# Patient Record
Sex: Male | Born: 1955 | Race: White | Hispanic: No | Marital: Married | Smoking: Former smoker
Health system: Southern US, Community
[De-identification: ages and names within clinical notes are randomized; demographics above are authoritative.]

## PROBLEM LIST (undated history)

## (undated) DIAGNOSIS — I350 Nonrheumatic aortic (valve) stenosis: Secondary | ICD-10-CM

## (undated) DIAGNOSIS — I509 Heart failure, unspecified: Secondary | ICD-10-CM

## (undated) DIAGNOSIS — I2510 Atherosclerotic heart disease of native coronary artery without angina pectoris: Secondary | ICD-10-CM

## (undated) DIAGNOSIS — Q2381 Bicuspid aortic valve: Secondary | ICD-10-CM

## (undated) DIAGNOSIS — Z951 Presence of aortocoronary bypass graft: Secondary | ICD-10-CM

## (undated) DIAGNOSIS — Q231 Congenital insufficiency of aortic valve: Secondary | ICD-10-CM

## (undated) DIAGNOSIS — R0602 Shortness of breath: Secondary | ICD-10-CM

## (undated) DIAGNOSIS — R011 Cardiac murmur, unspecified: Secondary | ICD-10-CM

## (undated) DIAGNOSIS — R911 Solitary pulmonary nodule: Secondary | ICD-10-CM

## (undated) DIAGNOSIS — I10 Essential (primary) hypertension: Secondary | ICD-10-CM

## (undated) DIAGNOSIS — M1990 Unspecified osteoarthritis, unspecified site: Secondary | ICD-10-CM

## (undated) DIAGNOSIS — Z953 Presence of xenogenic heart valve: Secondary | ICD-10-CM

## (undated) DIAGNOSIS — K8020 Calculus of gallbladder without cholecystitis without obstruction: Secondary | ICD-10-CM

## (undated) DIAGNOSIS — I499 Cardiac arrhythmia, unspecified: Secondary | ICD-10-CM

## (undated) DIAGNOSIS — E781 Pure hyperglyceridemia: Secondary | ICD-10-CM

## (undated) DIAGNOSIS — G453 Amaurosis fugax: Secondary | ICD-10-CM

## (undated) DIAGNOSIS — K819 Cholecystitis, unspecified: Secondary | ICD-10-CM

## (undated) DIAGNOSIS — R079 Chest pain, unspecified: Secondary | ICD-10-CM

## (undated) HISTORY — DX: Calculus of gallbladder without cholecystitis without obstruction: K80.20

## (undated) HISTORY — PX: CHOLECYSTECTOMY: SHX55

## (undated) HISTORY — DX: Bicuspid aortic valve: Q23.81

## (undated) HISTORY — DX: Cholecystitis, unspecified: K81.9

## (undated) HISTORY — DX: Atherosclerotic heart disease of native coronary artery without angina pectoris: I25.10

## (undated) HISTORY — PX: OTHER SURGICAL HISTORY: SHX169

## (undated) HISTORY — DX: Nonrheumatic aortic (valve) stenosis: I35.0

## (undated) HISTORY — DX: Amaurosis fugax: G45.3

## (undated) HISTORY — DX: Unspecified osteoarthritis, unspecified site: M19.90

## (undated) HISTORY — DX: Pure hyperglyceridemia: E78.1

## (undated) HISTORY — DX: Essential (primary) hypertension: I10

## (undated) HISTORY — PX: NOSE SURGERY: SHX723

## (undated) HISTORY — DX: Chest pain, unspecified: R07.9

## (undated) HISTORY — DX: Solitary pulmonary nodule: R91.1

## (undated) HISTORY — DX: Shortness of breath: R06.02

## (undated) HISTORY — DX: Congenital insufficiency of aortic valve: Q23.1

---

## 2004-01-13 ENCOUNTER — Ambulatory Visit (HOSPITAL_COMMUNITY): Admission: RE | Admit: 2004-01-13 | Discharge: 2004-01-13 | Payer: Self-pay | Admitting: General Surgery

## 2004-01-13 ENCOUNTER — Ambulatory Visit (HOSPITAL_BASED_OUTPATIENT_CLINIC_OR_DEPARTMENT_OTHER): Admission: RE | Admit: 2004-01-13 | Discharge: 2004-01-13 | Payer: Self-pay | Admitting: General Surgery

## 2005-02-27 ENCOUNTER — Ambulatory Visit (HOSPITAL_COMMUNITY): Admission: RE | Admit: 2005-02-27 | Discharge: 2005-02-27 | Payer: Self-pay | Admitting: *Deleted

## 2010-10-09 DIAGNOSIS — G453 Amaurosis fugax: Secondary | ICD-10-CM

## 2010-10-09 HISTORY — DX: Amaurosis fugax: G45.3

## 2011-05-10 DEATH — deceased

## 2011-06-17 DIAGNOSIS — G453 Amaurosis fugax: Secondary | ICD-10-CM | POA: Insufficient documentation

## 2011-07-05 ENCOUNTER — Ambulatory Visit (HOSPITAL_COMMUNITY)
Admission: RE | Admit: 2011-07-05 | Discharge: 2011-07-05 | Disposition: A | Discharge status: Home / Self Care | Payer: BC Managed Care – PPO | Source: Ambulatory Visit | Attending: Cardiology | Admitting: Cardiology

## 2011-07-05 DIAGNOSIS — I359 Nonrheumatic aortic valve disorder, unspecified: Secondary | ICD-10-CM | POA: Insufficient documentation

## 2011-07-05 DIAGNOSIS — Q231 Congenital insufficiency of aortic valve: Secondary | ICD-10-CM | POA: Insufficient documentation

## 2011-07-05 DIAGNOSIS — Q2111 Secundum atrial septal defect: Secondary | ICD-10-CM | POA: Insufficient documentation

## 2011-07-05 DIAGNOSIS — Q211 Atrial septal defect: Secondary | ICD-10-CM | POA: Insufficient documentation

## 2013-07-08 ENCOUNTER — Other Ambulatory Visit: Payer: Self-pay | Admitting: Cardiology

## 2013-07-08 DIAGNOSIS — Q231 Congenital insufficiency of aortic valve: Secondary | ICD-10-CM

## 2013-07-16 ENCOUNTER — Other Ambulatory Visit: Payer: Self-pay

## 2013-07-16 ENCOUNTER — Institutional Professional Consult (permissible substitution) (INDEPENDENT_AMBULATORY_CARE_PROVIDER_SITE_OTHER): Payer: BC Managed Care – PPO | Admitting: Thoracic Surgery (Cardiothoracic Vascular Surgery)

## 2013-07-16 ENCOUNTER — Encounter: Payer: Self-pay | Admitting: Thoracic Surgery (Cardiothoracic Vascular Surgery)

## 2013-07-16 VITALS — BP 132/82 | HR 90 | Resp 20 | Ht 70.0 in | Wt 200.0 lb

## 2013-07-16 DIAGNOSIS — M1990 Unspecified osteoarthritis, unspecified site: Secondary | ICD-10-CM

## 2013-07-16 DIAGNOSIS — I350 Nonrheumatic aortic (valve) stenosis: Secondary | ICD-10-CM | POA: Insufficient documentation

## 2013-07-16 DIAGNOSIS — R079 Chest pain, unspecified: Secondary | ICD-10-CM | POA: Insufficient documentation

## 2013-07-16 DIAGNOSIS — I10 Essential (primary) hypertension: Secondary | ICD-10-CM

## 2013-07-16 DIAGNOSIS — E781 Pure hyperglyceridemia: Secondary | ICD-10-CM

## 2013-07-16 DIAGNOSIS — Q231 Congenital insufficiency of aortic valve: Secondary | ICD-10-CM | POA: Insufficient documentation

## 2013-07-16 DIAGNOSIS — R0602 Shortness of breath: Secondary | ICD-10-CM

## 2013-07-16 DIAGNOSIS — G453 Amaurosis fugax: Secondary | ICD-10-CM

## 2013-07-16 DIAGNOSIS — H3400 Transient retinal artery occlusion, unspecified eye: Secondary | ICD-10-CM

## 2013-07-16 DIAGNOSIS — I359 Nonrheumatic aortic valve disorder, unspecified: Secondary | ICD-10-CM

## 2013-07-16 NOTE — H&P (Signed)
[address]       [name] [address]             [phone]     CARDIOTHORACIC SURGERY CONSULTATION REPORT  Referring Provider is Donato Schultz, MD PCP is FRIED, Doris Cheadle, MD  Chief Complaint  Patient presents with  . Aortic Stenosis    Surgical eval on severe AS, ECHO 07/02/13, Cardiac cath scheduled for 08/01/13    HPI:  Patient is a 57 year old white male from [place] with known history of bicuspid aortic valve and aortic stenosis who has been referred for elective surgical consultation. The patient states that he was first noted to have a heart murmur on physical exam approximately 15 years ago when he presented with symptomatic cholelithiasis. He was diagnosed with a bicuspid aortic valve at that time and he subsequently underwent elective cholecystectomy without complication. He has been followed for the last several years by Dr. Anne Fu with serial echocardiograms. Over the last few years the patient has began to experience exertional shortness of breath and chest discomfort. A followup echocardiogram was performed 07/02/2013 that reportedly demonstrates the presence of severe aortic stenosis with peak velocity across the aortic valve measured 4.18 m/s corresponding to estimated mean transvalvular gradient of 42 millimeters mercury. Left ventricular systolic function remains normal with ejection fraction estimated 60-65%. There was mild aortic root enlargement with the sinotubular junction measured 4.2 cm. There was mild aortic regurgitation and severe calcification of the aortic valve. The severity of aortic stenosis had notably progressed significantly since the last previous echocardiogram performed in September 2013. The patient has been referred for elective surgical consultation. Left and right heart catheterization has been scheduled for later this month.  The patient describes a gradual onset of symptoms of exertional shortness of breath and chest  tightness. He states that the symptoms occur only with relatively strenuous exertion or walking up a flight of stairs. He denies any episodes of shortness of breath or chest discomfort occurring at rest or with normal activity. He denies any nocturnal shortness of breath, orthopnea, PND, tachypalpitations, or lower extremity edema. He has had occasional mild dizzy spells without syncope.  Past Medical History  Diagnosis Date  . Calculus of gallbladder   . Cholecystitis   . Osteoarthritis   . Hypertension   . Aortic stenosis   . Hypertriglyceridemia   . Bicuspid aortic valve   . Amaurosis fugax   . Chest pain on exertion   . Exertional shortness of breath   . Amaurosis fugax 2012    Past Surgical History  Procedure Laterality Date  . Umbillical hernia    . Cholecystectomy    . Nose surgery      Family History  Problem Relation Age of Onset  . CAD Father     MI  . Alzheimer's disease Mother   . Arthritis-Osteo Brother   . Allergic rhinitis Sister   . Colon polyps Daughter     History   Social History  . Marital Status: Married    Spouse Name: N/A    Number of Children: N/A  . Years of Education: N/A   Occupational History  . Not on file.   Social History Main Topics  . Smoking status: Former Smoker    Quit date: 10/09/1992  . Smokeless tobacco: Former Neurosurgeon     Comment: chewed on cigars  . Alcohol Use: No  . Drug Use: No  . Sexual Activity: Not on file  Other Topics Concern  . Not on file   Social History Narrative  . No narrative on file    Current Outpatient Prescriptions  Medication Sig Dispense Refill  . aspirin EC 81 MG tablet Take 81 mg by mouth daily.      . fluticasone (FLONASE) 50 MCG/ACT nasal spray Place 2 sprays into the nose daily.      . hydrochlorothiazide (HYDRODIURIL) 25 MG tablet Take 25 mg by mouth daily.      . Omega-3 Fatty Acids (FISH OIL) 1200 MG CAPS Take by mouth.       No current facility-administered medications for this  visit.    No Known Allergies    Review of Systems:   General:  normal appetite, decreased energy, no weight gain, no weight loss, no fever  Cardiac:  + chest pain with exertion, no chest pain at rest, + SOB with exertion, no resting SOB, no PND, no orthopnea, occasional palpitations, no arrhythmia, no atrial fibrillation, no LE edema, + dizzy spells, no syncope  Respiratory:  exertional shortness of breath, no home oxygen, no productive cough, no dry cough, no bronchitis, no wheezing, no hemoptysis, no asthma, no pain with inspiration or cough, no sleep apnea, no CPAP at night  GI:   no difficulty swallowing, no reflux, no frequent heartburn, no hiatal hernia, no abdominal pain, no constipation, no diarrhea, no hematochezia, no hematemesis, no melena, + intermittent pain and blood in stool from anal fissure  GU:   no dysuria,  no frequency, no urinary tract infection, no hematuria, no enlarged prostate, no kidney stones, no kidney disease  Vascular:  no pain suggestive of claudication, no pain in feet, no leg cramps, no varicose veins, no DVT, no non-healing foot ulcer  Neuro:   no stroke, + TIA's, no seizures, no headaches, + temporary blindness one eye,  no slurred speech, no peripheral neuropathy, mild chronic pain in back, no instability of gait, no memory/cognitive dysfunction  Musculoskeletal: + arthritis, no joint swelling, + myalgias, no difficulty walking, normal mobility   Skin:   no rash, no itching, no skin infections, no pressure sores or ulcerations  Psych:   no anxiety, no depression, no nervousness, no unusual recent stress  Eyes:   no blurry vision, no floaters, no recent vision changes, + wears glasses or contacts  ENT:   no hearing loss, no loose or painful teeth, no dentures, last saw dentist within the past 6 months  Hematologic:  no easy bruising, no abnormal bleeding, no clotting disorder, no frequent epistaxis  Endocrine:  no diabetes, does not check CBG's at  home     Physical Exam:   BP 132/82  Pulse 90  Resp 20  Ht 5\' 10"  (1.778 m)  Wt 200 lb (90.719 kg)  BMI 28.7 kg/m2  SpO2 98%  General:    well-appearing  HEENT:  Unremarkable   Neck:   no JVD, no bruits, no adenopathy   Chest:   clear to auscultation, symmetrical breath sounds, no wheezes, no rhonchi   CV:   RRR, grade IV/VI crescendo/decrescendo systolic murmur   Abdomen:  soft, non-tender, no masses   Extremities:  warm, well-perfused, pulses palpable, no LE edema  Rectal/GU  Deferred  Neuro:   Grossly non-focal and symmetrical throughout  Skin:   Clean and dry, no rashes, no breakdown   Diagnostic Tests:  TRANSTHORACIC ECHOCARDIOGRAM  Report from transthoracic echocardiogram performed at [facility] cardiology on 07/02/2013 is reviewed. Images from this examination are not currently  available for review. By report there was severe aortic stenosis with peak velocity across the aortic valve measured 4.18 m/s. The mean transvalvular gradient was estimated 42 millimeters mercury.  There was mild aortic valve regurgitation with severe calcification of the aortic valve. There was mild aortic root dilatation with the sinotubular junction measured 4.2 cm. There was mild asymmetric septal hypertrophy. Left ventricular systolic function was normal with ejection fraction estimated 60-65%.   Impression:  Bicuspid aortic valve with severe symptomatic aortic stenosis and preserved left ventricular systolic function. I agree that the patient would best be treated with surgical intervention in the near future. Diagnostic cardiac catheterization has been scheduled and will impact surgical options. The patient needs CT angiogram of the chest to formally evaluate the size of the ascending thoracic aorta and aortic root. CT angiogram of the abdomen and pelvis will be useful if the patient is to be considered candidate for minimally invasive approach for surgery.   Plan:  The patient was counseled at  length regarding surgical alternatives with respect to valve replacement including continued medical therapy versus proceeding with conventional surgical aortic valve replacement using either a mechanical prosthesis or a bioprosthetic tissue valve.  Other alternatives including the Ross autograft procedure, homograft aortic root replacement, stentless bioprosthetic tissue valve replacement, valve repair, and transcatheter aortic valve replacement were discussed.  Discussion was held comparing the relative risks of mechanical valve replacement with need for lifelong anticoagulation versus use of a bioprosthetic tissue valve and the associated potential for late structural valve deterioration in failure.  Alternative surgical approaches have been discussed, including a comparison between conventional sternotomy and minimally-invasive techniques.  The relative risks and benefits of each have been reviewed as they pertain to the patient's specific circumstances, and all of their questions have been addressed.  Patient will return in 3 weeks after cardiac catheterization and CT angiogram had been completed to make definitive plans for surgery in the near future.       Salvatore Decent. Cornelius Moras, MD 07/16/2013 5:17 PM

## 2013-07-17 ENCOUNTER — Other Ambulatory Visit: Payer: Self-pay | Admitting: *Deleted

## 2013-07-17 DIAGNOSIS — R0602 Shortness of breath: Secondary | ICD-10-CM

## 2013-07-17 DIAGNOSIS — I350 Nonrheumatic aortic (valve) stenosis: Secondary | ICD-10-CM

## 2013-07-23 ENCOUNTER — Ambulatory Visit
Admission: RE | Admit: 2013-07-23 | Discharge: 2013-07-23 | Disposition: A | Discharge status: Home / Self Care | Payer: BC Managed Care – PPO | Source: Ambulatory Visit | Attending: Surgery | Admitting: Surgery

## 2013-07-23 DIAGNOSIS — R911 Solitary pulmonary nodule: Secondary | ICD-10-CM | POA: Insufficient documentation

## 2013-07-23 DIAGNOSIS — I350 Nonrheumatic aortic (valve) stenosis: Secondary | ICD-10-CM

## 2013-07-23 DIAGNOSIS — R0602 Shortness of breath: Secondary | ICD-10-CM

## 2013-07-23 HISTORY — DX: Solitary pulmonary nodule: R91.1

## 2013-07-23 MED ORDER — IOHEXOL 350 MG/ML SOLN
75.0000 mL | Freq: Once | INTRAVENOUS | Status: AC | PRN
  Administered 2013-07-23: 75 mL via INTRAVENOUS

## 2013-07-25 ENCOUNTER — Other Ambulatory Visit: Payer: BC Managed Care – PPO

## 2013-07-28 ENCOUNTER — Other Ambulatory Visit (INDEPENDENT_AMBULATORY_CARE_PROVIDER_SITE_OTHER): Payer: BC Managed Care – PPO

## 2013-07-28 ENCOUNTER — Telehealth: Payer: Self-pay | Admitting: Cardiology

## 2013-07-28 ENCOUNTER — Encounter (HOSPITAL_COMMUNITY): Payer: Self-pay | Admitting: Pharmacy Technician

## 2013-07-28 DIAGNOSIS — Q231 Congenital insufficiency of aortic valve: Secondary | ICD-10-CM

## 2013-07-28 LAB — BASIC METABOLIC PANEL
BUN: 17 mg/dL (ref 6–23)
CO2: 30 mEq/L (ref 19–32)
Calcium: 9.6 mg/dL (ref 8.4–10.5)
Creatinine, Ser: 1.1 mg/dL (ref 0.4–1.5)
Potassium: 3.4 mEq/L — ABNORMAL LOW (ref 3.5–5.1)

## 2013-07-28 LAB — PROTIME-INR: INR: 1 ratio (ref 0.8–1.0)

## 2013-07-28 LAB — CBC: Platelets: 231 10*3/uL (ref 150.0–400.0)

## 2013-07-28 NOTE — Telephone Encounter (Signed)
New Problem:  Pt's wife states she wants to know when her husband is suppose to stop taking aspirin and HTZTZ... Prior to his cath this firday. Please advise

## 2013-07-28 NOTE — Telephone Encounter (Signed)
Advised patients spouse it okay to for the patient to take the Aspirin and HCTZ the morning of the Cath.

## 2013-07-29 ENCOUNTER — Other Ambulatory Visit: Payer: Self-pay | Admitting: Cardiology

## 2013-07-29 ENCOUNTER — Telehealth: Payer: Self-pay | Admitting: Cardiology

## 2013-07-29 MED ORDER — POTASSIUM CHLORIDE CRYS ER 20 MEQ PO TBCR: 20.0000 meq | EXTENDED_RELEASE_TABLET | Freq: Every day | ORAL | Status: DC

## 2013-07-29 NOTE — Telephone Encounter (Signed)
Advised patient of lab results ...Marland KitchenMarland KitchenCalled new Rx in for Klor-con 20 meq daily. Stopped HCTZ.

## 2013-07-31 NOTE — H&P (Signed)
History of Present Illness  General:  57 year old male with progression of aortic stenosis to severe, bicuspid aortic valve status post TEE 9/12, hypertension, family history of early coronary artery disease, Prior smoker, prior episode of left amaurosis fugax (9/12).   ECHO 07/02/13 - Severe AS as described below. Advanced. He is symptomatic with angina today with carrying tools. Felt like he was going to pass out recently. Also felt increased dyspnea with exertional activity. No prior MI, no prior heart cath. Father had CABG. .    Current Medications  Taking Aspir-81 81 MG Tablet Delayed Release 1 tablet Once a day  Taking Fluticasone Propionate 50 MCG/ACT Suspension 2 sprays in each nostril when he remembers  Taking Hydrochlorothiazide 25 MG Tablet 1 tablet Once a day  Taking Fish Oil 1200 mg Capsule 2 capsules once a day  Medication List reviewed and reconciled with the patient   Past Medical History  Calculus of gallbladder with acute cholecystitis, without mention of obstruction  Osteoarthritis  Hypertension   Surgical History  umbillical hernia   cholecystectomy   nose operation when he was 57 years old    Family History  Father: deceased 68 yrs, myocardial infarction, diagnosed with CAD  Mother: alzheimer's dementia  Brother 1: alive, DDD neck and back  Sister 1: alive, sinus problems  Daughter(s): adenomatous colon polyps at age 23  no family hx colon cancer or liver disease.   Social History  General:  Tobacco use cigarettes: Former smoker, Quit in year stopped smoking cigars 20 years ago, Pack-year Hx: smoked cigars only-did not inhale x a few years.  no Smoking.  no Tobacco Exposure.  no Alcohol.  Caffeine: yes, 2+ servings daily, coffee.  no Recreational drug use.  no Exercise.  Occupation: Personnel officer.  Education: [employer].  Marital Status: married.  Children: 2, girls.  Seat belt use: yes.    Allergies  N.K.D.A.   Hospitalization/Major Diagnostic  Procedure  surgery    Review of Systems  No syncope but dizziness, no fevers, no cough. No bleeding. , as noted in HPI, all other systems negative.   Vital Signs  Wt 204, Wt change 2 lb, Ht 70, BMI 29.27, Pulse sitting 74, BP sitting 138/82.   Past Orders  Imaging:EC Echocardiogram (Order Date - 07/02/2013)  Result: Severe AS, bicuspid  Notes: 1. Mild asymmetric septal hypertrophy. 2. Left ventricular ejection fraction estimated by 2D at 60-65 percent. 3. There is basal inferoseptal wall akinesis. 4. There is mild aortic root dilatation. 4.2cm sinotubular junction. 5. Mildly thickened mitral valve. 6. Mild mitral annular calcification. 7. Trace mitral valve regurgitation. 8. Severe calcification of the aortic valve. 9. Mild aortic valve regurgitation. 10. Severe aortic valve stenosis. Peak velocity is 4.9m/s, Mean gradient is . 11. Analysis of mitral valve inflow, pulmonary vein Doppler and tissue Doppler suggests grade I diastolic dysfunction without elevated left atrial pressure. 12. AS has progressed since 9/13 from moderate to severe. Canyon Lohr 07/04/2013 08:34:14 AM > will discuss in follow up. Edwards,William 07/04/2013 04:39:51 PM > Echo report charted.   Examination  Cardiology, General: GENERAL APPEARANCE: pleasant, NAD.  CAROTID UPSTROKE: Radiation of murmur bilaterally.  JVD: flat.  HEART SOUNDS: regular, normal S1, S2, no S3 or S4.  MURMUR: 3/6 mid peaking systolic murmur.  LUNGS: no rales or wheezes.  EXTREMITIES: no leg edema.     Assessments   1. Bicuspid aortic valve - 746.4 (Primary)   2. Hypertension, essential - 401.1   3. Hypertriglyceridemia - 272.1   4.  Aortic stenosis - 424.1   5. Angina effort - 413.9   6. Dyspnea - 786.09   Treatment  1. Bicuspid aortic valve  Notes: With his symptomatic severe bicuspid aortic valve stenosis, class 1 indication for surgery. Will refer to [facility]. Discussed. I will set up also for [facility]. , Risks and benefits  of cardiac catheterization have been reviewed including risk of stroke, heart attack, death, bleeding, renal impariment and arterial damage. There was ample oppurtuny to answer questions. Alternatives were discussed. Patient understands and wishes to proceed. Without aortic valve replacement, increased mortality.    2. Hypertension, essential  IMAGING: EKG     Notes :Overton,Shana 07/07/2013 03:48:57 PM >    Procedure Codes  78469 EKG I AND R   Follow Up  post cath/op

## 2013-08-01 ENCOUNTER — Encounter: Payer: Self-pay | Admitting: Thoracic Surgery (Cardiothoracic Vascular Surgery)

## 2013-08-01 ENCOUNTER — Ambulatory Visit (INDEPENDENT_AMBULATORY_CARE_PROVIDER_SITE_OTHER): Payer: BC Managed Care – PPO | Admitting: Thoracic Surgery (Cardiothoracic Vascular Surgery)

## 2013-08-01 ENCOUNTER — Encounter (HOSPITAL_COMMUNITY)
Admission: RE | Disposition: A | Discharge status: Home / Self Care | Payer: Self-pay | Source: Ambulatory Visit | Attending: Cardiology

## 2013-08-01 ENCOUNTER — Ambulatory Visit (HOSPITAL_COMMUNITY)
Admission: RE | Admit: 2013-08-01 | Discharge: 2013-08-01 | Disposition: A | Discharge status: Home / Self Care | Payer: BC Managed Care – PPO | Source: Ambulatory Visit | Attending: Cardiology | Admitting: Cardiology

## 2013-08-01 ENCOUNTER — Other Ambulatory Visit: Payer: Self-pay | Admitting: *Deleted

## 2013-08-01 VITALS — BP 133/80 | HR 73 | Resp 16 | Ht 70.0 in | Wt 200.0 lb

## 2013-08-01 DIAGNOSIS — I10 Essential (primary) hypertension: Secondary | ICD-10-CM | POA: Insufficient documentation

## 2013-08-01 DIAGNOSIS — R911 Solitary pulmonary nodule: Secondary | ICD-10-CM

## 2013-08-01 DIAGNOSIS — R079 Chest pain, unspecified: Secondary | ICD-10-CM

## 2013-08-01 DIAGNOSIS — I350 Nonrheumatic aortic (valve) stenosis: Secondary | ICD-10-CM | POA: Diagnosis present

## 2013-08-01 DIAGNOSIS — I2510 Atherosclerotic heart disease of native coronary artery without angina pectoris: Secondary | ICD-10-CM | POA: Insufficient documentation

## 2013-08-01 DIAGNOSIS — Q231 Congenital insufficiency of aortic valve: Secondary | ICD-10-CM | POA: Insufficient documentation

## 2013-08-01 DIAGNOSIS — I359 Nonrheumatic aortic valve disorder, unspecified: Secondary | ICD-10-CM

## 2013-08-01 DIAGNOSIS — Z8249 Family history of ischemic heart disease and other diseases of the circulatory system: Secondary | ICD-10-CM | POA: Insufficient documentation

## 2013-08-01 DIAGNOSIS — I2584 Coronary atherosclerosis due to calcified coronary lesion: Secondary | ICD-10-CM | POA: Insufficient documentation

## 2013-08-01 HISTORY — PX: LEFT AND RIGHT HEART CATHETERIZATION WITH CORONARY ANGIOGRAM: SHX5449

## 2013-08-01 HISTORY — DX: Atherosclerotic heart disease of native coronary artery without angina pectoris: I25.10

## 2013-08-01 LAB — POTASSIUM: Potassium: 3.9 mEq/L (ref 3.5–5.1)

## 2013-08-01 SURGERY — LEFT AND RIGHT HEART CATHETERIZATION WITH CORONARY ANGIOGRAM
Anesthesia: LOCAL

## 2013-08-01 MED ORDER — FENTANYL CITRATE 0.05 MG/ML IJ SOLN
INTRAMUSCULAR | Status: AC
  Filled 2013-08-01: qty 2

## 2013-08-01 MED ORDER — LIDOCAINE HCL (PF) 1 % IJ SOLN
INTRAMUSCULAR | Status: AC
  Filled 2013-08-01: qty 30

## 2013-08-01 MED ORDER — SODIUM CHLORIDE 0.9 % IV SOLN: 250.0000 mL | INTRAVENOUS | Status: DC | PRN

## 2013-08-01 MED ORDER — HEPARIN SODIUM (PORCINE) 1000 UNIT/ML IJ SOLN
INTRAMUSCULAR | Status: AC
  Filled 2013-08-01: qty 1

## 2013-08-01 MED ORDER — SODIUM CHLORIDE 0.9 % IV SOLN: 1.0000 mL/kg/h | INTRAVENOUS | Status: DC

## 2013-08-01 MED ORDER — VERAPAMIL HCL 2.5 MG/ML IV SOLN
INTRAVENOUS | Status: AC
  Filled 2013-08-01: qty 2

## 2013-08-01 MED ORDER — ACETAMINOPHEN 325 MG PO TABS: 650.0000 mg | ORAL_TABLET | ORAL | Status: DC | PRN

## 2013-08-01 MED ORDER — SODIUM CHLORIDE 0.9 % IJ SOLN: 3.0000 mL | Freq: Two times a day (BID) | INTRAMUSCULAR | Status: DC

## 2013-08-01 MED ORDER — SODIUM CHLORIDE 0.9 % IJ SOLN
3.0000 mL | INTRAMUSCULAR | Status: DC | PRN
  Administered 2013-08-01: 3 mL via INTRAVENOUS

## 2013-08-01 MED ORDER — MIDAZOLAM HCL 2 MG/2ML IJ SOLN
INTRAMUSCULAR | Status: AC
  Filled 2013-08-01: qty 2

## 2013-08-01 MED ORDER — ONDANSETRON HCL 4 MG/2ML IJ SOLN: 4.0000 mg | Freq: Four times a day (QID) | INTRAMUSCULAR | Status: DC | PRN

## 2013-08-01 MED ORDER — SODIUM CHLORIDE 0.9 % IV SOLN
INTRAVENOUS | Status: DC
  Administered 2013-08-01: 08:00:00 via INTRAVENOUS

## 2013-08-01 MED ORDER — NITROGLYCERIN 0.2 MG/ML ON CALL CATH LAB
INTRAVENOUS | Status: AC
  Filled 2013-08-01: qty 1

## 2013-08-01 MED ORDER — HEPARIN (PORCINE) IN NACL 2-0.9 UNIT/ML-% IJ SOLN
INTRAMUSCULAR | Status: AC
  Filled 2013-08-01: qty 1000

## 2013-08-01 MED ORDER — ASPIRIN 81 MG PO CHEW
81.0000 mg | CHEWABLE_TABLET | ORAL | Status: AC
  Administered 2013-08-01: 81 mg via ORAL
  Filled 2013-08-01: qty 1

## 2013-08-01 NOTE — Interval H&P Note (Signed)
History and Physical Interval Note:  08/01/2013 8:43 AM  Jeff[place] [name]  has presented today for surgery, with the diagnosis of Chest pain  The various methods of treatment have been discussed with the patient and family. After consideration of risks, benefits and other options for treatment, the patient has consented to  Procedure(s): LEFT AND RIGHT HEART CATHETERIZATION WITH CORONARY ANGIOGRAM (N/A) as a surgical intervention .  The patient's history has been reviewed, patient examined, no change in status, stable for surgery.  I have reviewed the patient's chart and labs.  Questions were answered to the patient's satisfaction.     Amory Zbikowski

## 2013-08-01 NOTE — Patient Instructions (Signed)
Stop taking fish oil capsules  Avoid any heavy lifting or strenuous physical activity between now and the time of surgery

## 2013-08-01 NOTE — Progress Notes (Signed)
[address]       [name] [address]             [phone]     CARDIOTHORACIC SURGERY OFFICE NOTE  Referring Provider is Donato Schultz, MD PCP is FRIED, Doris Cheadle, MD   HPI:  Patient returns for followup of bicuspid aortic valve with severe symptomatic aortic stenosis. He was originally seen in consultation on 07/16/2013.  Since then he underwent CT angiogram of the chest on 07/23/2013, and he underwent left and right heart catheterization earlier today by Dr. Anne Fu. He returns to the office today to discuss results of these tests. He reports no new problems or complaints.  No current facility-administered medications for this visit.   No current outpatient prescriptions on file.   Facility-Administered Medications Ordered in Other Visits  Medication Dose Route Frequency Provider Last Rate Last Dose  . 0.9 %  sodium chloride infusion  250 mL Intravenous PRN Donato Schultz, MD      . 0.9 %  sodium chloride infusion   Intravenous Continuous Donato Schultz, MD 75 mL/hr at 08/01/13 0749    . sodium chloride 0.9 % injection 3 mL  3 mL Intravenous Q12H Donato Schultz, MD      . sodium chloride 0.9 % injection 3 mL  3 mL Intravenous PRN Donato Schultz, MD   3 mL at 08/01/13 0749      Physical Exam:   BP 133/80  Pulse 73  Resp 16  Ht 5\' 10"  (1.778 m)  Wt 200 lb (90.719 kg)  BMI 28.7 kg/m2  SpO2 96%  General:  Well-appearing  Chest:   Clear to auscultation  CV:   Regular rate and rhythm with crescendo decrescendo systolic murmur  Incisions:  n/a  Abdomen:  Soft and nontender  Extremities:  Warm and well-perfused  Diagnostic Tests:  CT ANGIOGRAPHY CHEST, ABDOMEN AND PELVIS  TECHNIQUE:  Multidetector CT imaging through the chest, abdomen and pelvis was  performed using the standard protocol during bolus administration of  intravenous contrast. Multiplanar reconstructed images including  MIPs were obtained and reviewed to evaluate the vascular anatomy.    CONTRAST: 75mL OMNIPAQUE IOHEXOL 350 MG/ML SOLN  COMPARISON: None.  FINDINGS:  CTA CHEST FINDINGS  Ascending thoracic aorta measures up to 4 cm. Calcifications at the  aortic valve. No evidence for aortic dissection. The great vessels  are patent. Descending thoracic aorta measures 2.5 cm. There are  small mediastinal lymph nodes. Overall, there is no significant  chest lymphadenopathy. Heart size is normal.  The trachea and mainstem bronchi are patent. Peripheral 5 mm nodule  in the left lower lobe on sequence 5, image 47. There is a 4 mm  pleural-based nodule along the right major fissure on image 33. No  significant consolidation or airspace disease. No acute bone  abnormality.  Review of the MIP images confirms the above findings.  CTA ABDOMEN AND PELVIS FINDINGS  No evidence for free air. Low-attenuation of the liver suggests  hepatic steatosis. The gallbladder has been removed. No gross  abnormality to the pancreas, spleen, adrenal glands or kidneys. No  free fluid or lymphadenopathy. No gross abnormality to the prostate  or urinary bladder. Few colonic diverticula without acute colonic  inflammation. Normal appearance of the small and large bowel. No  acute bone abnormality. Degenerative disc disease at L5-S1.  Arterial structures: The abdominal aorta is patent without aneurysm  or dissection. The celiac trunk, SMA and IMA are widely patent.  Bilateral renal arteries are patent. Incidentally, there is an  accessory left renal artery which originates near the main left  renal artery. The common iliac arteries are torturous without  dilatation or dissection. Right common iliac artery measures 1 cm in  diameter. Left common iliac artery measures 1.3 cm in diameter. The  internal and external iliac arteries are widely patent bilaterally.  The proximal femoral arteries are widely patent bilaterally. No  significant atherosclerotic disease.  Review of the MIP images confirms the  above findings.  IMPRESSION:  CTA chest: No acute chest abnormality.  Mild dilatation of the ascending thoracic aorta measuring 4 cm. No  evidence for aortic dissection.  Calcifications involving the aortic valve.  There are 2 pulmonary nodules. The largest measures 5 mm. If the  patient is at high risk for bronchogenic carcinoma, follow-up chest  CT at 6-12 months is recommended. If the patient is at low risk for  bronchogenic carcinoma, follow-up chest CT at 12 months is  recommended. This recommendation follows the consensus statement:  Guidelines for Management of Small Pulmonary Nodules Detected on CT  Scans: A Statement from the Fleischner Society as published in  Radiology 2005;237:395-400.  CTA abdomen and pelvis: Normal appearance of the arterial structures  without atherosclerotic disease or aneurysm.  No acute abnormality in the abdomen or pelvis.  Hepatic steatosis.  Electronically Signed  By: Richarda Overlie M.D.  On: 07/23/2013 15:51    CARDIAC CATHETERIZATION  PROCEDURE: Left heart catheterization with selective coronary angiography via the radial artery approach. Right heart catheterization with oxygen saturations via the right brachial vein approach.  INDICATIONS: 57 year old male with severe aortic stenosis, bicuspid aortic valve, dyspnea on exertion, chest pain. Mild aortic root enlargement sinotubular junction 4.2 cm, peak aortic valve transvalvular velocity 4.2 m/s. Mean gradient 42 mm mercury. Severity of aortic stenosis has increased significantly since last echocardiogram in September of 2013. Symptoms most notable are dyspnea on exertion and chest tightness when walking up flight of stairs for instance.  The risks, benefits, and details of the procedure were explained to the patient, including possibilities of stroke, heart attack, death, renal impairment, arterial damage, bleeding. The patient verbalized understanding and wanted to proceed. Informed written consent was  obtained.  PROCEDURE TECHNIQUE: Allen's test was performed pre-and post procedure and was normal. The right brachial vein site was prepped and draped in a sterile fashion after placement of a antecubital IV. 5 French sheath was placed after mild lidocaine to region. Right heart catheterization, 5 French balloon tipped catheter was performed without difficulty. Pressures and oxygen saturations were obtained. After, catheter removed. Sterile technique was maintained.  The right radial artery site was prepped and draped in a sterile fashion. One percent lidocaine was used for local anesthesia. Using the modified Seldinger technique a 5 French hydrophilic sheath was inserted into the radial artery without difficulty. 3 mg of verapamil was administered via the sheath. A Judkins right #4 catheter with the guidance of a Versicore wire was placed in the right coronary cusp and selectively cannulated the right coronary artery. After traversing the aortic arch, 4500 units of heparin IV was administered. A Judkins left #4 catheter was used to selectively cannulate the left main artery after the 3.5 catheter did not adequately select the left main artery. Multiple views with hand injection of Omnipaque were obtained. I was unable to cross the aortic valve after multiple attempts with Judkins right catheter. Following the procedure, sheath was removed, patient was hemodynamically stable, hemostasis was maintained  with a Terumo T band.  CONTRAST: Total of 50 ml.  FLOUROSCOPY TIME: 7.6 min.  COMPLICATIONS: None.  HEMODYNAMICS: Aortic pressure was 124/80 mmHg; LV systolic pressure was not performed.  Pulmonary artery saturation 68%  Radial artery saturation 94%  Cardiac output 4.9 L, index 2.36.  Right atrial pressure 13/12/10  Right ventricular pressure 33/7/12  Pulmonary artery pressure 36/17/24  Pulmonary capillary wedge pressure 17/18/15  ANGIOGRAPHIC DATA:  Left main: Large left main, branching into LAD and  circumflex artery. No angiographically significant disease.  Left anterior descending (LAD): At the juncture of the second septal branch off of the LAD, there is a hazy plaque, ulcerated with stenosis of approximately 70%.  Circumflex artery (CIRC): Large circumflex system with 2 significant obtuse marginal branches, large in caliber with no angiographically significant disease present.  Right coronary artery (RCA): Dominant vessel giving rise to the posterior descending artery. There's a small mild luminal irregularity in the distal segment at the juncture of the posterior descending artery with stenosis of approximately 10%. Otherwise no angiographically significant disease present. There was no catheter dampening, no significant ostial stenosis noted on RAO cranial view.  LEFT VENTRICULOGRAM: Not performed. Aortic valve appears highly calcified.  IMPRESSIONS:  1. 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 Otherwise no angiographically significant/flow limiting coronary artery disease present. 2. Right heart catheterization reveals upper normal/minimally elevated pulmonary pressures. 3. Normal cardiac output. RECOMMENDATION: I will forward results to Dr. Cornelius Moras. We will need to contemplate single-vessel bypass with LIMA to LAD. Bicuspid aortic valve, echocardiogram with severe aortic stenosis. Symptomatic. Proceed with surgery. Discussed with patient.       Impression:  Patient has bicuspid aortic valve with severe symptomatic aortic stenosis and preserved left ventricular systolic function. 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 There is otherwise no significant coronary artery disease. Pulmonary artery pressures are very slightly elevated. 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  Based upon these findings I feel the patient would best be treated with conventional surgical aortic valve replacement with coronary artery bypass grafting including left internal mammary artery to the distal left anterior descending coronary artery. Although it is conceivable that the patient could undergo surgery using minimally invasive techniques with hybrid PCI and stenting of the left anterior descending coronary artery, I would favor the more conservative approach with left internal mammary artery grafting the a full conventional sternotomy.  The patient was also incidentally noted to have 2 very small, benign-appearing pulmonary nodules.      Plan:  I've discussed the results of all these tests with the patient and his family in the office today. We plan to proceed with aortic valve replacement and single-vessel coronary artery bypass grafting on Wednesday, 08/20/2013. The patient specifically requests that his valve be replaced using a bioprosthetic tissue valve.  The patient understands and accepts all potential associated risks of surgery including but not limited to risk of death, stroke, myocardial infarction, congestive heart failure, respiratory failure, renal failure, pneumonia, bleeding requiring blood transfusion and or reexploration, arrhythmia, heart block or bradycardia requiring permanent pacemaker, aortic dissection or other major vascular complication, pleural effusions or other delayed complications related to continued congestive heart failure, and other late complications related to valve replacement including structural valve deterioration and failure, thrombosis, endocarditis, or paravalvular leak.  I've instructed the patient to stop taking fish  oil capsules between now and the time of surgery. He will otherwise continue all of his current medications without change. The patient has also been of advised to avoid any sort of heavy lifting or strenuous physical exertion between now  and the time of surgery. We also discussed the 2 small pulmonary nodules discovered incidentally on chest CT scan. We will plan followup CT scan in one year because the patient is relatively low risk for the development of lung cancer.  All of his questions been addressed.     Salvatore Decent. Cornelius Moras, MD 08/01/2013 2:17 PM

## 2013-08-01 NOTE — CV Procedure (Signed)
CARDIAC CATHETERIZATION  PROCEDURE:  Left heart catheterization with selective coronary angiography via the radial artery approach. Right heart catheterization with oxygen saturations via the right brachial vein approach.  INDICATIONS:  57 year old male with severe aortic stenosis, bicuspid aortic valve, dyspnea on exertion, chest pain. Mild aortic root enlargement sinotubular junction 4.2 cm, peak aortic valve transvalvular velocity 4.2 m/s. Mean gradient 42 mm mercury. Severity of aortic stenosis has increased significantly since last echocardiogram in September of 2013. Symptoms most notable are dyspnea on exertion and chest tightness when walking up flight of stairs for instance.  The risks, benefits, and details of the procedure were explained to the patient, including possibilities of stroke, heart attack, death, renal impairment, arterial damage, bleeding.  The patient verbalized understanding and wanted to proceed.  Informed written consent was obtained.  PROCEDURE TECHNIQUE:  Allen's test was performed pre-and post procedure and was normal. The right brachial vein site was prepped and draped in a sterile fashion after placement of a antecubital IV. 5 French sheath was placed after mild lidocaine to region. Right heart catheterization, 5 French balloon tipped catheter was performed without difficulty. Pressures and oxygen saturations were obtained. After, catheter removed. Sterile technique was maintained. The right radial artery site was prepped and draped in a sterile fashion. One percent lidocaine was used for local anesthesia. Using the modified Seldinger technique a 5 French hydrophilic sheath was inserted into the radial artery without difficulty. 3 mg of verapamil was administered via the sheath. A Judkins right #4 catheter with the guidance of a Versicore wire was placed in the right coronary cusp and selectively cannulated the right coronary artery. After traversing the aortic arch,  4500 units of heparin IV was administered. A Judkins left #4 catheter was used to selectively cannulate the left main artery after the 3.5 catheter did not adequately select the left main artery. Multiple views with hand injection of Omnipaque were obtained. I was unable to cross the aortic valve after multiple attempts with Judkins right catheter. Following the procedure, sheath was removed, patient was hemodynamically stable, hemostasis was maintained with a Terumo T band.   CONTRAST:  Total of 50 ml.    FLOUROSCOPY TIME: 7.6 min.  COMPLICATIONS:  None.    HEMODYNAMICS:  Aortic pressure was 124/80 mmHg; LV systolic pressure was not performed.  Pulmonary artery saturation 68% Radial artery saturation 94% Cardiac output 4.9 L, index 2.36. Right atrial pressure 13/12/10 Right ventricular pressure 33/7/12   Pulmonary artery pressure 36/17/24 Pulmonary capillary wedge pressure 17/18/15  ANGIOGRAPHIC DATA:    Left main: Large left main, branching into LAD and circumflex artery. No angiographically significant disease.  Left anterior descending (LAD): At the juncture of the second septal branch off of the LAD, there is a hazy plaque, ulcerated with stenosis of approximately 70%.  Circumflex artery (CIRC): Large circumflex system with 2 significant obtuse marginal branches, large in caliber with no angiographically significant disease present.  Right coronary artery (RCA): Dominant vessel giving rise to the posterior descending artery. There's a small mild luminal irregularity in the distal segment at the juncture of the posterior descending artery with stenosis of approximately 10%. Otherwise no angiographically significant disease present. There was no catheter dampening, no significant ostial stenosis noted on RAO cranial view.  LEFT VENTRICULOGRAM: Not performed. Aortic valve appears highly calcified.  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 Otherwise no angiographically significant/flow limiting coronary artery disease present. Right heart catheterization reveals upper  normal/minimally elevated pulmonary pressures. Normal cardiac output.  RECOMMENDATION:  I will forward results to Dr. Cornelius Moras. We will need to contemplate single-vessel bypass with LIMA to LAD. Bicuspid aortic valve, echocardiogram with severe aortic stenosis. Symptomatic. Proceed with surgery. Discussed with patient.

## 2013-08-04 ENCOUNTER — Ambulatory Visit: Payer: BC Managed Care – PPO | Admitting: Thoracic Surgery (Cardiothoracic Vascular Surgery)

## 2013-08-04 LAB — POCT I-STAT 3, VENOUS BLOOD GAS (G3P V)
Acid-base deficit: 2 mmol/L (ref 0.0–2.0)
Bicarbonate: 24 mEq/L (ref 20.0–24.0)
O2 Saturation: 68 %
TCO2: 25 mmol/L (ref 0–100)
pCO2, Ven: 43.6 mmHg — ABNORMAL LOW (ref 45.0–50.0)
pH, Ven: 7.348 — ABNORMAL HIGH (ref 7.250–7.300)
pO2, Ven: 37 mmHg (ref 30.0–45.0)

## 2013-08-04 LAB — POCT I-STAT 3, ART BLOOD GAS (G3+)
Acid-base deficit: 3 mmol/L — ABNORMAL HIGH (ref 0.0–2.0)
Bicarbonate: 22.2 mEq/L (ref 20.0–24.0)
O2 Saturation: 94 %
TCO2: 23 mmol/L (ref 0–100)
pCO2 arterial: 38.8 mmHg (ref 35.0–45.0)

## 2013-08-13 ENCOUNTER — Ambulatory Visit: Payer: BC Managed Care – PPO | Admitting: Surgery

## 2013-08-15 NOTE — H&P (Signed)
[address]       [place] [name] [address]             [phone]          CARDIOTHORACIC SURGERY HISTORY AND PHYSICAL EXAM  Referring Provider is Donato Schultz, MD PCP is FRIED, Doris Cheadle, MD    Chief Complaint   Patient presents with   .  Aortic Stenosis       Surgical eval on severe AS, ECHO 07/02/13, Cardiac cath scheduled for 08/01/13     HPI:  Patient is a 57 year old white male from [place] with known history of bicuspid aortic valve and aortic stenosis who has been referred for elective surgical consultation. The patient states that he was first noted to have a heart murmur on physical exam approximately 15 years ago when he presented with symptomatic cholelithiasis. He was diagnosed with a bicuspid aortic valve at that time and he subsequently underwent elective cholecystectomy without complication. He has been followed for the last several years by Dr. Anne Fu with serial echocardiograms. Over the last few years the patient has began to experience exertional shortness of breath and chest discomfort. A followup echocardiogram was performed 07/02/2013 that reportedly demonstrates the presence of severe aortic stenosis with peak velocity across the aortic valve measured 4.18 m/s corresponding to estimated mean transvalvular gradient of 42 millimeters mercury. Left ventricular systolic function remains normal with ejection fraction estimated 60-65%. There was mild aortic root enlargement with the sinotubular junction measured 4.2 cm. There was mild aortic regurgitation and severe calcification of the aortic valve. The severity of aortic stenosis had notably progressed significantly since the last previous echocardiogram performed in September 2013.  The patient was referred for elective surgical consultation and originally seen in on 07/16/2013.  Since then he underwent CT angiogram of the chest on 07/23/2013, and he underwent left and right heart catheterization  earlier today by Dr. Anne Fu. He returns to the office today to discuss results of these tests. He reports no new problems or complaints.  The patient describes a gradual onset of symptoms of exertional shortness of breath and chest tightness. He states that the symptoms occur only with relatively strenuous exertion or walking up a flight of stairs. He denies any episodes of shortness of breath or chest discomfort occurring at rest or with normal activity. He denies any nocturnal shortness of breath, orthopnea, PND, tachypalpitations, or lower extremity edema. He has had occasional mild dizzy spells without syncope.   Past Medical History  Diagnosis Date  . Calculus of gallbladder   . Cholecystitis   . Osteoarthritis   . Hypertension   . Aortic stenosis   . Hypertriglyceridemia   . Bicuspid aortic valve   . Amaurosis fugax   . Chest pain on exertion   . Exertional shortness of breath   . Amaurosis fugax 2012  . Incidental pulmonary nodule, > 3mm and < 8mm 07/23/2013    5 mm L lung nodule and 4 mm R lung nodule seen incidentally on chest CT  . Coronary artery disease 08/01/2013    LAD stenosis    Past Surgical History  Procedure Laterality Date  . Umbillical hernia    . Cholecystectomy    . Nose surgery      Family History  Problem Relation Age of Onset  . CAD Father     MI  . Alzheimer's disease Mother   . Arthritis-Osteo Brother   . Allergic rhinitis Sister   .  Colon polyps Daughter     Social History History  Substance Use Topics  . Smoking status: Former Smoker    Quit date: 10/09/1992  . Smokeless tobacco: Former Neurosurgeon     Comment: chewed on cigars  . Alcohol Use: No    Prior to Admission medications   Medication Sig Start Date End Date Taking? Authorizing Provider  aspirin EC 81 MG tablet Take 81 mg by mouth daily.   Yes Historical Provider, MD  fluticasone (FLONASE) 50 MCG/ACT nasal spray Place 2 sprays into the nose daily as needed for allergies.    Yes  Historical Provider, MD  hydrochlorothiazide (HYDRODIURIL) 25 MG tablet Take 25 mg by mouth daily.   Yes Historical Provider, MD  potassium chloride SA (KLOR-CON M20) 20 MEQ tablet Take 1 tablet (20 mEq total) by mouth daily. 07/29/13  Yes Donato Schultz, MD    No Known Allergies  Review of Systems:              General:                      normal appetite, decreased energy, no weight gain, no weight loss, no fever             Cardiac:                      + chest pain with exertion, no chest pain at rest, + SOB with exertion, no resting SOB, no PND, no orthopnea, occasional palpitations, no arrhythmia, no atrial fibrillation, no LE edema, + dizzy spells, no syncope             Respiratory:                exertional shortness of breath, no home oxygen, no productive cough, no dry cough, no bronchitis, no wheezing, no hemoptysis, no asthma, no pain with inspiration or cough, no sleep apnea, no CPAP at night             GI:                                no difficulty swallowing, no reflux, no frequent heartburn, no hiatal hernia, no abdominal pain, no constipation, no diarrhea, no hematochezia, no hematemesis, no melena, + intermittent pain and blood in stool from anal fissure             GU:                              no dysuria,  no frequency, no urinary tract infection, no hematuria, no enlarged prostate, no kidney stones, no kidney disease             Vascular:                     no pain suggestive of claudication, no pain in feet, no leg cramps, no varicose veins, no DVT, no non-healing foot ulcer             Neuro:                         no stroke, + TIA's, no seizures, no headaches, + temporary blindness one eye,  no slurred speech, no peripheral neuropathy, mild chronic pain in back, no instability of gait, no memory/cognitive  dysfunction             Musculoskeletal:         + arthritis, no joint swelling, + myalgias, no difficulty walking, normal mobility               Skin:                             no rash, no itching, no skin infections, no pressure sores or ulcerations             Psych:                         no anxiety, no depression, no nervousness, no unusual recent stress             Eyes:                           no blurry vision, no floaters, no recent vision changes, + wears glasses or contacts             ENT:                            no hearing loss, no loose or painful teeth, no dentures, last saw dentist within the past 6 months             Hematologic:               no easy bruising, no abnormal bleeding, no clotting disorder, no frequent epistaxis             Endocrine:                   no diabetes, does not check CBG's at home                           Physical Exam:              BP 132/82  Pulse 90  Resp 20  Ht 5\' 10"  (1.778 m)  Wt 200 lb (90.719 kg)  BMI 28.7 kg/m2  SpO2 98%             General:                        well-appearing             HEENT:                       Unremarkable               Neck:                           no JVD, no bruits, no adenopathy               Chest:                         clear to auscultation, symmetrical breath sounds, no wheezes, no rhonchi               CV:  RRR, grade IV/VI crescendo/decrescendo systolic murmur               Abdomen:                    soft, non-tender, no masses               Extremities:                 warm, well-perfused, pulses palpable, no LE edema             Rectal/GU                   Deferred             Neuro:                         Grossly non-focal and symmetrical throughout             Skin:                            Clean and dry, no rashes, no breakdown   Diagnostic Tests:  TRANSTHORACIC ECHOCARDIOGRAM  Report from transthoracic echocardiogram performed at [facility] cardiology on 07/02/2013 is reviewed. Images from this examination are not currently available for review. By report there was severe aortic stenosis with peak velocity across the  aortic valve measured 4.18 m/s. The mean transvalvular gradient was estimated 42 millimeters mercury.  There was mild aortic valve regurgitation with severe calcification of the aortic valve. There was mild aortic root dilatation with the sinotubular junction measured 4.2 cm. There was mild asymmetric septal hypertrophy. Left ventricular systolic function was normal with ejection fraction estimated 60-65%.  CT ANGIOGRAPHY CHEST, ABDOMEN AND PELVIS   TECHNIQUE:   Multidetector CT imaging through the chest, abdomen and pelvis was   performed using the standard protocol during bolus administration of   intravenous contrast. Multiplanar reconstructed images including   MIPs were obtained and reviewed to evaluate the vascular anatomy.   CONTRAST: 75mL OMNIPAQUE IOHEXOL 350 MG/ML SOLN   COMPARISON: None.   FINDINGS:   CTA CHEST FINDINGS   Ascending thoracic aorta measures up to 4 cm. Calcifications at the   aortic valve. No evidence for aortic dissection. The great vessels   are patent. Descending thoracic aorta measures 2.5 cm. There are   small mediastinal lymph nodes. Overall, there is no significant   chest lymphadenopathy. Heart size is normal.   The trachea and mainstem bronchi are patent. Peripheral 5 mm nodule   in the left lower lobe on sequence 5, image 47. There is a 4 mm   pleural-based nodule along the right major fissure on image 33. No   significant consolidation or airspace disease. No acute bone   abnormality.   Review of the MIP images confirms the above findings.   CTA ABDOMEN AND PELVIS FINDINGS   No evidence for free air. Low-attenuation of the liver suggests   hepatic steatosis. The gallbladder has been removed. No gross   abnormality to the pancreas, spleen, adrenal glands or kidneys. No   free fluid or lymphadenopathy. No gross abnormality to the prostate   or urinary bladder. Few colonic diverticula without acute colonic   inflammation. Normal appearance of the small  and large bowel. No   acute bone abnormality. Degenerative disc disease at L5-S1.   Arterial structures: The abdominal aorta is patent without aneurysm  or dissection. The celiac trunk, SMA and IMA are widely patent.   Bilateral renal arteries are patent. Incidentally, there is an   accessory left renal artery which originates near the main left   renal artery. The common iliac arteries are torturous without   dilatation or dissection. Right common iliac artery measures 1 cm in   diameter. Left common iliac artery measures 1.3 cm in diameter. The   internal and external iliac arteries are widely patent bilaterally.   The proximal femoral arteries are widely patent bilaterally. No   significant atherosclerotic disease.   Review of the MIP images confirms the above findings.   IMPRESSION:   CTA chest: No acute chest abnormality.   Mild dilatation of the ascending thoracic aorta measuring 4 cm. No   evidence for aortic dissection.   Calcifications involving the aortic valve.   There are 2 pulmonary nodules. The largest measures 5 mm. If the   patient is at high risk for bronchogenic carcinoma, follow-up chest   CT at 6-12 months is recommended. If the patient is at low risk for   bronchogenic carcinoma, follow-up chest CT at 12 months is   recommended. This recommendation follows the consensus statement:   Guidelines for Management of Small Pulmonary Nodules Detected on CT   Scans: A Statement from the Fleischner Society as published in   Radiology 2005;237:395-400.   CTA abdomen and pelvis: Normal appearance of the arterial structures   without atherosclerotic disease or aneurysm.   No acute abnormality in the abdomen or pelvis.   Hepatic steatosis.   Electronically Signed   By: Richarda Overlie M.D.   On: 07/23/2013 15:51    CARDIAC CATHETERIZATION   PROCEDURE: Left heart catheterization with selective coronary angiography via the radial artery approach. Right heart catheterization with  oxygen saturations via the right brachial vein approach.   INDICATIONS: 57 year old male with severe aortic stenosis, bicuspid aortic valve, dyspnea on exertion, chest pain. Mild aortic root enlargement sinotubular junction 4.2 cm, peak aortic valve transvalvular velocity 4.2 m/s. Mean gradient 42 mm mercury. Severity of aortic stenosis has increased significantly since last echocardiogram in September of 2013. Symptoms most notable are dyspnea on exertion and chest tightness when walking up flight of stairs for instance.   The risks, benefits, and details of the procedure were explained to the patient, including possibilities of stroke, heart attack, death, renal impairment, arterial damage, bleeding. The patient verbalized understanding and wanted to proceed. Informed written consent was obtained.   PROCEDURE TECHNIQUE: Allen's test was performed pre-and post procedure and was normal. The right brachial vein site was prepped and draped in a sterile fashion after placement of a antecubital IV. 5 French sheath was placed after mild lidocaine to region. Right heart catheterization, 5 French balloon tipped catheter was performed without difficulty. Pressures and oxygen saturations were obtained. After, catheter removed. Sterile technique was maintained.   The right radial artery site was prepped and draped in a sterile fashion. One percent lidocaine was used for local anesthesia. Using the modified Seldinger technique a 5 French hydrophilic sheath was inserted into the radial artery without difficulty. 3 mg of verapamil was administered via the sheath. A Judkins right #4 catheter with the guidance of a Versicore wire was placed in the right coronary cusp and selectively cannulated the right coronary artery. After traversing the aortic arch, 4500 units of heparin IV was administered. A Judkins left #4 catheter was used to selectively cannulate the left main artery after the 3.5 catheter  did not adequately select  the left main artery. Multiple views with hand injection of Omnipaque were obtained. I was unable to cross the aortic valve after multiple attempts with Judkins right catheter. Following the procedure, sheath was removed, patient was hemodynamically stable, hemostasis was maintained with a Terumo T band.   CONTRAST: Total of 50 ml.   FLOUROSCOPY TIME: 7.6 min.   COMPLICATIONS: None.   HEMODYNAMICS: Aortic pressure was 124/80 mmHg; LV systolic pressure was not performed.   Pulmonary artery saturation 68%   Radial artery saturation 94%   Cardiac output 4.9 L, index 2.36.   Right atrial pressure 13/12/10   Right ventricular pressure 33/7/12   Pulmonary artery pressure 36/17/24   Pulmonary capillary wedge pressure 17/18/15   ANGIOGRAPHIC DATA:   Left main: Large left main, branching into LAD and circumflex artery. No angiographically significant disease.   Left anterior descending (LAD): At the juncture of the second septal branch off of the LAD, there is a hazy plaque, ulcerated with stenosis of approximately 70%.   Circumflex artery (CIRC): Large circumflex system with 2 significant obtuse marginal branches, large in caliber with no angiographically significant disease present.   Right coronary artery (RCA): Dominant vessel giving rise to the posterior descending artery. There's a small mild luminal irregularity in the distal segment at the juncture of the posterior descending artery with stenosis of approximately 10%. Otherwise no angiographically significant disease present. There was no catheter dampening, no significant ostial stenosis noted on RAO cranial view.   LEFT VENTRICULOGRAM: Not performed. Aortic valve appears highly calcified.  IMPRESSIONS:   1. 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 Otherwise no angiographically significant/flow limiting coronary artery disease present. 2. Right heart catheterization reveals upper normal/minimally elevated  pulmonary pressures. 3. Normal cardiac output. RECOMMENDATION: I will forward results to Dr. Cornelius Moras. We will need to contemplate single-vessel bypass with LIMA to LAD. Bicuspid aortic valve, echocardiogram with severe aortic stenosis. Symptomatic. Proceed with surgery. Discussed with patient.        Impression:  Patient has bicuspid aortic valve with severe symptomatic aortic stenosis and preserved left ventricular systolic function. 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 There is otherwise no significant coronary artery disease. Pulmonary artery pressures are very slightly elevated. 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 Based upon these findings I feel the patient would best be treated with conventional surgical aortic valve replacement with coronary artery bypass grafting including left internal mammary artery to the distal left anterior descending coronary artery. Although it is conceivable that the patient could undergo surgery using minimally invasive techniques with hybrid PCI and stenting of the left anterior descending coronary artery, I would favor the more conservative approach with left internal mammary artery grafting the a full conventional sternotomy.  The patient was also incidentally noted to have 2 very small, benign-appearing pulmonary nodules.      Plan:  I've discussed the results of all these tests with the patient and his family in the office today. We plan to proceed with aortic valve replacement and single-vessel coronary artery bypass grafting on Wednesday, 08/20/2013. The patient specifically requests that his valve be replaced using a bioprosthetic tissue valve.  The patient understands and accepts all potential associated risks of surgery including but not limited to risk of death,  stroke, myocardial infarction, congestive heart failure, respiratory failure,  renal failure, pneumonia, bleeding requiring blood transfusion and or reexploration, arrhythmia, heart block or bradycardia requiring permanent pacemaker, aortic dissection or other major vascular complication, pleural effusions or other delayed complications related to continued congestive heart failure, and other late complications related to valve replacement including structural valve deterioration and failure, thrombosis, endocarditis, or paravalvular leak.  I've instructed the patient to stop taking fish oil capsules between now and the time of surgery. He will otherwise continue all of his current medications without change. The patient has also been of advised to avoid any sort of heavy lifting or strenuous physical exertion between now and the time of surgery. We also discussed the 2 small pulmonary nodules discovered incidentally on chest CT scan. We will plan followup CT scan in one year because the patient is relatively low risk for the development of lung cancer.  All of his questions been addressed.     Salvatore Decent. Cornelius Moras, MD 08/01/2013 2:17 PM

## 2013-08-18 ENCOUNTER — Encounter (HOSPITAL_COMMUNITY): Payer: Self-pay

## 2013-08-18 ENCOUNTER — Encounter (HOSPITAL_COMMUNITY)
Admission: RE | Admit: 2013-08-18 | Discharge: 2013-08-18 | Disposition: A | Discharge status: Home / Self Care | Payer: BC Managed Care – PPO | Source: Ambulatory Visit | Attending: Thoracic Surgery (Cardiothoracic Vascular Surgery) | Admitting: Thoracic Surgery (Cardiothoracic Vascular Surgery)

## 2013-08-18 ENCOUNTER — Ambulatory Visit (HOSPITAL_COMMUNITY)
Admission: RE | Admit: 2013-08-18 | Discharge: 2013-08-18 | Disposition: A | Discharge status: Home / Self Care | Payer: BC Managed Care – PPO | Source: Ambulatory Visit | Attending: Thoracic Surgery (Cardiothoracic Vascular Surgery) | Admitting: Thoracic Surgery (Cardiothoracic Vascular Surgery)

## 2013-08-18 VITALS — BP 132/84 | HR 62 | Temp 97.9°F | Resp 18 | Ht 70.0 in | Wt 202.4 lb

## 2013-08-18 DIAGNOSIS — I2510 Atherosclerotic heart disease of native coronary artery without angina pectoris: Secondary | ICD-10-CM

## 2013-08-18 DIAGNOSIS — I359 Nonrheumatic aortic valve disorder, unspecified: Secondary | ICD-10-CM | POA: Insufficient documentation

## 2013-08-18 DIAGNOSIS — R0602 Shortness of breath: Secondary | ICD-10-CM | POA: Insufficient documentation

## 2013-08-18 DIAGNOSIS — Z01810 Encounter for preprocedural cardiovascular examination: Secondary | ICD-10-CM

## 2013-08-18 HISTORY — DX: Heart failure, unspecified: I50.9

## 2013-08-18 HISTORY — DX: Cardiac murmur, unspecified: R01.1

## 2013-08-18 HISTORY — DX: Cardiac arrhythmia, unspecified: I49.9

## 2013-08-18 LAB — BLOOD GAS, ARTERIAL
Acid-base deficit: 0.1 mmol/L (ref 0.0–2.0)
Drawn by: 181601
O2 Saturation: 99.2 %
TCO2: 24 mmol/L (ref 0–100)
pO2, Arterial: 121 mmHg — ABNORMAL HIGH (ref 80.0–100.0)

## 2013-08-18 LAB — COMPREHENSIVE METABOLIC PANEL
BUN: 13 mg/dL (ref 6–23)
CO2: 20 mEq/L (ref 19–32)
Calcium: 9.4 mg/dL (ref 8.4–10.5)
Chloride: 104 mEq/L (ref 96–112)
Creatinine, Ser: 0.91 mg/dL (ref 0.50–1.35)
GFR calc Af Amer: >90 mL/min (ref >90)
GFR calc non Af Amer: >90 mL/min (ref >90)
Glucose, Bld: 81 mg/dL (ref 70–99)
Total Bilirubin: 0.6 mg/dL (ref 0.3–1.2)
Total Protein: 7.5 g/dL (ref 6.0–8.3)

## 2013-08-18 LAB — CBC
HCT: 46 % (ref 39.0–52.0)
Hemoglobin: 16.4 g/dL (ref 13.0–17.0)
MCH: 30.2 pg (ref 26.0–34.0)
MCHC: 35.7 g/dL (ref 30.0–36.0)
MCV: 84.7 fL (ref 78.0–100.0)
Platelets: 215 10*3/uL (ref 150–400)
RBC: 5.43 MIL/uL (ref 4.22–5.81)

## 2013-08-18 LAB — URINALYSIS, ROUTINE W REFLEX MICROSCOPIC
Bilirubin Urine: NEGATIVE
Hgb urine dipstick: NEGATIVE
Leukocytes, UA: NEGATIVE
Specific Gravity, Urine: 1.022 (ref 1.005–1.030)
Urobilinogen, UA: 0.2 mg/dL (ref 0.0–1.0)
pH: 5 (ref 5.0–8.0)

## 2013-08-18 LAB — APTT: aPTT: 28 seconds (ref 24–37)

## 2013-08-18 LAB — PULMONARY FUNCTION TEST

## 2013-08-18 LAB — ABO/RH: ABO/RH(D): A NEG

## 2013-08-18 LAB — PROTIME-INR: Prothrombin Time: 12.3 seconds (ref 11.6–15.2)

## 2013-08-18 LAB — SURGICAL PCR SCREEN
MRSA, PCR: NEGATIVE
Staphylococcus aureus: NEGATIVE

## 2013-08-18 LAB — HEMOGLOBIN A1C
Hgb A1c MFr Bld: 5.4 % (ref <5.7)
Mean Plasma Glucose: 108 mg/dL (ref <117)

## 2013-08-18 LAB — TYPE AND SCREEN: ABO/RH(D): A NEG

## 2013-08-18 NOTE — Progress Notes (Addendum)
Pre-op Cardiac Surgery  Carotid Findings:  Findings suggest 1-39% internal carotid artery stenosis bilaterally. Vertebral arteries are patent with antegrade flow.  Upper Extremity Right Left  Brachial Pressures 125-Triphasic 120-Triphasic  Radial Waveforms Triphasic Triphasic  Ulnar Waveforms Unable to insonate. Triphasic  Palmar Arch (Allen's Test) Signal obliterates with radial compression, is unaffected with ulnar compression. Signal is unaffected with radial compression, obliterates with ulnar compression.    Findings:   Bilateral palpable pedal pulses.   08/18/2013 10:50 AM Gertie Fey, RVT, RDCS, RDMS

## 2013-08-18 NOTE — Pre-Procedure Instructions (Addendum)
Jeffery [name]  08/18/2013   Your procedure is scheduled on:  Nov 12th Wed.  Report to [facility]  2 * 3 at [phone].  Call this number if you have problems the morning of surgery: [phone]   Remember:   Do not eat food or drink liquids after midnight.   Take these medicines the morning of surgery with A SIP OF WATER: NA    Stop taking Aspirin Aleve, Ibuprofen, BC's, Goody's, Herbal Medications, and Fish oil   Do not wear jewelry, make-up or nail polish.  Do not wear lotions, powders, or perfumes. You may wear deodorant.  Do not shave 48 hours prior to surgery. Men may shave face and neck.  Do not bring valuables to the hospital.  [facility] is not responsible                  for any belongings or valuables.               Contacts, dentures or bridgework may not be worn into surgery.  Leave suitcase in the car. After surgery it may be brought to your room.  For patients admitted to the hospital, discharge time is determined by your                treatment team.               Patients discharged the day of surgery will not be allowed to drive  home.    Special Instructions: Shower using CHG 2 nights before surgery and the night before surgery.  If you shower the day of surgery use CHG.  Use special wash - you have one bottle of CHG for all showers.  You should use approximately 1/3 of the bottle for each shower.   Please read over the following fact sheets that you were given: Pain Booklet, Coughing and Deep Breathing, Blood Transfusion Information, Open Heart Packet, MRSA Information and Surgical Site Infection Prevention

## 2013-08-18 NOTE — Progress Notes (Signed)
PCP is Dr. Foy Guadalajara. Cardiologist is Dr. Anne Fu Echo and Card cath noted in epic Denies having a recent Stress test. Voices understanding of pre-admit instructions.

## 2013-08-18 NOTE — Progress Notes (Signed)
08/18/13 1206  OBSTRUCTIVE SLEEP APNEA  Score 4 or greater  Results sent to PCP

## 2013-08-19 MED ORDER — PLASMA-LYTE 148 IV SOLN
INTRAVENOUS | Status: AC
  Administered 2013-08-20: 11:00:00
  Filled 2013-08-19: qty 2.5

## 2013-08-19 MED ORDER — DEXTROSE 5 % IV SOLN
1.5000 g | INTRAVENOUS | Status: AC
  Administered 2013-08-20: 1.5 g via INTRAVENOUS
  Administered 2013-08-20: .75 g via INTRAVENOUS
  Filled 2013-08-19 (×2): qty 1.5

## 2013-08-19 MED ORDER — EPINEPHRINE HCL 1 MG/ML IJ SOLN
0.5000 ug/min | INTRAMUSCULAR | Status: DC
  Filled 2013-08-19: qty 4

## 2013-08-19 MED ORDER — POTASSIUM CHLORIDE 2 MEQ/ML IV SOLN
80.0000 meq | INTRAVENOUS | Status: DC
  Filled 2013-08-19: qty 40

## 2013-08-19 MED ORDER — SODIUM CHLORIDE 0.9 % IV SOLN
INTRAVENOUS | Status: AC
  Administered 2013-08-20: 69.8 mL/h via INTRAVENOUS
  Filled 2013-08-19: qty 40

## 2013-08-19 MED ORDER — CEFUROXIME SODIUM 750 MG IJ SOLR
750.0000 mg | INTRAMUSCULAR | Status: DC
  Filled 2013-08-19: qty 750

## 2013-08-19 MED ORDER — SODIUM CHLORIDE 0.9 % IV SOLN
INTRAVENOUS | Status: AC
  Administered 2013-08-20: 1.3 [IU]/h via INTRAVENOUS
  Filled 2013-08-19: qty 1

## 2013-08-19 MED ORDER — DEXMEDETOMIDINE HCL IN NACL 400 MCG/100ML IV SOLN
0.1000 ug/kg/h | INTRAVENOUS | Status: AC
  Administered 2013-08-20: 0.3 ug/kg/h via INTRAVENOUS
  Filled 2013-08-19: qty 100

## 2013-08-19 MED ORDER — SODIUM CHLORIDE 0.9 % IV SOLN
INTRAVENOUS | Status: DC
  Filled 2013-08-19: qty 1000

## 2013-08-19 MED ORDER — HEPARIN SODIUM (PORCINE) 1000 UNIT/ML IJ SOLN
INTRAMUSCULAR | Status: DC
  Filled 2013-08-19: qty 30

## 2013-08-19 MED ORDER — DOPAMINE-DEXTROSE 3.2-5 MG/ML-% IV SOLN
2.0000 ug/kg/min | INTRAVENOUS | Status: DC
  Filled 2013-08-19: qty 250

## 2013-08-19 MED ORDER — VANCOMYCIN HCL 10 G IV SOLR
1500.0000 mg | INTRAVENOUS | Status: AC
  Administered 2013-08-20: 1500 mg via INTRAVENOUS
  Filled 2013-08-19: qty 1500

## 2013-08-19 MED ORDER — NITROGLYCERIN IN D5W 200-5 MCG/ML-% IV SOLN
2.0000 ug/min | INTRAVENOUS | Status: AC
  Administered 2013-08-20: 5 ug/min via INTRAVENOUS
  Filled 2013-08-19: qty 250

## 2013-08-19 MED ORDER — PHENYLEPHRINE HCL 10 MG/ML IJ SOLN
30.0000 ug/min | INTRAVENOUS | Status: DC
  Filled 2013-08-19: qty 2

## 2013-08-19 MED ORDER — MAGNESIUM SULFATE 50 % IJ SOLN
40.0000 meq | INTRAMUSCULAR | Status: DC
  Filled 2013-08-19: qty 10

## 2013-08-20 ENCOUNTER — Encounter (HOSPITAL_COMMUNITY): Payer: Self-pay | Admitting: Critical Care Medicine

## 2013-08-20 ENCOUNTER — Inpatient Hospital Stay (HOSPITAL_COMMUNITY): Payer: BC Managed Care – PPO | Admitting: Critical Care Medicine

## 2013-08-20 ENCOUNTER — Encounter (HOSPITAL_COMMUNITY)
Admission: RE | Disposition: A | Discharge status: Home / Self Care | Payer: BC Managed Care – PPO | Source: Ambulatory Visit | Attending: Thoracic Surgery (Cardiothoracic Vascular Surgery)

## 2013-08-20 ENCOUNTER — Encounter (HOSPITAL_COMMUNITY): Payer: BC Managed Care – PPO | Admitting: Critical Care Medicine

## 2013-08-20 ENCOUNTER — Inpatient Hospital Stay (HOSPITAL_COMMUNITY)
Admission: RE | Admit: 2013-08-20 | Discharge: 2013-08-24 | DRG: 220 | Disposition: A | Discharge status: Home / Self Care | Payer: BC Managed Care – PPO | Source: Ambulatory Visit | Attending: Thoracic Surgery (Cardiothoracic Vascular Surgery) | Admitting: Thoracic Surgery (Cardiothoracic Vascular Surgery)

## 2013-08-20 ENCOUNTER — Inpatient Hospital Stay (HOSPITAL_COMMUNITY): Payer: BC Managed Care – PPO

## 2013-08-20 DIAGNOSIS — I350 Nonrheumatic aortic (valve) stenosis: Secondary | ICD-10-CM | POA: Diagnosis present

## 2013-08-20 DIAGNOSIS — I10 Essential (primary) hypertension: Secondary | ICD-10-CM | POA: Diagnosis present

## 2013-08-20 DIAGNOSIS — Z87891 Personal history of nicotine dependence: Secondary | ICD-10-CM

## 2013-08-20 DIAGNOSIS — M1990 Unspecified osteoarthritis, unspecified site: Secondary | ICD-10-CM | POA: Diagnosis present

## 2013-08-20 DIAGNOSIS — Z951 Presence of aortocoronary bypass graft: Secondary | ICD-10-CM

## 2013-08-20 DIAGNOSIS — I359 Nonrheumatic aortic valve disorder, unspecified: Principal | ICD-10-CM

## 2013-08-20 DIAGNOSIS — I2510 Atherosclerotic heart disease of native coronary artery without angina pectoris: Secondary | ICD-10-CM

## 2013-08-20 DIAGNOSIS — Z7982 Long term (current) use of aspirin: Secondary | ICD-10-CM

## 2013-08-20 DIAGNOSIS — Z953 Presence of xenogenic heart valve: Secondary | ICD-10-CM

## 2013-08-20 DIAGNOSIS — J9819 Other pulmonary collapse: Secondary | ICD-10-CM | POA: Diagnosis not present

## 2013-08-20 DIAGNOSIS — D62 Acute posthemorrhagic anemia: Secondary | ICD-10-CM | POA: Diagnosis not present

## 2013-08-20 DIAGNOSIS — Z8249 Family history of ischemic heart disease and other diseases of the circulatory system: Secondary | ICD-10-CM

## 2013-08-20 DIAGNOSIS — Q231 Congenital insufficiency of aortic valve: Secondary | ICD-10-CM

## 2013-08-20 HISTORY — DX: Presence of aortocoronary bypass graft: Z95.1

## 2013-08-20 HISTORY — DX: Presence of xenogenic heart valve: Z95.3

## 2013-08-20 HISTORY — PX: AORTIC VALVE REPLACEMENT: SHX41

## 2013-08-20 HISTORY — PX: CORONARY ARTERY BYPASS GRAFT: SHX141

## 2013-08-20 HISTORY — PX: INTRAOPERATIVE TRANSESOPHAGEAL ECHOCARDIOGRAM: SHX5062

## 2013-08-20 LAB — POCT I-STAT 4, (NA,K, GLUC, HGB,HCT)
Glucose, Bld: 110 mg/dL — ABNORMAL HIGH (ref 70–99)
Glucose, Bld: 133 mg/dL — ABNORMAL HIGH (ref 70–99)
HCT: 43 % (ref 39.0–52.0)
HCT: 40 % (ref 39.0–52.0)
HCT: 32 % — ABNORMAL LOW (ref 39.0–52.0)
HCT: 34 % — ABNORMAL LOW (ref 39.0–52.0)
HCT: 33 % — ABNORMAL LOW (ref 39.0–52.0)
HCT: 36 % — ABNORMAL LOW (ref 39.0–52.0)
Hemoglobin: 14.6 g/dL (ref 13.0–17.0)
Hemoglobin: 13.6 g/dL (ref 13.0–17.0)
Hemoglobin: 11.2 g/dL — ABNORMAL LOW (ref 13.0–17.0)
Hemoglobin: 12.2 g/dL — ABNORMAL LOW (ref 13.0–17.0)
Potassium: 3.7 mEq/L (ref 3.5–5.1)
Potassium: 3.8 mEq/L (ref 3.5–5.1)
Potassium: 5 mEq/L (ref 3.5–5.1)
Potassium: 4.4 mEq/L (ref 3.5–5.1)
Sodium: 141 mEq/L (ref 135–145)
Sodium: 136 mEq/L (ref 135–145)
Sodium: 138 mEq/L (ref 135–145)
Sodium: 139 mEq/L (ref 135–145)
Sodium: 142 mEq/L (ref 135–145)

## 2013-08-20 LAB — CBC
HCT: 36.1 % — ABNORMAL LOW (ref 39.0–52.0)
MCH: 30.6 pg (ref 26.0–34.0)
MCHC: 36 g/dL (ref 30.0–36.0)
MCV: 85.3 fL (ref 78.0–100.0)
Platelets: 130 10*3/uL — ABNORMAL LOW (ref 150–400)
Platelets: 148 10*3/uL — ABNORMAL LOW (ref 150–400)
RDW: 12.3 % (ref 11.5–15.5)
RDW: 12.4 % (ref 11.5–15.5)
WBC: 11.7 10*3/uL — ABNORMAL HIGH (ref 4.0–10.5)

## 2013-08-20 LAB — POCT I-STAT 3, ART BLOOD GAS (G3+)
Acid-base deficit: 1 mmol/L (ref 0.0–2.0)
Acid-base deficit: 3 mmol/L — ABNORMAL HIGH (ref 0.0–2.0)
Acid-base deficit: 2 mmol/L (ref 0.0–2.0)
Acid-base deficit: 3 mmol/L — ABNORMAL HIGH (ref 0.0–2.0)
Bicarbonate: 25.2 mEq/L — ABNORMAL HIGH (ref 20.0–24.0)
Bicarbonate: 23.5 mEq/L (ref 20.0–24.0)
Bicarbonate: 21.6 mEq/L (ref 20.0–24.0)
Bicarbonate: 22.7 mEq/L (ref 20.0–24.0)
O2 Saturation: 100 %
O2 Saturation: 100 %
O2 Saturation: 98 %
O2 Saturation: 97 %
Patient temperature: 37.3
Patient temperature: 37.4
TCO2: 27 mmol/L (ref 0–100)
TCO2: 25 mmol/L (ref 0–100)
TCO2: 24 mmol/L (ref 0–100)
TCO2: 23 mmol/L (ref 0–100)
pCO2 arterial: 36.2 mmHg (ref 35.0–45.0)
pCO2 arterial: 41.4 mmHg (ref 35.0–45.0)
pH, Arterial: 7.352 (ref 7.350–7.450)
pH, Arterial: 7.385 (ref 7.350–7.450)
pH, Arterial: 7.349 — ABNORMAL LOW (ref 7.350–7.450)
pO2, Arterial: 330 mmHg — ABNORMAL HIGH (ref 80.0–100.0)
pO2, Arterial: 120 mmHg — ABNORMAL HIGH (ref 80.0–100.0)
pO2, Arterial: 100 mmHg (ref 80.0–100.0)

## 2013-08-20 LAB — CREATININE, SERUM
GFR calc Af Amer: >90 mL/min (ref >90)
GFR calc non Af Amer: 82 mL/min — ABNORMAL LOW (ref >90)

## 2013-08-20 LAB — POCT I-STAT, CHEM 8
BUN: 16 mg/dL (ref 6–23)
Calcium, Ion: 1.19 mmol/L (ref 1.12–1.23)
Chloride: 107 mEq/L (ref 96–112)
HCT: 36 % — ABNORMAL LOW (ref 39.0–52.0)
Potassium: 4.4 mEq/L (ref 3.5–5.1)
TCO2: 22 mmol/L (ref 0–100)

## 2013-08-20 LAB — PROTIME-INR
INR: 1.47 (ref 0.00–1.49)
Prothrombin Time: 17.4 seconds — ABNORMAL HIGH (ref 11.6–15.2)

## 2013-08-20 LAB — APTT: aPTT: 32 seconds (ref 24–37)

## 2013-08-20 LAB — HEMOGLOBIN AND HEMATOCRIT, BLOOD: Hemoglobin: 12.4 g/dL — ABNORMAL LOW (ref 13.0–17.0)

## 2013-08-20 SURGERY — REPLACEMENT, AORTIC VALVE, OPEN
Anesthesia: General | Site: Chest | Wound class: Clean

## 2013-08-20 MED ORDER — INSULIN REGULAR BOLUS VIA INFUSION
0.0000 [IU] | Freq: Three times a day (TID) | INTRAVENOUS | Status: DC
  Filled 2013-08-20: qty 10

## 2013-08-20 MED ORDER — LACTATED RINGERS IV SOLN
INTRAVENOUS | Status: DC | PRN
  Administered 2013-08-20: 08:00:00 via INTRAVENOUS

## 2013-08-20 MED ORDER — ACETAMINOPHEN 500 MG PO TABS
1000.0000 mg | ORAL_TABLET | Freq: Four times a day (QID) | ORAL | Status: DC
  Administered 2013-08-20 – 2013-08-24 (×12): 1000 mg via ORAL
  Filled 2013-08-20 (×20): qty 2

## 2013-08-20 MED ORDER — METOPROLOL TARTRATE 25 MG/10 ML ORAL SUSPENSION
12.5000 mg | Freq: Two times a day (BID) | ORAL | Status: DC
  Filled 2013-08-20 (×3): qty 5

## 2013-08-20 MED ORDER — ALBUMIN HUMAN 5 % IV SOLN
250.0000 mL | INTRAVENOUS | Status: DC | PRN
  Administered 2013-08-20 (×2): 250 mL via INTRAVENOUS

## 2013-08-20 MED ORDER — ARTIFICIAL TEARS OP OINT
TOPICAL_OINTMENT | OPHTHALMIC | Status: DC | PRN
  Administered 2013-08-20: 1 via OPHTHALMIC

## 2013-08-20 MED ORDER — ALBUMIN HUMAN 5 % IV SOLN
INTRAVENOUS | Status: DC | PRN
  Administered 2013-08-20 (×3): via INTRAVENOUS

## 2013-08-20 MED ORDER — CHLORHEXIDINE GLUCONATE 4 % EX LIQD: 30.0000 mL | CUTANEOUS | Status: DC

## 2013-08-20 MED ORDER — VANCOMYCIN HCL IN DEXTROSE 1-5 GM/200ML-% IV SOLN
1000.0000 mg | Freq: Once | INTRAVENOUS | Status: AC
  Administered 2013-08-20: 1000 mg via INTRAVENOUS
  Filled 2013-08-20: qty 200

## 2013-08-20 MED ORDER — MORPHINE SULFATE 2 MG/ML IJ SOLN
1.0000 mg | INTRAMUSCULAR | Status: AC | PRN
  Administered 2013-08-20 (×2): 4 mg via INTRAVENOUS
  Filled 2013-08-20: qty 2

## 2013-08-20 MED ORDER — PROTAMINE SULFATE 10 MG/ML IV SOLN
INTRAVENOUS | Status: DC | PRN
  Administered 2013-08-20: 10 mg via INTRAVENOUS
  Administered 2013-08-20 (×3): 50 mg via INTRAVENOUS
  Administered 2013-08-20: 40 mg via INTRAVENOUS
  Administered 2013-08-20 (×2): 50 mg via INTRAVENOUS

## 2013-08-20 MED ORDER — PROPOFOL 10 MG/ML IV BOLUS
INTRAVENOUS | Status: DC | PRN
  Administered 2013-08-20: 80 mg via INTRAVENOUS

## 2013-08-20 MED ORDER — MIDAZOLAM HCL 5 MG/5ML IJ SOLN
INTRAMUSCULAR | Status: DC | PRN
  Administered 2013-08-20: 3 mg via INTRAVENOUS
  Administered 2013-08-20: 4 mg via INTRAVENOUS
  Administered 2013-08-20: 1 mg via INTRAVENOUS
  Administered 2013-08-20: 5 mg via INTRAVENOUS
  Administered 2013-08-20: 2 mg via INTRAVENOUS

## 2013-08-20 MED ORDER — DEXTROSE 5 % IV SOLN
1.5000 g | Freq: Two times a day (BID) | INTRAVENOUS | Status: AC
  Administered 2013-08-20 – 2013-08-22 (×4): 1.5 g via INTRAVENOUS
  Filled 2013-08-20 (×5): qty 1.5

## 2013-08-20 MED ORDER — SODIUM CHLORIDE 0.9 % IV SOLN: 250.0000 mL | INTRAVENOUS | Status: DC

## 2013-08-20 MED ORDER — PHENYLEPHRINE HCL 10 MG/ML IJ SOLN
20.0000 mg | INTRAVENOUS | Status: DC | PRN
  Administered 2013-08-20: 15 ug/min via INTRAVENOUS

## 2013-08-20 MED ORDER — ROCURONIUM BROMIDE 100 MG/10ML IV SOLN
INTRAVENOUS | Status: DC | PRN
  Administered 2013-08-20 (×5): 50 mg via INTRAVENOUS

## 2013-08-20 MED ORDER — SODIUM CHLORIDE 0.9 % IJ SOLN
OROMUCOSAL | Status: DC | PRN
  Administered 2013-08-20 (×4): via TOPICAL

## 2013-08-20 MED ORDER — SODIUM CHLORIDE 0.9 % IV SOLN
INTRAVENOUS | Status: DC
  Filled 2013-08-20 (×2): qty 1

## 2013-08-20 MED ORDER — POTASSIUM CHLORIDE 10 MEQ/50ML IV SOLN
10.0000 meq | INTRAVENOUS | Status: AC
  Administered 2013-08-20 (×2): 10 meq via INTRAVENOUS

## 2013-08-20 MED ORDER — METOPROLOL TARTRATE 1 MG/ML IV SOLN: 2.5000 mg | INTRAVENOUS | Status: DC | PRN

## 2013-08-20 MED ORDER — BISACODYL 10 MG RE SUPP: 10.0000 mg | Freq: Every day | RECTAL | Status: DC

## 2013-08-20 MED ORDER — DEXMEDETOMIDINE HCL IN NACL 200 MCG/50ML IV SOLN: 0.1000 ug/kg/h | INTRAVENOUS | Status: DC

## 2013-08-20 MED ORDER — METOPROLOL TARTRATE 12.5 MG HALF TABLET
12.5000 mg | ORAL_TABLET | Freq: Two times a day (BID) | ORAL | Status: DC
  Administered 2013-08-22 (×2): 12.5 mg via ORAL
  Filled 2013-08-20 (×7): qty 1

## 2013-08-20 MED ORDER — METOPROLOL TARTRATE 12.5 MG HALF TABLET
ORAL_TABLET | ORAL | Status: AC
  Administered 2013-08-20: 12.5 mg via ORAL
  Filled 2013-08-20: qty 1

## 2013-08-20 MED ORDER — ACETAMINOPHEN 160 MG/5ML PO SOLN: 1000.0000 mg | Freq: Four times a day (QID) | ORAL | Status: DC

## 2013-08-20 MED ORDER — SODIUM CHLORIDE 0.45 % IV SOLN
INTRAVENOUS | Status: DC
  Administered 2013-08-20: 20 mL/h via INTRAVENOUS

## 2013-08-20 MED ORDER — DEXMEDETOMIDINE HCL IN NACL 400 MCG/100ML IV SOLN
0.4000 ug/kg/h | INTRAVENOUS | Status: DC
  Filled 2013-08-20: qty 100

## 2013-08-20 MED ORDER — FAMOTIDINE IN NACL 20-0.9 MG/50ML-% IV SOLN
20.0000 mg | Freq: Two times a day (BID) | INTRAVENOUS | Status: AC
  Administered 2013-08-20: 20 mg via INTRAVENOUS

## 2013-08-20 MED ORDER — LACTATED RINGERS IV SOLN: INTRAVENOUS | Status: DC

## 2013-08-20 MED ORDER — SODIUM CHLORIDE 0.9 % IV SOLN: INTRAVENOUS | Status: DC

## 2013-08-20 MED ORDER — ASPIRIN 81 MG PO CHEW: 324.0000 mg | CHEWABLE_TABLET | Freq: Every day | ORAL | Status: DC

## 2013-08-20 MED ORDER — BISACODYL 5 MG PO TBEC
10.0000 mg | DELAYED_RELEASE_TABLET | Freq: Every day | ORAL | Status: DC
  Administered 2013-08-21 – 2013-08-23 (×3): 10 mg via ORAL
  Filled 2013-08-20 (×3): qty 2

## 2013-08-20 MED ORDER — PHENYLEPHRINE HCL 10 MG/ML IJ SOLN
10.0000 mg | INTRAVENOUS | Status: DC | PRN
  Administered 2013-08-20: 25 ug/min via INTRAVENOUS

## 2013-08-20 MED ORDER — OXYCODONE HCL 5 MG PO TABS
5.0000 mg | ORAL_TABLET | ORAL | Status: DC | PRN
  Administered 2013-08-20 – 2013-08-24 (×13): 10 mg via ORAL
  Filled 2013-08-20 (×13): qty 2

## 2013-08-20 MED ORDER — HEPARIN SODIUM (PORCINE) 1000 UNIT/ML IJ SOLN
INTRAMUSCULAR | Status: DC | PRN
  Administered 2013-08-20: 3000 [IU] via INTRAVENOUS
  Administered 2013-08-20: 32000 [IU] via INTRAVENOUS

## 2013-08-20 MED ORDER — MIDAZOLAM HCL 2 MG/2ML IJ SOLN: 2.0000 mg | INTRAMUSCULAR | Status: DC | PRN

## 2013-08-20 MED ORDER — ONDANSETRON HCL 4 MG/2ML IJ SOLN
4.0000 mg | Freq: Four times a day (QID) | INTRAMUSCULAR | Status: DC | PRN
  Administered 2013-08-20 – 2013-08-22 (×5): 4 mg via INTRAVENOUS
  Filled 2013-08-20 (×5): qty 2

## 2013-08-20 MED ORDER — POTASSIUM CHLORIDE 10 MEQ/50ML IV SOLN
10.0000 meq | INTRAVENOUS | Status: AC
  Administered 2013-08-20 (×3): 10 meq via INTRAVENOUS

## 2013-08-20 MED ORDER — PHENYLEPHRINE HCL 10 MG/ML IJ SOLN
0.0000 ug/min | INTRAVENOUS | Status: DC
  Filled 2013-08-20: qty 2

## 2013-08-20 MED ORDER — SODIUM CHLORIDE 0.9 % IJ SOLN: 3.0000 mL | INTRAMUSCULAR | Status: DC | PRN

## 2013-08-20 MED ORDER — VANCOMYCIN HCL 1000 MG IV SOLR
INTRAVENOUS | Status: DC | PRN
  Administered 2013-08-20: 11:00:00

## 2013-08-20 MED ORDER — MAGNESIUM SULFATE 40 MG/ML IJ SOLN
4.0000 g | Freq: Once | INTRAMUSCULAR | Status: AC
  Administered 2013-08-20: 4 g via INTRAVENOUS
  Filled 2013-08-20: qty 100

## 2013-08-20 MED ORDER — MORPHINE SULFATE 2 MG/ML IJ SOLN
2.0000 mg | INTRAMUSCULAR | Status: DC | PRN
  Administered 2013-08-20 (×2): 2 mg via INTRAVENOUS
  Administered 2013-08-21 (×2): 4 mg via INTRAVENOUS
  Filled 2013-08-20 (×2): qty 1
  Filled 2013-08-20: qty 2
  Filled 2013-08-20: qty 1
  Filled 2013-08-20 (×2): qty 2

## 2013-08-20 MED ORDER — ACETAMINOPHEN 160 MG/5ML PO SOLN: 650.0000 mg | Freq: Once | ORAL | Status: AC

## 2013-08-20 MED ORDER — PANTOPRAZOLE SODIUM 40 MG PO TBEC
40.0000 mg | DELAYED_RELEASE_TABLET | Freq: Every day | ORAL | Status: DC
  Administered 2013-08-22 – 2013-08-24 (×3): 40 mg via ORAL
  Filled 2013-08-20 (×3): qty 1

## 2013-08-20 MED ORDER — SODIUM CHLORIDE 0.9 % IR SOLN
Status: DC | PRN
  Administered 2013-08-20: 6000 mL

## 2013-08-20 MED ORDER — ASPIRIN EC 325 MG PO TBEC
325.0000 mg | DELAYED_RELEASE_TABLET | Freq: Every day | ORAL | Status: DC
  Administered 2013-08-21: 325 mg via ORAL
  Filled 2013-08-20 (×2): qty 1

## 2013-08-20 MED ORDER — NITROGLYCERIN IN D5W 200-5 MCG/ML-% IV SOLN: 0.0000 ug/min | INTRAVENOUS | Status: DC

## 2013-08-20 MED ORDER — SODIUM CHLORIDE 0.9 % IJ SOLN
3.0000 mL | Freq: Two times a day (BID) | INTRAMUSCULAR | Status: DC
  Administered 2013-08-21 (×2): 3 mL via INTRAVENOUS

## 2013-08-20 MED ORDER — METOPROLOL TARTRATE 12.5 MG HALF TABLET
12.5000 mg | ORAL_TABLET | Freq: Once | ORAL | Status: AC
  Administered 2013-08-20: 12.5 mg via ORAL

## 2013-08-20 MED ORDER — FENTANYL CITRATE 0.05 MG/ML IJ SOLN
INTRAMUSCULAR | Status: DC | PRN
  Administered 2013-08-20: 200 ug via INTRAVENOUS
  Administered 2013-08-20 (×2): 150 ug via INTRAVENOUS
  Administered 2013-08-20: 100 ug via INTRAVENOUS
  Administered 2013-08-20: 50 ug via INTRAVENOUS
  Administered 2013-08-20: 250 ug via INTRAVENOUS
  Administered 2013-08-20: 100 ug via INTRAVENOUS
  Administered 2013-08-20: 150 ug via INTRAVENOUS
  Administered 2013-08-20: 100 ug via INTRAVENOUS

## 2013-08-20 MED ORDER — LACTATED RINGERS IV SOLN: 500.0000 mL | Freq: Once | INTRAVENOUS | Status: AC | PRN

## 2013-08-20 MED ORDER — ACETAMINOPHEN 650 MG RE SUPP
650.0000 mg | Freq: Once | RECTAL | Status: AC
  Administered 2013-08-20: 650 mg via RECTAL

## 2013-08-20 MED ORDER — SODIUM CHLORIDE 0.9 % IV SOLN
INTRAVENOUS | Status: DC | PRN
  Administered 2013-08-20: 13:00:00 via INTRAVENOUS

## 2013-08-20 MED ORDER — DOCUSATE SODIUM 100 MG PO CAPS
200.0000 mg | ORAL_CAPSULE | Freq: Every day | ORAL | Status: DC
  Administered 2013-08-21 – 2013-08-23 (×3): 200 mg via ORAL
  Filled 2013-08-20 (×4): qty 2

## 2013-08-20 SURGICAL SUPPLY — 143 items
ADAPTER CARDIO PERF ANTE/RETRO (ADAPTER) ×3 IMPLANT
ADPR PRFSN 84XANTGRD RTRGD (ADAPTER) ×2
APL SKNCLS STERI-STRIP NONHPOA (GAUZE/BANDAGES/DRESSINGS) ×2
APPLIER CLIP 9.375 MED OPEN (MISCELLANEOUS)
APPLIER CLIP 9.375 SM OPEN (CLIP)
APR CLP MED 9.3 20 MLT OPN (MISCELLANEOUS)
APR CLP SM 9.3 20 MLT OPN (CLIP)
ATTRACTOMAT 16X20 MAGNETIC DRP (DRAPES) ×3 IMPLANT
BAG DECANTER FOR FLEXI CONT (MISCELLANEOUS) ×3 IMPLANT
BANDAGE ELASTIC 4 VELCRO ST LF (GAUZE/BANDAGES/DRESSINGS) ×2 IMPLANT
BANDAGE ELASTIC 6 VELCRO ST LF (GAUZE/BANDAGES/DRESSINGS) ×2 IMPLANT
BANDAGE GAUZE ELAST BULKY 4 IN (GAUZE/BANDAGES/DRESSINGS) ×2 IMPLANT
BASKET HEART (ORDER IN 25'S) (MISCELLANEOUS)
BASKET HEART (ORDER IN 25S) (MISCELLANEOUS) ×2 IMPLANT
BENZOIN TINCTURE PRP APPL 2/3 (GAUZE/BANDAGES/DRESSINGS) ×3 IMPLANT
BLADE STERNUM SYSTEM 6 (BLADE) ×3 IMPLANT
BLADE SURG 11 STRL SS (BLADE) ×3 IMPLANT
BLADE SURG 15 STRL LF DISP TIS (BLADE) IMPLANT
BLADE SURG 15 STRL SS (BLADE) ×3
BLADE SURG ROTATE 9660 (MISCELLANEOUS) ×1 IMPLANT
CANISTER SUCTION 2500CC (MISCELLANEOUS) ×3 IMPLANT
CANNULA EZ GLIDE AORTIC 21FR (CANNULA) ×3 IMPLANT
CANNULA GUNDRY RCSP 15FR (MISCELLANEOUS) ×3 IMPLANT
CANNULA SOFTFLOW AORTIC 7M21FR (CANNULA) ×3 IMPLANT
CANNULA VENOUS LOW PROF 34X46 (CANNULA) ×3 IMPLANT
CATH CPB KIT OWEN (MISCELLANEOUS) ×3 IMPLANT
CATH HEART VENT LEFT (CATHETERS) ×2 IMPLANT
CATH THORACIC 28FR (CATHETERS) IMPLANT
CATH THORACIC 28FR RT ANG (CATHETERS) IMPLANT
CATH THORACIC 36FR (CATHETERS) ×3 IMPLANT
CATH THORACIC 36FR RT ANG (CATHETERS) ×3 IMPLANT
CLIP APPLIE 9.375 MED OPEN (MISCELLANEOUS) IMPLANT
CLIP APPLIE 9.375 SM OPEN (CLIP) IMPLANT
CLIP FOGARTY SPRING 6M (CLIP) IMPLANT
CLIP RETRACTION 3.0MM CORONARY (MISCELLANEOUS) ×1 IMPLANT
CLIP TI MEDIUM 24 (CLIP) IMPLANT
CLIP TI WIDE RED SMALL 24 (CLIP) IMPLANT
CONN Y 3/8X3/8X3/8  BEN (MISCELLANEOUS)
CONN Y 3/8X3/8X3/8 BEN (MISCELLANEOUS) IMPLANT
CONT SPEC 4OZ CLIKSEAL STRL BL (MISCELLANEOUS) ×1 IMPLANT
COVER SURGICAL LIGHT HANDLE (MISCELLANEOUS) ×3 IMPLANT
CRADLE DONUT ADULT HEAD (MISCELLANEOUS) ×3 IMPLANT
DRAIN CHANNEL 32F RND 10.7 FF (WOUND CARE) ×5 IMPLANT
DRAPE BILATERAL SPLIT (DRAPES) IMPLANT
DRAPE CARDIOVASCULAR INCISE (DRAPES) ×3
DRAPE CV SPLIT W-CLR ANES SCRN (DRAPES) IMPLANT
DRAPE INCISE IOBAN 66X45 STRL (DRAPES) ×3 IMPLANT
DRAPE SLUSH/WARMER DISC (DRAPES) ×3 IMPLANT
DRAPE SRG 135X102X78XABS (DRAPES) ×2 IMPLANT
DRSG COVADERM 4X14 (GAUZE/BANDAGES/DRESSINGS) ×3 IMPLANT
ELECT REM PT RETURN 9FT ADLT (ELECTROSURGICAL) ×6
ELECTRODE REM PT RTRN 9FT ADLT (ELECTROSURGICAL) ×4 IMPLANT
GLOVE BIO SURGEON STRL SZ 6 (GLOVE) ×3 IMPLANT
GLOVE BIO SURGEON STRL SZ 6.5 (GLOVE) ×3 IMPLANT
GLOVE BIO SURGEON STRL SZ7 (GLOVE) IMPLANT
GLOVE BIO SURGEON STRL SZ7.5 (GLOVE) IMPLANT
GLOVE BIOGEL PI IND STRL 6 (GLOVE) IMPLANT
GLOVE BIOGEL PI IND STRL 6.5 (GLOVE) IMPLANT
GLOVE BIOGEL PI IND STRL 7.0 (GLOVE) IMPLANT
GLOVE BIOGEL PI INDICATOR 6 (GLOVE) ×2
GLOVE BIOGEL PI INDICATOR 6.5 (GLOVE)
GLOVE BIOGEL PI INDICATOR 7.0 (GLOVE)
GLOVE EUDERMIC 7 POWDERFREE (GLOVE) IMPLANT
GLOVE ORTHO TXT STRL SZ7.5 (GLOVE) ×9 IMPLANT
GOWN STRL NON-REIN LRG LVL3 (GOWN DISPOSABLE) ×14 IMPLANT
HEMOSTAT POWDER SURGIFOAM 1G (HEMOSTASIS) ×9 IMPLANT
INSERT FOGARTY 61MM (MISCELLANEOUS) IMPLANT
INSERT FOGARTY XLG (MISCELLANEOUS) ×3 IMPLANT
KIT BASIN OR (CUSTOM PROCEDURE TRAY) ×3 IMPLANT
KIT ROOM TURNOVER OR (KITS) ×3 IMPLANT
KIT SUCTION CATH 14FR (SUCTIONS) ×15 IMPLANT
KIT VASOVIEW W/TROCAR VH 2000 (KITS) ×3 IMPLANT
LEAD PACING MYOCARDI (MISCELLANEOUS) ×3 IMPLANT
LINE VENT (MISCELLANEOUS) ×1 IMPLANT
MARKER GRAFT CORONARY BYPASS (MISCELLANEOUS) ×9 IMPLANT
NS IRRIG 1000ML POUR BTL (IV SOLUTION) ×17 IMPLANT
PACK OPEN HEART (CUSTOM PROCEDURE TRAY) ×3 IMPLANT
PAD ARMBOARD 7.5X6 YLW CONV (MISCELLANEOUS) ×6 IMPLANT
PAD ELECT DEFIB RADIOL ZOLL (MISCELLANEOUS) ×3 IMPLANT
PENCIL BUTTON HOLSTER BLD 10FT (ELECTRODE) ×3 IMPLANT
PUNCH AORTIC ROTATE 4.0MM (MISCELLANEOUS) IMPLANT
PUNCH AORTIC ROTATE 4.5MM 8IN (MISCELLANEOUS) IMPLANT
PUNCH AORTIC ROTATE 5MM 8IN (MISCELLANEOUS) IMPLANT
SET CARDIOPLEGIA MPS 5001102 (MISCELLANEOUS) ×1 IMPLANT
SET IRRIG TUBING LAPAROSCOPIC (IRRIGATION / IRRIGATOR) ×3 IMPLANT
SOLUTION ANTI FOG 6CC (MISCELLANEOUS) IMPLANT
SPONGE GAUZE 4X4 12PLY (GAUZE/BANDAGES/DRESSINGS) ×5 IMPLANT
SPONGE LAP 18X18 X RAY DECT (DISPOSABLE) IMPLANT
SPONGE LAP 4X18 X RAY DECT (DISPOSABLE) IMPLANT
SUT BONE WAX W31G (SUTURE) ×3 IMPLANT
SUT ETHIBON 2 0 V 52N 30 (SUTURE) ×6 IMPLANT
SUT ETHIBON EXCEL 2-0 V-5 (SUTURE) IMPLANT
SUT ETHIBOND 2 0 SH (SUTURE)
SUT ETHIBOND 2 0 SH 36X2 (SUTURE) IMPLANT
SUT ETHIBOND 2 0 V4 (SUTURE) IMPLANT
SUT ETHIBOND 2 0V4 GREEN (SUTURE) IMPLANT
SUT ETHIBOND 4 0 RB 1 (SUTURE) IMPLANT
SUT ETHIBOND NAB MH 2-0 36IN (SUTURE) ×1 IMPLANT
SUT ETHIBOND V-5 VALVE (SUTURE) IMPLANT
SUT ETHIBOND X763 2 0 SH 1 (SUTURE) ×9 IMPLANT
SUT MNCRL AB 3-0 PS2 18 (SUTURE) ×6 IMPLANT
SUT MNCRL AB 4-0 PS2 18 (SUTURE) IMPLANT
SUT PDS AB 1 CTX 36 (SUTURE) ×6 IMPLANT
SUT PROLENE 2 0 SH DA (SUTURE) IMPLANT
SUT PROLENE 3 0 SH DA (SUTURE) ×3 IMPLANT
SUT PROLENE 3 0 SH1 36 (SUTURE) ×2 IMPLANT
SUT PROLENE 4 0 RB 1 (SUTURE) ×12
SUT PROLENE 4 0 SH DA (SUTURE) ×4 IMPLANT
SUT PROLENE 4-0 RB1 .5 CRCL 36 (SUTURE) ×4 IMPLANT
SUT PROLENE 5 0 C 1 36 (SUTURE) IMPLANT
SUT PROLENE 6 0 C 1 30 (SUTURE) ×2 IMPLANT
SUT PROLENE 7.0 RB 3 (SUTURE) ×9 IMPLANT
SUT PROLENE 8 0 BV175 6 (SUTURE) IMPLANT
SUT PROLENE BLUE 7 0 (SUTURE) ×3 IMPLANT
SUT PROLENE POLY MONO (SUTURE) IMPLANT
SUT SILK  1 MH (SUTURE) ×3
SUT SILK 1 MH (SUTURE) ×2 IMPLANT
SUT SILK 2 0 SH CR/8 (SUTURE) IMPLANT
SUT SILK 3 0 SH CR/8 (SUTURE) IMPLANT
SUT STEEL 6MS V (SUTURE) ×1 IMPLANT
SUT STEEL STERNAL CCS#1 18IN (SUTURE) IMPLANT
SUT STEEL SZ 6 DBL 3X14 BALL (SUTURE) ×2 IMPLANT
SUT VIC AB 1 CTX 36 (SUTURE)
SUT VIC AB 1 CTX36XBRD ANBCTR (SUTURE) IMPLANT
SUT VIC AB 2-0 CT1 27 (SUTURE)
SUT VIC AB 2-0 CT1 TAPERPNT 27 (SUTURE) IMPLANT
SUT VIC AB 2-0 CTX 27 (SUTURE) IMPLANT
SUT VIC AB 3-0 SH 27 (SUTURE)
SUT VIC AB 3-0 SH 27X BRD (SUTURE) IMPLANT
SUT VIC AB 3-0 X1 27 (SUTURE) IMPLANT
SUT VICRYL 4-0 PS2 18IN ABS (SUTURE) IMPLANT
SUTURE E-PAK OPEN HEART (SUTURE) ×3 IMPLANT
SYSTEM SAHARA CHEST DRAIN ATS (WOUND CARE) ×3 IMPLANT
TAPE CLOTH SURG 4X10 WHT LF (GAUZE/BANDAGES/DRESSINGS) ×1 IMPLANT
TAPE PAPER 2X10 WHT MICROPORE (GAUZE/BANDAGES/DRESSINGS) ×1 IMPLANT
TOWEL OR 17X24 6PK STRL BLUE (TOWEL DISPOSABLE) ×6 IMPLANT
TOWEL OR 17X26 10 PK STRL BLUE (TOWEL DISPOSABLE) ×6 IMPLANT
TRAY FOLEY IC TEMP SENS 14FR (CATHETERS) ×3 IMPLANT
TUBING INSUFFLATION 10FT LAP (TUBING) ×3 IMPLANT
UNDERPAD 30X30 INCONTINENT (UNDERPADS AND DIAPERS) ×3 IMPLANT
VALVE MAGNA EASE AORTIC 25MM (Prosthesis & Implant Heart) ×1 IMPLANT
VENT LEFT HEART 12002 (CATHETERS) ×3
WATER STERILE IRR 1000ML POUR (IV SOLUTION) ×6 IMPLANT

## 2013-08-20 NOTE — Interval H&P Note (Signed)
History and Physical Interval Note:  08/20/2013 6:55 AM  Jeffery [name]  has presented today for surgery, with the diagnosis of CAD AS  The various methods of treatment have been discussed with the patient and family. After consideration of risks, benefits and other options for treatment, the patient has consented to  Procedure(s): AORTIC VALVE REPLACEMENT (AVR) (N/A) CORONARY ARTERY BYPASS GRAFTING (CABG) (N/A) INTRAOPERATIVE TRANSESOPHAGEAL ECHOCARDIOGRAM (N/A) as a surgical intervention .  The patient's history has been reviewed, patient examined, no change in status, stable for surgery.  I have reviewed the patient's chart and labs.  Questions were answered to the patient's satisfaction.     [name],[name]

## 2013-08-20 NOTE — Op Note (Signed)
CARDIOTHORACIC SURGERY OPERATIVE NOTE  Date of Procedure:  08/20/2013  Preoperative Diagnosis:   Severe Aortic Stenosis  Severe Single-vessel Coronary Artery Disease  Postoperative Diagnosis: Same  Procedure:    Aortic Valve Replacement  Edwards Magna Ease Pericardial Tissue Valve (size 25mm, model # 3300TFX, serial # [id number])   Coronary Artery Bypass Grafting x 1  Left Internal Mammary Artery to Distal Left Anterior Descending Coronary Artery  Surgeon: Salvatore Decent. Cornelius Moras, MD  Assistant: Coral Ceo, PA-C  Anesthesia: [name], MD  Operative Findings: Bicuspid aortic valve w/ severe aortic stenosis  Normal LV systolic function  Good quality LIMA conduit for grafting  Good quality target vessel for grafting        BRIEF CLINICAL NOTE AND INDICATIONS FOR SURGERY  Patient is a 57 year old white male from [place] with known history of bicuspid aortic valve and aortic stenosis who has been referred for elective surgical consultation. The patient states that he was first noted to have a heart murmur on physical exam approximately 15 years ago when he presented with symptomatic cholelithiasis. He was diagnosed with a bicuspid aortic valve at that time and he subsequently underwent elective cholecystectomy without complication. He has been followed for the last several years by Dr. Anne Fu with serial echocardiograms. Over the last few years the patient has began to experience exertional shortness of breath and chest discomfort. A followup echocardiogram was performed 07/02/2013 that reportedly demonstrates the presence of severe aortic stenosis with peak velocity across the aortic valve measured 4.18 m/s corresponding to estimated mean transvalvular gradient of 42 millimeters mercury. Left ventricular systolic function remains normal with ejection fraction estimated 60-65%. There was mild aortic root enlargement with the sinotubular junction measured 4.2 cm. There was mild  aortic regurgitation and severe calcification of the aortic valve. The severity of aortic stenosis had notably progressed significantly since the last previous echocardiogram performed in September 2013. The patient was referred for elective surgical consultation and originally seen in on 07/16/2013. Since then he underwent CT angiogram of the chest on 07/23/2013, and he underwent left and right heart catheterization by Dr. Anne Fu.  The patient has been seen in consultation and counseled at length regarding the indications, risks and potential benefits of surgery.  All questions have been answered, and the patient provides full informed consent for the operation as described.     DETAILS OF THE OPERATIVE PROCEDURE  Preparation:  The patient is brought to the operating room on the above mentioned date and central monitoring was established by the anesthesia team including placement of Swan-Ganz catheter and radial arterial line. The patient is placed in the supine position on the operating table.  Intravenous antibiotics are administered. General endotracheal anesthesia is induced uneventfully. A Foley catheter is placed.  Baseline transesophageal echocardiogram was performed.  Findings were notable for bicuspid aortic valve with fusion of the left and right cusps and severe aortic stenosis.  There was mild to moderate aortic insufficiency and normal LV systolic function.  The patient's chest, abdomen, both groins, and both lower extremities are prepared and draped in a sterile manner. A time out procedure is performed.   Surgical Approach and Conduit Harvest:  A median sternotomy incision was performed and the left internal mammary artery is dissected from the chest wall and prepared for bypass grafting. The left internal mammary artery is notably good quality conduit.  Following systemic heparinization, the left internal mammary artery was transected distally noted to have excellent  flow.   Extracorporeal Cardiopulmonary Bypass and  Myocardial Protection:  The pericardium is opened. The ascending aorta is mildly dilated in appearance. The maximum diameter is < 4.0 cm.  The ascending aorta and the right atrium are cannulated for cardioplegia bypass.  Adequate heparinization is verified.   A retrograde cardioplegia cannula is placed through the right atrium into the coronary sinus.  The operative field was continuously flooded with carbon dioxide gas.  The entire pre-bypass portion of the operation was notable for stable hemodynamics.  Cardiopulmonary bypass was begun and a left ventricular vent placed through the right superior pulmonary vein.  The surface of the heart inspected. Distal target vessels are selected for coronary artery bypass grafting. A cardioplegia cannula is placed in the ascending aorta.  A temperature probe was placed in the interventricular septum.  The patient is cooled to 32C systemic temperature.  The aortic cross clamp is applied and cold blood cardioplegia is delivered initially in an antegrade fashion through the aortic root.  Supplemental cardioplegia is given retrograde through the coronary sinus catheter.  Iced saline slush is applied for topical hypothermia.  The initial cardioplegic arrest is rapid with early diastolic arrest.  Repeat doses of cardioplegia are administered intermittently throughout the entire cross clamp portion of the operation through through the coronary sinus catheter in order to maintain completely flat electrocardiogram and septal myocardial temperature below 15C.  Myocardial protection was felt to be excellent.   Coronary Artery Bypass Grafting:   The distal left anterior coronary artery was grafted with the left internal mammary artery in an end-to-side fashion.  At the site of distal anastomosis the target vessel was good quality and measured approximately 2.0 mm in diameter.   Aortic Valve Replacement:  An oblique  transverse aortotomy incision was performed.  The aortic valve was inspected and notable for congenitally bicuspid valve with fusion of the left and right cusps.  There was severe aortic stenosis.  The aortic valve leaflets were excised sharply and the aortic annulus decalcified.  Decalcification was notably challenging due to extensive thick calcification.  The aortic annulus was sized to accept a 25 mm prosthesis.  The aortic root and left ventricle were irrigated with copious cold saline solution.  Aortic valve replacement was performed using interrupted horizontal mattress 2-0 Ethibond pledgeted sutures with pledgets in the subannular position.  An [facility] Ease pericardial tissue valve (size 25 mm, model # 3300TFX, serial # [id number]) was implanted uneventfully. The valve seated appropriately with adequate space beneath the left main and right coronary artery.  The aortotomy was closed using a 2-layer closure of running 4-0 Prolene suture with Teflon felt strips placed circumferentially around the entire aorta in an effort to prevent late aneurysmal enlargement or propagation of aortic dissection.   Procedure Completion:  The septal myocardial temperature rose rapidly after reperfusion of the left internal mammary artery graft.  One final dose of warm retrograde "hot shot" cardioplegia was administered through the coronary sinus catheter while all air was evacuated through the aortic root.  The aortic cross clamp was removed after a total cross clamp time of 77 minutes.  The distal coronary anastomosis was inspected for hemostasis and appropriate graft orientation. Epicardial pacing wires are fixed to the right ventricular outflow tract and to the right atrial appendage. The patient is rewarmed to 37C temperature. The aortic and left ventricular vents were removed.  The patient is weaned and disconnected from cardiopulmonary bypass.  The patient's rhythm at separation from bypass was AV paced.   The patient was weaned  from cardioplegic bypass without any inotropic support. Total cardiopulmonary bypass time for the operation was 99 minutes.  Followup transesophageal echocardiogram performed after separation from bypass revealed a well-seated bioprosthetic tissue valve in the aortic position that was functioning normally.  There was no perivalvular leak.  There were otherwise no changes from the preoperative exam.  The aortic and venous cannula were removed uneventfully. Protamine was administered to reverse the anticoagulation. The mediastinum and pleural space were inspected for hemostasis and irrigated with saline solution. The mediastinum and the left pleural space were drained using 3 chest tubes placed through separate stab incisions inferiorly.  The soft tissues anterior to the aorta were reapproximated loosely. The sternum is closed with double strength sternal wire. The soft tissues anterior to the sternum were closed in multiple layers and the skin is closed with a running subcuticular skin closure.  The post-bypass portion of the operation was notable for stable rhythm and hemodynamics.  No blood products were administered during the operation.   Disposition:  The patient tolerated the procedure well and is transported to the surgical intensive care in stable condition. There are no intraoperative complications. All sponge instrument and needle counts are verified correct at completion of the operation.   Salvatore Decent. Cornelius Moras MD 08/20/2013 1:17 PM

## 2013-08-20 NOTE — OR Nursing (Signed)
13:05 - 2nd call to SICU, call to vol. Desk to inform family off pump

## 2013-08-20 NOTE — Progress Notes (Signed)
ETT secured on the right side with pink tape. No breakdown noted on lips.

## 2013-08-20 NOTE — Brief Op Note (Addendum)
08/20/2013  12:11 PM  PATIENT:  [name]  57 y.o. male  PRE-OPERATIVE DIAGNOSIS:  Single vessel CAD, severe AS  POST-OPERATIVE DIAGNOSIS:  Single vessel CAD, severe AS  PROCEDURE:   AORTIC VALVE REPLACEMENT (25 mm Edwards Magna Ease pericardial tissue valve) CORONARY ARTERY BYPASS GRAFTING (LIMA to LAD)  SURGEON:    Purcell Nails, MD  ASSISTANTS:  Coral Ceo, PA-C  ANESTHESIA:   [name], MD  CROSSCLAMP TIME:   32'  CARDIOPULMONARY BYPASS TIME: 8'  FINDINGS:  Bicuspid aortic valve w/ severe aortic stenosis  Normal LV systolic function  Good quality LIMA conduit for grafting  Good quality target vessel for grafting  COMPLICATIONS: None  BASELINE WEIGHT: 92 kg  PATIENT DISPOSITION:   TO SICU IN STABLE CONDITION  [name],[name] 08/20/2013 1:13 PM

## 2013-08-20 NOTE — Progress Notes (Signed)
Patient ID: Jeffery [name], male   DOB: 08-31-1956, 57 y.o.   MRN: [id number] EVENING ROUNDS NOTE :     [address]       [place] [address]             [phone]                 Day of Surgery Procedure(s) (LRB): AORTIC VALVE REPLACEMENT (AVR) (N/A) CORONARY ARTERY BYPASS GRAFTING (CABG) (N/A) INTRAOPERATIVE TRANSESOPHAGEAL ECHOCARDIOGRAM (N/A)  Total Length of Stay:  LOS: 0 days  BP 96/70  Pulse 79  Temp(Src) 99.5 F (37.5 C) (Core (Comment))  Resp 19  Ht 5\' 10"  (1.778 m)  Wt 202 lb 6.4 oz (91.808 kg)  BMI 29.04 kg/m2  SpO2 99%  .Intake/Output     11/11 0701 - 11/12 0700 11/12 0701 - 11/13 0700   I.V. (mL/kg)  2225.9 (24.2)   Blood  365   NG/GT  30   IV Piggyback  1150   Total Intake(mL/kg)  3770.9 (41.1)   Urine (mL/kg/hr)  1875 (1.8)   Emesis/NG output  100 (0.1)   Blood  675 (0.6)   Chest Tube  210 (0.2)   Total Output   2860   Net   +910.9          . sodium chloride 20 mL/hr (08/20/13 1345)  . sodium chloride 20 mL/hr (08/20/13 1345)  . [START ON 08/21/2013] sodium chloride    . dexmedetomidine Stopped (08/20/13 1830)  . insulin (NOVOLIN-R) infusion 1.2 Units/hr (08/20/13 1800)  . lactated ringers 20 mL/hr (08/20/13 1345)  . nitroGLYCERIN Stopped (08/20/13 1630)  . phenylephrine (NEO-SYNEPHRINE) Adult infusion Stopped (08/20/13 1345)     Lab Results  Component Value Date   WBC 11.7* 08/20/2013   HGB 12.5* 08/20/2013   HCT 34.9* 08/20/2013   PLT 130* 08/20/2013   GLUCOSE 94 08/20/2013   ALT 34 08/18/2013   AST 28 08/18/2013   NA 142 08/20/2013   K 3.3* 08/20/2013   CL 104 08/18/2013   CREATININE 0.91 08/18/2013   BUN 13 08/18/2013   CO2 20 08/18/2013   INR 1.47 08/20/2013   HGBA1C 5.4 08/18/2013   Now extubated neuro intact     Delight Ovens MD  Beeper [phone] Office [phone] 08/20/2013 6:33 PM

## 2013-08-20 NOTE — OR Nursing (Signed)
12:30pm - 1st call to SICU.

## 2013-08-20 NOTE — Transfer of Care (Signed)
Immediate Anesthesia Transfer of Care Note  Patient: Jeffery [name]  Procedure(s) Performed: Procedure(s) with comments: AORTIC VALVE REPLACEMENT (AVR) (N/A) CORONARY ARTERY BYPASS GRAFTING (CABG) (N/A) - CABG x one, using left internal mammary artery INTRAOPERATIVE TRANSESOPHAGEAL ECHOCARDIOGRAM (N/A)  Patient Location: SICU  Anesthesia Type:General  Level of Consciousness: Patient remains intubated per anesthesia plan  Airway & Oxygen Therapy: Patient remains intubated per anesthesia plan and Patient placed on Ventilator (see vital sign flow sheet for setting)  Post-op Assessment: Report given to ICU RN  Post vital signs: Reviewed and stable  Complications: No apparent anesthesia complications

## 2013-08-20 NOTE — Procedures (Signed)
Extubation Procedure Note  Patient Details:   Name: Jeffery [name] DOB: 05-Apr-1956 MRN: [id number]   Airway Documentation:     Evaluation  O2 sats: stable throughout Complications: No apparent complications Patient did tolerate procedure well. Bilateral Breath Sounds: Clear;Diminished   Yes  Pt tolerated rapid wean, VC, -20 NIF, positive for cuff leak, extubated to 4lpm [place]. No dyspnea or stridor noted after extubation. O2 titrated to 2lpm, pt resting comfortably at this time. All vitals are within normal limits at this time. RT Will continue to monitor.   Beatris Si 08/20/2013, 6:01 PM

## 2013-08-20 NOTE — Anesthesia Preprocedure Evaluation (Addendum)
Anesthesia Evaluation  Patient identified by MRN, date of birth, ID band Patient awake    Reviewed: Allergy & Precautions, H&P , NPO status , Patient's Chart, lab work & pertinent test results  Airway Mallampati: I TM Distance: >3 FB Neck ROM: Full    Dental  (+) Dental Advisory Given and Teeth Intact   Pulmonary shortness of breath, former smoker,  breath sounds clear to auscultation        Cardiovascular hypertension, + CAD and +CHF + Valvular Problems/Murmurs AS Rhythm:Regular Rate:Normal + Systolic murmurs    Neuro/Psych    GI/Hepatic   Endo/Other    Renal/GU      Musculoskeletal   Abdominal   Peds  Hematology   Anesthesia Other Findings   Reproductive/Obstetrics                          Anesthesia Physical Anesthesia Plan  ASA: IV  Anesthesia Plan: General   Post-op Pain Management:    Induction: Intravenous  Airway Management Planned: Oral ETT  Additional Equipment: Arterial line, CVP, PA Cath and 3D TEE  Intra-op Plan:   Post-operative Plan: Extubation in OR  Informed Consent: I have reviewed the patients History and Physical, chart, labs and discussed the procedure including the risks, benefits and alternatives for the proposed anesthesia with the patient or authorized representative who has indicated his/her understanding and acceptance.   Dental advisory given  Plan Discussed with: Surgeon and Anesthesiologist  Anesthesia Plan Comments:         Anesthesia Quick Evaluation

## 2013-08-20 NOTE — Progress Notes (Signed)
  Echocardiogram Echocardiogram Transesophageal has been performed.  Jeffery [name] 08/20/2013, 9:28 AM 

## 2013-08-20 NOTE — Anesthesia Postprocedure Evaluation (Signed)
  Anesthesia Post-op Note  Patient: Jeffery [name]  Procedure(s) Performed: Procedure(s) with comments: AORTIC VALVE REPLACEMENT (AVR) (N/A) CORONARY ARTERY BYPASS GRAFTING (CABG) (N/A) - CABG x one, using left internal mammary artery INTRAOPERATIVE TRANSESOPHAGEAL ECHOCARDIOGRAM (N/A)  Patient Location: SICU  Anesthesia Type:General  Level of Consciousness: sedated and unresponsive  Airway and Oxygen Therapy: Patient remains intubated per anesthesia plan and Patient placed on Ventilator (see vital sign flow sheet for setting)  Post-op Pain: none  Post-op Assessment: Post-op Vital signs reviewed, Patient's Cardiovascular Status Stable and Respiratory Function Stable  Post-op Vital Signs: stable  Complications: No apparent anesthesia complications

## 2013-08-20 NOTE — Preoperative (Signed)
Beta Blockers   Reason not to administer Beta Blockers:Not Applicable 

## 2013-08-21 ENCOUNTER — Inpatient Hospital Stay (HOSPITAL_COMMUNITY): Payer: BC Managed Care – PPO

## 2013-08-21 DIAGNOSIS — I2510 Atherosclerotic heart disease of native coronary artery without angina pectoris: Secondary | ICD-10-CM

## 2013-08-21 LAB — MAGNESIUM
Magnesium: 2.4 mg/dL (ref 1.5–2.5)
Magnesium: 2.2 mg/dL (ref 1.5–2.5)

## 2013-08-21 LAB — GLUCOSE, CAPILLARY
Glucose-Capillary: 108 mg/dL — ABNORMAL HIGH (ref 70–99)
Glucose-Capillary: 121 mg/dL — ABNORMAL HIGH (ref 70–99)
Glucose-Capillary: 124 mg/dL — ABNORMAL HIGH (ref 70–99)
Glucose-Capillary: 126 mg/dL — ABNORMAL HIGH (ref 70–99)
Glucose-Capillary: 128 mg/dL — ABNORMAL HIGH (ref 70–99)
Glucose-Capillary: 136 mg/dL — ABNORMAL HIGH (ref 70–99)
Glucose-Capillary: 143 mg/dL — ABNORMAL HIGH (ref 70–99)
Glucose-Capillary: 145 mg/dL — ABNORMAL HIGH (ref 70–99)
Glucose-Capillary: 112 mg/dL — ABNORMAL HIGH (ref 70–99)
Glucose-Capillary: 113 mg/dL — ABNORMAL HIGH (ref 70–99)
Glucose-Capillary: 60 mg/dL — ABNORMAL LOW (ref 70–99)
Glucose-Capillary: 105 mg/dL — ABNORMAL HIGH (ref 70–99)
Glucose-Capillary: 110 mg/dL — ABNORMAL HIGH (ref 70–99)
Glucose-Capillary: 111 mg/dL — ABNORMAL HIGH (ref 70–99)
Glucose-Capillary: 115 mg/dL — ABNORMAL HIGH (ref 70–99)
Glucose-Capillary: 117 mg/dL — ABNORMAL HIGH (ref 70–99)
Glucose-Capillary: 127 mg/dL — ABNORMAL HIGH (ref 70–99)
Glucose-Capillary: 108 mg/dL — ABNORMAL HIGH (ref 70–99)

## 2013-08-21 LAB — POCT I-STAT, CHEM 8
BUN: 14 mg/dL (ref 6–23)
Calcium, Ion: 1.22 mmol/L (ref 1.12–1.23)
Chloride: 99 mEq/L (ref 96–112)
Creatinine, Ser: 1 mg/dL (ref 0.50–1.35)
Glucose, Bld: 117 mg/dL — ABNORMAL HIGH (ref 70–99)
Potassium: 3.7 mEq/L (ref 3.5–5.1)

## 2013-08-21 LAB — BASIC METABOLIC PANEL
BUN: 14 mg/dL (ref 6–23)
CO2: 23 mEq/L (ref 19–32)
Calcium: 7.9 mg/dL — ABNORMAL LOW (ref 8.4–10.5)
Chloride: 107 mEq/L (ref 96–112)
Creatinine, Ser: 0.91 mg/dL (ref 0.50–1.35)
Glucose, Bld: 126 mg/dL — ABNORMAL HIGH (ref 70–99)
Sodium: 140 mEq/L (ref 135–145)

## 2013-08-21 LAB — CBC
HCT: 35.7 % — ABNORMAL LOW (ref 39.0–52.0)
HCT: 34 % — ABNORMAL LOW (ref 39.0–52.0)
Hemoglobin: 12.1 g/dL — ABNORMAL LOW (ref 13.0–17.0)
MCH: 30 pg (ref 26.0–34.0)
MCHC: 35.6 g/dL (ref 30.0–36.0)
MCV: 87.1 fL (ref 78.0–100.0)
Platelets: 144 10*3/uL — ABNORMAL LOW (ref 150–400)
RBC: 4.1 MIL/uL — ABNORMAL LOW (ref 4.22–5.81)
RBC: 3.92 MIL/uL — ABNORMAL LOW (ref 4.22–5.81)
RDW: 12.7 % (ref 11.5–15.5)
WBC: 12.8 10*3/uL — ABNORMAL HIGH (ref 4.0–10.5)

## 2013-08-21 LAB — CREATININE, SERUM
Creatinine, Ser: 0.81 mg/dL (ref 0.50–1.35)
GFR calc Af Amer: >90 mL/min (ref >90)
GFR calc non Af Amer: >90 mL/min (ref >90)

## 2013-08-21 MED ORDER — FUROSEMIDE 10 MG/ML IJ SOLN: 20.0000 mg | Freq: Four times a day (QID) | INTRAMUSCULAR | Status: DC

## 2013-08-21 MED ORDER — INSULIN ASPART 100 UNIT/ML ~~LOC~~ SOLN
0.0000 [IU] | SUBCUTANEOUS | Status: DC
Start: 2013-08-21 — End: 2013-08-22

## 2013-08-21 MED ORDER — POTASSIUM CHLORIDE 10 MEQ/50ML IV SOLN
10.0000 meq | INTRAVENOUS | Status: AC
  Administered 2013-08-21 (×3): 10 meq via INTRAVENOUS
  Filled 2013-08-21 (×2): qty 50

## 2013-08-21 MED ORDER — FUROSEMIDE 10 MG/ML IJ SOLN
20.0000 mg | Freq: Once | INTRAMUSCULAR | Status: AC
  Administered 2013-08-21: 20 mg via INTRAVENOUS
  Filled 2013-08-21: qty 2

## 2013-08-21 MED ORDER — MIDAZOLAM HCL 2 MG/2ML IJ SOLN
2.0000 mg | Freq: Once | INTRAMUSCULAR | Status: AC
  Administered 2013-08-21: 2 mg via INTRAVENOUS
  Filled 2013-08-21: qty 2

## 2013-08-21 MED ORDER — KETOROLAC TROMETHAMINE 30 MG/ML IJ SOLN
30.0000 mg | Freq: Four times a day (QID) | INTRAMUSCULAR | Status: AC
  Administered 2013-08-21 – 2013-08-22 (×5): 30 mg via INTRAVENOUS
  Filled 2013-08-21 (×5): qty 1

## 2013-08-21 MED ORDER — MORPHINE SULFATE 2 MG/ML IJ SOLN
2.0000 mg | INTRAMUSCULAR | Status: DC | PRN
  Administered 2013-08-21: 2 mg via INTRAVENOUS
  Filled 2013-08-21: qty 1

## 2013-08-21 MED FILL — Magnesium Sulfate Inj 50%: INTRAMUSCULAR | Qty: 10 | Status: AC

## 2013-08-21 MED FILL — Heparin Sodium (Porcine) Inj 1000 Unit/ML: INTRAMUSCULAR | Qty: 30 | Status: AC

## 2013-08-21 MED FILL — Sodium Chloride IV Soln 0.9%: INTRAVENOUS | Qty: 1000 | Status: AC

## 2013-08-21 MED FILL — Potassium Chloride Inj 2 mEq/ML: INTRAVENOUS | Qty: 40 | Status: AC

## 2013-08-21 NOTE — Progress Notes (Signed)
Sitting in chair.  NSR on ECG Post op soreness in chest. AVR and LIMA to LAD  Appreciate TCTS team care.

## 2013-08-21 NOTE — Progress Notes (Signed)
      [address]       [place] [address]             [phone]        CARDIOTHORACIC SURGERY PROGRESS NOTE   R1 Day Post-Op Procedure(s) (LRB): AORTIC VALVE REPLACEMENT (AVR) (N/A) CORONARY ARTERY BYPASS GRAFTING (CABG) (N/A) INTRAOPERATIVE TRANSESOPHAGEAL ECHOCARDIOGRAM (N/A)  Subjective: Appropriate soreness in chest.  Otherwise doing quite well.  Objective: Vital signs: BP Readings from Last 1 Encounters:  08/21/13 98/67   Pulse Readings from Last 1 Encounters:  08/21/13 80   Resp Readings from Last 1 Encounters:  08/21/13 21   Temp Readings from Last 1 Encounters:  08/21/13 99 F (37.2 C)     Hemodynamics: PAP: (21-45)/(9-24) 29/13 mmHg CO:  [3.9 L/min-6.9 L/min] 6.3 L/min CI:  [2 L/min/m2-8.2 L/min/m2] 3 L/min/m2  Physical Exam:  Rhythm:   sinus  Breath sounds: clear  Heart sounds:  RRR  Incisions:  Dressing dry, intact  Abdomen:  Soft, non-distended, non-tender  Extremities:  Warm, well-perfused    Intake/Output from previous day: 11/12 0701 - 11/13 0700 In: 4891.7 [I.V.:2596.7; Blood:365; NG/GT:30; IV Piggyback:1900] Out: 3792 [Urine:2615; Emesis/NG output:100; Blood:675; Chest Tube:402] Intake/Output this shift:    Lab Results:  CBC: Recent Labs  08/20/13 1945 08/20/13 1951 08/21/13 0355  WBC 14.5*  --  11.7*  HGB 13.0 12.2* 12.3*  HCT 36.1* 36.0* 35.7*  PLT 148*  --  144*    BMET:  Recent Labs  08/18/13 1227  08/20/13 1951 08/21/13 0355  NA 138  < > 143 140  K 3.9  < > 4.4 4.0  CL 104  --  107 107  CO2 20  --   --  23  GLUCOSE 81  < > 127* 126*  BUN 13  --  16 14  CREATININE 0.91  < > 1.20 0.91  CALCIUM 9.4  --   --  7.9*  < > = values in this interval not displayed.   CBG (last 3)   Recent Labs  08/21/13 0512 08/21/13 0514 08/21/13 0625  GLUCAP 60* 117* 122*    ABG    Component Value Date/Time   PHART 7.349* 08/20/2013 1857   PCO2ART 41.4 08/20/2013 1857   PO2ART 100.0 08/20/2013 1857     HCO3 22.7 08/20/2013 1857   TCO2 22 08/20/2013 1951   ACIDBASEDEF 3.0* 08/20/2013 1857   O2SAT 97.0 08/20/2013 1857    CXR: Looks good.  Mild bibasilar atelectasis and pulm vasc congestion, L>R  Assessment/Plan: S/P Procedure(s) (LRB): AORTIC VALVE REPLACEMENT (AVR) (N/A) CORONARY ARTERY BYPASS GRAFTING (CABG) (N/A) INTRAOPERATIVE TRANSESOPHAGEAL ECHOCARDIOGRAM (N/A)  Doing well POD1 Expected post op acute blood loss anemia, very mild Expected post op volume excess, mild Expected post op atelectasis, mild   Mobilize  Diuresis  Pulm toilet  Possible transfer step down later today   Steffani Dionisio H 08/21/2013 7:49 AM

## 2013-08-21 NOTE — Progress Notes (Signed)
PM ROUNDS  Comfortable  BP 111/75  Pulse 71  Temp(Src) 98.1 F (36.7 C) (Oral)  Resp 21  Ht 5\' 10"  (1.778 m)  Wt 202 lb 6.4 oz (91.808 kg)  BMI 29.04 kg/m2  SpO2 98%   Intake/Output Summary (Last 24 hours) at 08/21/13 1709 Last data filed at 08/21/13 1700  Gross per 24 hour  Intake 1937.9 ml  Output   2362 ml  Net -424.1 ml    CT just removed  K=3.7 - receiving KCL HCT= 34  Doing well POD # 1

## 2013-08-21 NOTE — Care Management Note (Signed)
    Page 1 of 1   08/21/2013     12:07:41 PM   CARE MANAGEMENT NOTE 08/21/2013  Patient:  Jeffery [name], Jeffery [name]   Account Number:  [id number]  Date Initiated:  08/20/2013  Documentation initiated by:  Avie Arenas  Subjective/Objective Assessment:   AVR and CABG post op     Action/Plan:   Anticipated DC Date:  08/25/2013   Anticipated DC Plan:  HOME W HOME HEALTH SERVICES      DC Planning Services  CM consult      Choice offered to / List presented to:             Status of service:  In process, will continue to follow Medicare Important Message given?   (If response is "NO", the following Medicare IM given date fields will be blank) Date Medicare IM given:   Date Additional Medicare IM given:    Discharge Disposition:    Per UR Regulation:  Reviewed for med. necessity/level of care/duration of stay  If discussed at Long Length of Stay Meetings, dates discussed:    Comments:  ContactLavi, Sheehan Spouse [phone]   [phone]                 Baltazar Apo Daughter [phone]                 Genrich,Stephanie Daughter [phone]  08-21-13 - 11am Avie Arenas, RNBSN [phone] Patient sitting up in chair.  Verified lives with wife and daughter and plan for discharge is to go home with one of them at least being with him 24/7. CM will continue to follow for further  needs.

## 2013-08-22 ENCOUNTER — Inpatient Hospital Stay (HOSPITAL_COMMUNITY): Payer: BC Managed Care – PPO

## 2013-08-22 LAB — CBC
HCT: 32.7 % — ABNORMAL LOW (ref 39.0–52.0)
MCH: 30.5 pg (ref 26.0–34.0)
MCHC: 35.2 g/dL (ref 30.0–36.0)
MCV: 86.7 fL (ref 78.0–100.0)
Platelets: 124 10*3/uL — ABNORMAL LOW (ref 150–400)
RDW: 12.9 % (ref 11.5–15.5)

## 2013-08-22 LAB — BASIC METABOLIC PANEL
BUN: 15 mg/dL (ref 6–23)
CO2: 26 mEq/L (ref 19–32)
Calcium: 8.2 mg/dL — ABNORMAL LOW (ref 8.4–10.5)
Chloride: 102 mEq/L (ref 96–112)
Creatinine, Ser: 0.82 mg/dL (ref 0.50–1.35)
Glucose, Bld: 101 mg/dL — ABNORMAL HIGH (ref 70–99)
Sodium: 137 mEq/L (ref 135–145)

## 2013-08-22 LAB — GLUCOSE, CAPILLARY: Glucose-Capillary: 101 mg/dL — ABNORMAL HIGH (ref 70–99)

## 2013-08-22 MED ORDER — FUROSEMIDE 40 MG PO TABS
40.0000 mg | ORAL_TABLET | Freq: Every day | ORAL | Status: DC
  Administered 2013-08-22 – 2013-08-24 (×3): 40 mg via ORAL
  Filled 2013-08-22 (×3): qty 1

## 2013-08-22 MED ORDER — MOVING RIGHT ALONG BOOK
Freq: Once | Status: AC
Start: 2013-08-22 — End: 2013-08-22
  Administered 2013-08-22: 10:00:00
  Filled 2013-08-22: qty 1

## 2013-08-22 MED ORDER — SODIUM CHLORIDE 0.9 % IV SOLN: 250.0000 mL | INTRAVENOUS | Status: DC | PRN

## 2013-08-22 MED ORDER — SODIUM CHLORIDE 0.9 % IJ SOLN
3.0000 mL | Freq: Two times a day (BID) | INTRAMUSCULAR | Status: DC
  Administered 2013-08-22 (×2): 3 mL via INTRAVENOUS
  Administered 2013-08-23: 11:00:00 via INTRAVENOUS
  Administered 2013-08-23: 3 mL via INTRAVENOUS

## 2013-08-22 MED ORDER — SODIUM CHLORIDE 0.9 % IJ SOLN: 3.0000 mL | INTRAMUSCULAR | Status: DC | PRN

## 2013-08-22 MED ORDER — TRAMADOL HCL 50 MG PO TABS: 50.0000 mg | ORAL_TABLET | ORAL | Status: DC | PRN

## 2013-08-22 MED ORDER — ASPIRIN EC 325 MG PO TBEC
325.0000 mg | DELAYED_RELEASE_TABLET | Freq: Every day | ORAL | Status: DC
  Administered 2013-08-22 – 2013-08-24 (×3): 325 mg via ORAL
  Filled 2013-08-22 (×3): qty 1

## 2013-08-22 MED ORDER — POTASSIUM CHLORIDE CRYS ER 20 MEQ PO TBCR
20.0000 meq | EXTENDED_RELEASE_TABLET | Freq: Every day | ORAL | Status: DC
  Administered 2013-08-22 – 2013-08-24 (×3): 20 meq via ORAL
  Filled 2013-08-22 (×3): qty 1

## 2013-08-22 NOTE — Progress Notes (Signed)
Pt transferred to 2W06. Tolerated transfer without difficulty. VSS. Pt with at bedside. Pt glasses only person item upon transfer. Bedside handoff with [facility] and [place], [place].

## 2013-08-22 NOTE — Progress Notes (Signed)
Pt ambulated approx. 350 feet with rolling walker. Pt tolerated very well, no SOB. Pt also ambulated on 2South before transferring to 2West.

## 2013-08-22 NOTE — Progress Notes (Signed)
Feels better today. Postop aortic valve replacement, LIMA to LAD bypass.

## 2013-08-22 NOTE — Progress Notes (Signed)
Anesthesiology Follow-up:  Awake and alert, neuro intact, in good spirits, ambulating with assistance.  VS: T-36.8 BP 115/75 RR 18 HR 76 (SR)   K-3.8 BUN/Cr. 15/0.82 H/H: 11.5/32.7 Platelets: 124,000  Extubated 4 hours post-op.  57 year old male 2 days S/P AVR and CABG x 1, stable post-op course.  [name]

## 2013-08-22 NOTE — Progress Notes (Addendum)
[facility] DAILY ICU PROGRESS NOTE                   [address]            [place] [address]          [phone]   2 Days Post-Op Procedure(s) (LRB): AORTIC VALVE REPLACEMENT (AVR) (N/A) CORONARY ARTERY BYPASS GRAFTING (CABG) (N/A) INTRAOPERATIVE TRANSESOPHAGEAL ECHOCARDIOGRAM (N/A)  Total Length of Stay:  LOS: 2 days   Subjective: Just back from walking around unit.  Feels well, no complaints.   Objective: Vital signs in last 24 hours: Temp:  [98 F (36.7 C)-98.8 F (37.1 C)] 98 F (36.7 C) (11/14 0300) Pulse Rate:  [64-77] 72 (11/14 0700) Cardiac Rhythm:  [-] Normal sinus rhythm (11/14 0800) Resp:  [14-28] 21 (11/14 0700) BP: (96-118)/(68-85) 114/77 mmHg (11/14 0700) SpO2:  [91 %-100 %] 96 % (11/14 0700) Arterial Line BP: (86-101)/(70-74) 89/70 mmHg (11/13 0930) Weight:  [206 lb (93.441 kg)] 206 lb (93.441 kg) (11/14 0500)  Filed Weights   08/19/13 1500 08/22/13 0500  Weight: 202 lb 6.4 oz (91.808 kg) 206 lb (93.441 kg)  BASELINE WEIGHT: 92 kg   Weight change:    Hemodynamic parameters for last 24 hours: PAP: (32-34)/(15-16) 32/15 mmHg CO:  [4.6 L/min] 4.6 L/min CI:  [2.2 L/min/m2] 2.2 L/min/m2  Intake/Output from previous day: 11/13 0701 - 11/14 0700 In: 912.5 [P.O.:390; I.V.:272.5; IV Piggyback:250] Out: 1610 [Urine:1410; Chest Tube:200]    CBGs 108-117-111-105-87-101  Current Meds: Scheduled Meds: . acetaminophen  1,000 mg Oral Q6H  . aspirin EC  325 mg Oral Daily  . bisacodyl  10 mg Oral Daily   Or  . bisacodyl  10 mg Rectal Daily  . cefUROXime (ZINACEF)  IV  1.5 g Intravenous Q12H  . docusate sodium  200 mg Oral Daily  . insulin aspart  0-24 Units Subcutaneous Q4H  . insulin regular  0-10 Units Intravenous TID WC  . metoprolol tartrate  12.5 mg Oral BID  . pantoprazole  40 mg Oral Daily  . sodium chloride  3 mL Intravenous Q12H   Continuous Infusions: . sodium chloride    . insulin (NOVOLIN-R) infusion Stopped (08/21/13  1400)   PRN Meds:.metoprolol, morphine injection, ondansetron (ZOFRAN) IV, oxyCODONE, sodium chloride   Physical Exam: General appearance: alert, cooperative and no distress Heart: regular rate and rhythm Lungs: clear to auscultation bilaterally Extremities: No significant LE edema Wound: Dressed and dry    Lab Results: CBC: Recent Labs  08/21/13 1609 08/22/13 0420  WBC 12.8* 11.1*  HGB 12.1* 11.5*  HCT 34.0* 32.7*  PLT 126* 124*   BMET:  Recent Labs  08/21/13 0355 08/21/13 1601 08/21/13 1609 08/22/13 0420  NA 140 139  --  137  K 4.0 3.7  --  3.8  CL 107 99  --  102  CO2 23  --   --  26  GLUCOSE 126* 117*  --  101*  BUN 14 14  --  15  CREATININE 0.91 1.00 0.81 0.82  CALCIUM 7.9*  --   --  8.2*    PT/INR:  Recent Labs  08/20/13 1400  LABPROT 17.4*  INR 1.47   Radiology: Dg Chest Port 1 View  08/22/2013   CLINICAL DATA:  Status post aortic valve replacement  EXAM: PORTABLE CHEST - 1 VIEW  COMPARISON:  08/21/2013  FINDINGS: Postsurgical changes are again seen. A right jugular central line is noted. The Swan-Ganz catheter has been removed  in the interval. A mediastinal drain and thoracostomy catheter have also been removed in the interval. No recurrent pneumothorax is seen. No focal infiltrate is noted.  IMPRESSION: Interval removal of tubes and lines as described. No acute abnormality is noted.   Electronically Signed   By: Alcide Clever M.D.   On: 08/22/2013 07:27   Dg Chest Portable 1 View In Am  08/21/2013   CLINICAL DATA:  Post aortic valve replacement and coronary artery bypass graft surgery  EXAM: PORTABLE CHEST - 1 VIEW  COMPARISON:  Prior chest x-ray 08/20/2013  FINDINGS: The patient has been extubated and the nasogastric tube removed. Right IJ vascular sheath continues to convey a Swan-Ganz catheter into the heart. The tip of the Ernestine Conrad is pulled back slightly but remains within the proximal right main pulmonary artery. Mediastinal and left thoracostomy tubes  remain in unchanged position. Stable cardiomegaly. Slightly increased prominence of the right mediastinal soft tissues likely reflecting a venous congestion in the IVC and right atrial appendage. Surgical changes of median sternotomy with aortic valve replacement and multivessel CABG. Stable low lung volumes and left perihilar and basilar atelectasis. Negative for pulmonary edema, large effusion or pneumothorax. No acute osseous abnormality.  IMPRESSION: 1. Essentially stable appearance of the chest with low lung volumes and left perihilar and basilar atelectasis following extubation and removal of the nasogastric tube. 2. Other support apparatus in stable and satisfactory position. 3. Slightly increased prominence of the right mediastinal contours suggests increased distension of the superior vena cava and right atrial appendage.   Electronically Signed   By: Malachy Moan M.D.   On: 08/21/2013 07:51   Dg Chest Portable 1 View  08/20/2013   CLINICAL DATA:  Status post coronary bypass grafting  EXAM: PORTABLE CHEST - 1 VIEW  COMPARISON:  08/18/2013  FINDINGS: Cardiac shadow is prominent consistent with the recent surgical state. A mediastinal drain and left-sided thoracostomy catheter are noted in satisfactory position. No pneumothorax is noted.  A Swan-Ganz catheter is noted within the right pulmonary artery. The endotracheal tube is just above the aortic knob in satisfactory position. A nasogastric catheter extends into the stomach. No focal infiltrate is seen. Very minimal atelectasis is noted in the left lung base.  IMPRESSION: Tubes and lines as described.  Postsurgical changes.  Minimal left basilar atelectasis.   Electronically Signed   By: Alcide Clever M.D.   On: 08/20/2013 14:17     Assessment/Plan: S/P Procedure(s) (LRB): AORTIC VALVE REPLACEMENT (AVR) (N/A) CORONARY ARTERY BYPASS GRAFTING (CABG) (N/A) INTRAOPERATIVE TRANSESOPHAGEAL ECHOCARDIOGRAM (N/A)  CV- stable, no drips.  Maintaining  SR, BPs stable.  Continue low dose beta blocker.    Mild volume overload- diurese.  Endo- CBGs stable on SSI.  No h/o DM (A1C=5.4)  CRPI, pulm toilet.  Hopefully to stepdown today.     COLLINS,GINA H 08/22/2013 8:27 AM  I have seen and examined the patient and agree with the assessment and plan as outlined.  Doing very well.  Transfer to floor.  Anticipate d/c home 2-3 days.  Wilmina Maxham H 08/22/2013 9:06 AM

## 2013-08-23 ENCOUNTER — Inpatient Hospital Stay (HOSPITAL_COMMUNITY): Payer: BC Managed Care – PPO

## 2013-08-23 DIAGNOSIS — I359 Nonrheumatic aortic valve disorder, unspecified: Principal | ICD-10-CM

## 2013-08-23 LAB — BASIC METABOLIC PANEL
BUN: 18 mg/dL (ref 6–23)
CO2: 25 mEq/L (ref 19–32)
Chloride: 100 mEq/L (ref 96–112)
GFR calc Af Amer: >90 mL/min (ref >90)
GFR calc non Af Amer: >90 mL/min (ref >90)
Glucose, Bld: 101 mg/dL — ABNORMAL HIGH (ref 70–99)
Potassium: 3.9 mEq/L (ref 3.5–5.1)
Sodium: 136 mEq/L (ref 135–145)

## 2013-08-23 LAB — CBC
HCT: 32.4 % — ABNORMAL LOW (ref 39.0–52.0)
Hemoglobin: 11.4 g/dL — ABNORMAL LOW (ref 13.0–17.0)
MCH: 30.4 pg (ref 26.0–34.0)
MCHC: 35.2 g/dL (ref 30.0–36.0)
Platelets: 137 10*3/uL — ABNORMAL LOW (ref 150–400)
RBC: 3.75 MIL/uL — ABNORMAL LOW (ref 4.22–5.81)

## 2013-08-23 MED ORDER — METOPROLOL TARTRATE 25 MG PO TABS
25.0000 mg | ORAL_TABLET | Freq: Two times a day (BID) | ORAL | Status: DC
  Administered 2013-08-23 – 2013-08-24 (×3): 25 mg via ORAL
  Filled 2013-08-23 (×4): qty 1

## 2013-08-23 NOTE — Progress Notes (Signed)
Cardiac Rehab Phase I  Order received and appreciated.  Chart reviewed.  Pt had just returned from second walk with wife.  Reviewed pt d/c education: diet, exercise, restrictions, sternal precautions, when to call 911/MD.  Pt voiced understanding.  Pt declined Phase II rehab at this time stating that it was too far for him to travel.  Pt encouraged to continue walking with staff and and family over weekend.  We will follow up on Monday if still present. Fabio Pierce, MA, ACSM RCEP

## 2013-08-23 NOTE — Progress Notes (Addendum)
[address]       [place] [address]             [phone]      3 Days Post-Op  Procedure(s) (LRB): AORTIC VALVE REPLACEMENT (AVR) (N/A) CORONARY ARTERY BYPASS GRAFTING (CABG) (N/A) INTRAOPERATIVE TRANSESOPHAGEAL ECHOCARDIOGRAM (N/A) Subjective: Looks and feels well, mild soreness  Objective  Telemetry sinus rhythm, sinus tach  Temp:  [98.2 F (36.8 C)-98.5 F (36.9 C)] 98.5 F (36.9 C) (11/15 0527) Pulse Rate:  [72-84] 72 (11/15 0527) Resp:  [18-27] 18 (11/15 0527) BP: (99-130)/(73-88) 113/75 mmHg (11/15 0527) SpO2:  [95 %-100 %] 96 % (11/15 0527) Weight:  [203 lb 11.3 oz (92.4 kg)] 203 lb 11.3 oz (92.4 kg) (11/15 0527)   Intake/Output Summary (Last 24 hours) at 08/23/13 0730 Last data filed at 08/22/13 1318  Gross per 24 hour  Intake    690 ml  Output    350 ml  Net    340 ml       General appearance: alert, cooperative and no distress Heart: regular rate and rhythm and soft flow murmur Lungs: mildly dim in right base Abdomen: mild distension, + BS, soft, nontender Extremities: no edema Wound: incisions healing well  Lab Results:  Recent Labs  08/21/13 0355  08/21/13 1609 08/22/13 0420 08/23/13 0340  NA 140  < >  --  137 136  K 4.0  < >  --  3.8 3.9  CL 107  < >  --  102 100  CO2 23  --   --  26 25  GLUCOSE 126*  < >  --  101* 101*  BUN 14  < >  --  15 18  CREATININE 0.91  < > 0.81 0.82 0.94  CALCIUM 7.9*  --   --  8.2* 8.5  MG 2.4  --  2.2  --   --   < > = values in this interval not displayed. No results found for this basename: AST, ALT, ALKPHOS, BILITOT, PROT, ALBUMIN,  in the last 72 hours No results found for this basename: LIPASE, AMYLASE,  in the last 72 hours  Recent Labs  08/22/13 0420 08/23/13 0340  WBC 11.1* 9.8  HGB 11.5* 11.4*  HCT 32.7* 32.4*  MCV 86.7 86.4  PLT 124* 137*   No results found for this basename: CKTOTAL, CKMB, TROPONINI,  in the last 72 hours No components found with this basename:  POCBNP,  No results found for this basename: DDIMER,  in the last 72 hours No results found for this basename: HGBA1C,  in the last 72 hours No results found for this basename: CHOL, HDL, LDLCALC, TRIG, CHOLHDL,  in the last 72 hours No results found for this basename: TSH, T4TOTAL, FREET3, T3FREE, THYROIDAB,  in the last 72 hours No results found for this basename: VITAMINB12, FOLATE, FERRITIN, TIBC, IRON, RETICCTPCT,  in the last 72 hours  Medications: Scheduled . acetaminophen  1,000 mg Oral Q6H  . aspirin EC  325 mg Oral Daily  . bisacodyl  10 mg Oral Daily   Or  . bisacodyl  10 mg Rectal Daily  . docusate sodium  200 mg Oral Daily  . furosemide  40 mg Oral Daily  . metoprolol tartrate  12.5 mg Oral BID  . pantoprazole  40 mg Oral Daily  . potassium chloride  20 mEq Oral Daily  . sodium chloride  3 mL Intravenous Q12H     Radiology/Studies:  Dg Chest Port 1 View  08/22/2013   CLINICAL DATA:  Status post aortic valve replacement  EXAM: PORTABLE CHEST - 1 VIEW  COMPARISON:  08/21/2013  FINDINGS: Postsurgical changes are again seen. A right jugular central line is noted. The Swan-Ganz catheter has been removed in the interval. A mediastinal drain and thoracostomy catheter have also been removed in the interval. No recurrent pneumothorax is seen. No focal infiltrate is noted.  IMPRESSION: Interval removal of tubes and lines as described. No acute abnormality is noted.   Electronically Signed   By: Alcide Clever M.D.   On: 08/22/2013 07:27    INR: Will add last result for INR, ABG once components are confirmed Will add last 4 CBG results once components are confirmed  Assessment/Plan: S/P Procedure(s) (LRB): AORTIC VALVE REPLACEMENT (AVR) (N/A) CORONARY ARTERY BYPASS GRAFTING (CABG) (N/A) INTRAOPERATIVE TRANSESOPHAGEAL ECHOCARDIOGRAM (N/A)  1 conts to do well 2 below preop wt. , wont need lasix much longer 3 labs very stable 4 cxr- mild atx. No chf 5 increase lopressor to 25  bid- monitor  Rhythm 6 poss home 1-2 days     LOS: 3 days    GOLD,WAYNE E 11/15/20147:30 AM   Chart reviewed, patient examined, agree with above. Doing well. He can go home in the am if no changes.

## 2013-08-24 MED ORDER — BENAZEPRIL HCL 5 MG PO TABS: 5.0000 mg | ORAL_TABLET | Freq: Every day | ORAL | Status: DC

## 2013-08-24 MED ORDER — SIMVASTATIN 20 MG PO TABS
20.0000 mg | ORAL_TABLET | Freq: Every day | ORAL | Status: DC
  Filled 2013-08-24: qty 1

## 2013-08-24 MED ORDER — ASPIRIN 325 MG PO TBEC: 325.0000 mg | DELAYED_RELEASE_TABLET | Freq: Every day | ORAL | Status: DC

## 2013-08-24 MED ORDER — SIMVASTATIN 20 MG PO TABS: 20.0000 mg | ORAL_TABLET | Freq: Every day | ORAL | Status: DC

## 2013-08-24 MED ORDER — METOPROLOL TARTRATE 25 MG PO TABS: 25.0000 mg | ORAL_TABLET | Freq: Two times a day (BID) | ORAL | Status: DC

## 2013-08-24 MED ORDER — BENAZEPRIL HCL 5 MG PO TABS
5.0000 mg | ORAL_TABLET | Freq: Every day | ORAL | Status: DC
  Administered 2013-08-24: 5 mg via ORAL
  Filled 2013-08-24: qty 1

## 2013-08-24 MED ORDER — OXYCODONE HCL 5 MG PO TABS: 5.0000 mg | ORAL_TABLET | ORAL | Status: DC | PRN

## 2013-08-24 NOTE — Progress Notes (Signed)
Patient has developed an elevated temperature 99.3 after the pacing wires were pulled. Gave patient Tylenol and it has come down to 99.0. Spoke with Gershon Crane, PA regarding temperature and discomfort in chest after pacing wires were pulled. No dysrhythmias and VSS. Instructed to proceed with discharge. Reviewed discharge instructions, follow up appointments, and medications. Patient understands when to call the doctor and going home with wife and daughter. Lajuana Matte, RN

## 2013-08-24 NOTE — Discharge Summary (Signed)
[address]       [place] [address]             [phone]       Jeffery [name] 26-Mar-1956 57 y.o. [id number]  08/20/2013   Purcell Nails, MD  CAD AS  HPI: At time of consultation Patient is a 57 year old white male from [place] with known history of bicuspid aortic valve and aortic stenosis who has been referred for elective surgical consultation. The patient states that he was first noted to have a heart murmur on physical exam approximately 15 years ago when he presented with symptomatic cholelithiasis. He was diagnosed with a bicuspid aortic valve at that time and he subsequently underwent elective cholecystectomy without complication. He has been followed for the last several years by Dr. Anne Fu with serial echocardiograms. Over the last few years the patient has began to experience exertional shortness of breath and chest discomfort. A followup echocardiogram was performed 07/02/2013 that reportedly demonstrates the presence of severe aortic stenosis with peak velocity across the aortic valve measured 4.18 m/s corresponding to estimated mean transvalvular gradient of 42 millimeters mercury. Left ventricular systolic function remains normal with ejection fraction estimated 60-65%. There was mild aortic root enlargement with the sinotubular junction measured 4.2 cm. There was mild aortic regurgitation and severe calcification of the aortic valve. The severity of aortic stenosis had notably progressed significantly since the last previous echocardiogram performed in September 2013. The patient was referred for elective surgical consultation and originally seen in on 07/16/2013. Since then he underwent CT angiogram of the chest on 07/23/2013, and he underwent left and right heart catheterization earlier today by Dr. Anne Fu. He returns to the office today to discuss results of these tests. He reports no new problems or complaints.  The patient describes a gradual  onset of symptoms of exertional shortness of breath and chest tightness. He states that the symptoms occur only with relatively strenuous exertion or walking up a flight of stairs. He denies any episodes of shortness of breath or chest discomfort occurring at rest or with normal activity. He denies any nocturnal shortness of breath, orthopnea, PND, tachypalpitations, or lower extremity edema. He has had occasional mild dizzy spells without syncope. He was admitted this hospitalization for elective aortic valve replacement and coronary artery bypass grafting.   Hospital Course:  The patient was admitted electively and on 08/20/2013 taken to the operating room at which time he underwent the following procedure:  Date of Procedure: 08/20/2013  Preoperative Diagnosis:  Severe Aortic Stenosis  Severe Single-vessel Coronary Artery Disease Postoperative Diagnosis: Same  Procedure:  Aortic Valve Replacement [employer] Magna Ease Pericardial Tissue Valve (size 25mm, model # 3300TFX, serial # [id number])  Coronary Artery Bypass Grafting x 1 Left Internal Mammary Artery to Distal Left Anterior Descending Coronary Artery  Surgeon: Salvatore Decent. Cornelius Moras, MD  Assistant: Coral Ceo, PA-C  Anesthesia: [name], MD  Operative Findings:  Bicuspid aortic valve w/ severe aortic stenosis  Normal LV systolic function  Good quality LIMA conduit for grafting  Good quality target vessel for grafting The patient tolerated the procedure well and was transferred to the surgical intensive care unit in stable condition. There were no intraoperative complications.   Post operative hospital course: The patient has progressed nicely. He was extubated without difficulty. He has remained neurologically intact. All routine lines, monitors and drainage devices were discontinued in the standard fashion. He has remained hemodynamically stable. He had no significant postoperative  cardiac dysrhythmias. He did have a mild acute blood  loss anemia which has stabilized. A he is tolerating gradually increasing activities using standard protocols. Incisions are healing well evidence of infection. Oxygen was weaned and he maintains good saturations on room air. He had some mild postoperative volume overload but responded well to diuretics. Overall his status at discharge was felt to be quite stable.     Recent Labs  08/22/13 0420 08/23/13 0340  WBC 11.1* 9.8  HGB 11.5* 11.4*  HCT 32.7* 32.4*  PLT 124* 137*   No results found for this basename: INR,  in the last 72 hours   Discharge Instructions:  The patient is discharged to home with extensive instructions on wound care and progressive ambulation.  They are instructed not to drive or perform any heavy lifting until returning to see the physician in his office.  Discharge Diagnosis:  CAD AS Mild acute blood loss anemia Secondary Diagnosis: Patient Active Problem List   Diagnosis Date Noted  . S/P aortic valve replacement with bioprosthetic valve + coronary artery bypass grafting 08/20/2013  . 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 Coronary artery disease 08/01/2013  . Incidental pulmonary nodule, > 3mm and < 8mm 07/23/2013  . Aortic stenosis 07/16/2013  . Hypertension 07/16/2013  . Osteoarthritis 07/16/2013  . Hypertriglyceridemia 07/16/2013  . Bicuspid aortic valve 07/16/2013  . Chest pain on exertion 07/16/2013  . Exertional shortness of breath 07/16/2013  . Amaurosis fugax 06/17/2011   Past Medical History  Diagnosis Date  . Calculus of gallbladder   . Cholecystitis   . Osteoarthritis   . Hypertension   . Aortic stenosis   . Hypertriglyceridemia   . Bicuspid aortic valve   . Amaurosis fugax   . Chest pain on exertion   . Exertional shortness of breath   . Amaurosis fugax 2012  . Incidental pulmonary nodule, > 3mm and < 8mm 07/23/2013    5 mm L lung nodule and 4 mm R lung nodule seen incidentally on chest CT  . Coronary artery disease 08/01/2013    LAD  stenosis  . Dysrhythmia   . Heart murmur   . CHF (congestive heart failure)   . S/P aortic valve replacement with bioprosthetic valve 08/20/2013    25 mm [facility] Ease bovine pericardial tissue valve  . S/P CABG x 1 08/20/2013    LIMA to LAD        Medication List    STOP taking these medications       potassium chloride SA 20 MEQ tablet  Commonly known as:  KLOR-CON M20      TAKE these medications       aspirin 325 MG EC tablet  Take 1 tablet (325 mg total) by mouth daily.     benazepril 5 MG tablet  Commonly known as:  LOTENSIN  Take 1 tablet (5 mg total) by mouth daily.     fluticasone 50 MCG/ACT nasal spray  Commonly known as:  FLONASE  Place 2 sprays into the nose daily as needed for allergies.     hydrochlorothiazide 25 MG tablet  Commonly known as:  HYDRODIURIL  Take 25 mg by mouth daily.     metoprolol tartrate 25 MG tablet  Commonly known as:  LOPRESSOR  Take 1 tablet (25 mg total) by mouth 2 (two) times daily.     oxyCODONE 5 MG immediate release tablet  Commonly known as:  Oxy IR/ROXICODONE  Take 1-2 tablets (5-10 mg total) by mouth  every 4 (four) hours as needed for moderate pain.     simvastatin 20 MG tablet  Commonly known as:  ZOCOR  Take 1 tablet (20 mg total) by mouth daily at 6 PM.      The patient has been discharged on:   1.Beta Blocker:  Yes [   y]                              No   [   ]                              If No, reason:  2.Ace Inhibitor/ARB: Yes [  y]                                     No  [   ]                                     If No, reason:  3.Statin:   Yes [ y  ]                  No  [   ]                  If No, reason:  4.Ecasa:  Yes  Cove.Etienne   ]                  No   [   ]                  If No, reason:  Follow-up Information   Follow up with Purcell Nails, MD. (3 weeks- office will contact you. obtain chest xray 1 hour prior to appt with surgeon from [facility]. [facility] is located  in the same office complex)    Specialty:  Cardiothoracic Surgery   Contact information:   [address] [place] [address] [phone]       Follow up with Donato Schultz, MD. (call to arrange appointment for 2 weeks)    Specialty:  Cardiology   Contact information:   [address] [place] [address] [phone]       Disposition: Discharged home  Patient's condition is Jeffery [name], [place] 08/24/2013  11:30 AM

## 2013-08-24 NOTE — Progress Notes (Signed)
Removed EPW and CT sutures per MD order per hospital policy. Pacing wires intact upon removal. VSS BP - 130/88, HR - 79, slight bleeding on the rt pacing wire site, applied gauze. Applied benzoin and steri strips to CT suture sites. Patient complained that it was "more painful than the CT removal". Reminded patient to remain in bed for 1 hour. Will continue to monitor closely. Lajuana Matte, RN

## 2013-08-24 NOTE — Progress Notes (Addendum)
[address]       [place] [address]             [phone]      4 Days Post-Op  Procedure(s) (LRB): AORTIC VALVE REPLACEMENT (AVR) (N/A) CORONARY ARTERY BYPASS GRAFTING (CABG) (N/A) INTRAOPERATIVE TRANSESOPHAGEAL ECHOCARDIOGRAM (N/A) Subjective: Looks and feels well  Objective  Telemetry sinus rhythm  Temp:  [98.5 F (36.9 C)-98.9 F (37.2 C)] 98.7 F (37.1 C) (11/16 0615) Pulse Rate:  [70-86] 78 (11/16 0615) Resp:  [17] 17 (11/16 0615) BP: (107-137)/(60-89) 137/89 mmHg (11/16 0615) SpO2:  [94 %-97 %] 97 % (11/16 0615) Weight:  [200 lb 6.4 oz (90.9 kg)] 200 lb 6.4 oz (90.9 kg) (11/16 0615)   Intake/Output Summary (Last 24 hours) at 08/24/13 0731 Last data filed at 08/24/13 0600  Gross per 24 hour  Intake    240 ml  Output   1100 ml  Net   -860 ml       General appearance: alert, cooperative and no distress Heart: regular rate and rhythm Lungs: clear to auscultation bilaterally Abdomen: benign Extremities: trace edema Wound: incis healing well  Lab Results:  Recent Labs  08/21/13 1609 08/22/13 0420 08/23/13 0340  NA  --  137 136  K  --  3.8 3.9  CL  --  102 100  CO2  --  26 25  GLUCOSE  --  101* 101*  BUN  --  15 18  CREATININE 0.81 0.82 0.94  CALCIUM  --  8.2* 8.5  MG 2.2  --   --    No results found for this basename: AST, ALT, ALKPHOS, BILITOT, PROT, ALBUMIN,  in the last 72 hours No results found for this basename: LIPASE, AMYLASE,  in the last 72 hours  Recent Labs  08/22/13 0420 08/23/13 0340  WBC 11.1* 9.8  HGB 11.5* 11.4*  HCT 32.7* 32.4*  MCV 86.7 86.4  PLT 124* 137*   No results found for this basename: CKTOTAL, CKMB, TROPONINI,  in the last 72 hours No components found with this basename: POCBNP,  No results found for this basename: DDIMER,  in the last 72 hours No results found for this basename: HGBA1C,  in the last 72 hours No results found for this basename: CHOL, HDL, LDLCALC, TRIG, CHOLHDL,  in  the last 72 hours No results found for this basename: TSH, T4TOTAL, FREET3, T3FREE, THYROIDAB,  in the last 72 hours No results found for this basename: VITAMINB12, FOLATE, FERRITIN, TIBC, IRON, RETICCTPCT,  in the last 72 hours  Medications: Scheduled . acetaminophen  1,000 mg Oral Q6H  . aspirin EC  325 mg Oral Daily  . bisacodyl  10 mg Oral Daily   Or  . bisacodyl  10 mg Rectal Daily  . docusate sodium  200 mg Oral Daily  . furosemide  40 mg Oral Daily  . metoprolol tartrate  25 mg Oral BID  . pantoprazole  40 mg Oral Daily  . potassium chloride  20 mEq Oral Daily  . sodium chloride  3 mL Intravenous Q12H     Radiology/Studies:  Dg Chest 2 View  08/23/2013   CLINICAL DATA:  Followup atelectasis.  EXAM: CHEST  2 VIEW  COMPARISON:  One-view chest 08/22/2013.  CTA chest 07/23/2013.  FINDINGS: The heart is mildly enlarged. Mild pulmonary vascular congestion is evident. Small bilateral pleural effusions are present. Mild associated atelectasis is present. Note is made of an aortic valve replacement. The upper  lung fields are clear. The visualized soft tissues and bony thorax are unremarkable.  IMPRESSION: 1. Status post median sternotomy for aortic valve replacement. 2. Borderline cardiomegaly with small bilateral pleural effusions and associated atelectasis.   Electronically Signed   By: Gennette Pac M.D.   On: 08/23/2013 07:50    INR: Will add last result for INR, ABG once components are confirmed Will add last 4 CBG results once components are confirmed  Assessment/Plan: S/P Procedure(s) (LRB): AORTIC VALVE REPLACEMENT (AVR) (N/A) CORONARY ARTERY BYPASS GRAFTING (CABG) (N/A) INTRAOPERATIVE TRANSESOPHAGEAL ECHOCARDIOGRAM (N/A)  1 conts to do well 2 d/c epw's this am, if no new issues d/c home later today 3 will add low dose ace inhib and statin   LOS: 4 days    GOLD,WAYNE E 11/16/20147:31 AM

## 2013-08-25 ENCOUNTER — Encounter (HOSPITAL_COMMUNITY): Payer: Self-pay | Admitting: Thoracic Surgery (Cardiothoracic Vascular Surgery)

## 2013-08-30 ENCOUNTER — Encounter: Payer: Self-pay | Admitting: Cardiology

## 2013-09-08 ENCOUNTER — Encounter: Payer: Self-pay | Admitting: Cardiology

## 2013-09-08 ENCOUNTER — Ambulatory Visit (INDEPENDENT_AMBULATORY_CARE_PROVIDER_SITE_OTHER): Payer: BC Managed Care – PPO | Admitting: Cardiology

## 2013-09-08 VITALS — BP 112/80 | HR 68 | Ht 70.0 in | Wt 196.1 lb

## 2013-09-08 DIAGNOSIS — Z953 Presence of xenogenic heart valve: Secondary | ICD-10-CM

## 2013-09-08 DIAGNOSIS — I2510 Atherosclerotic heart disease of native coronary artery without angina pectoris: Secondary | ICD-10-CM

## 2013-09-08 DIAGNOSIS — I10 Essential (primary) hypertension: Secondary | ICD-10-CM

## 2013-09-08 DIAGNOSIS — Z952 Presence of prosthetic heart valve: Secondary | ICD-10-CM

## 2013-09-08 DIAGNOSIS — E781 Pure hyperglyceridemia: Secondary | ICD-10-CM

## 2013-09-08 NOTE — Progress Notes (Signed)
[address] [place], [place]  [address] Phone: [phone] Fax:  [phone]  Date:  09/08/2013   ID:  Jeffery, [name] 1956/08/13, MRN [id number]  PCP:  Lenora Boys, MD   History of Present Illness: Jeffery [name] is a 57 y.o. male status post aortic valve replacement with bioprosthetic valve 08/20/13 with single vessel bypass LIMA to LAD here for followup.  Approximately 15 years ago he was diagnosed with bicuspid valve it is been followed for several years. He began to experience chest discomfort, shortness of breath. Sinotubular junction 4.2 cm.  Has felt some mild numbness in his right fingertips. Good blood flow. Had questions answered. Overall he is doing well. Occasional chest wall pain with sneezing. Using pillow.   Wt Readings from Last 3 Encounters:  09/08/13 196 lb 1.9 oz (88.959 kg)  08/24/13 200 lb 6.4 oz (90.9 kg)  08/24/13 200 lb 6.4 oz (90.9 kg)     Past Medical History  Diagnosis Date  . Calculus of gallbladder   . Cholecystitis   . Osteoarthritis   . Hypertension   . Aortic stenosis   . Hypertriglyceridemia   . Bicuspid aortic valve   . Amaurosis fugax   . Chest pain on exertion   . Exertional shortness of breath   . Amaurosis fugax 2012  . Incidental pulmonary nodule, > 3mm and < 8mm 07/23/2013    5 mm L lung nodule and 4 mm R lung nodule seen incidentally on chest CT  . Coronary artery disease 08/01/2013    LAD stenosis  . Dysrhythmia   . Heart murmur   . CHF (congestive heart failure)   . S/P aortic valve replacement with bioprosthetic valve 08/20/2013    25 mm [facility] Ease bovine pericardial tissue valve  . S/P CABG x 1 08/20/2013    LIMA to LAD    Past Surgical History  Procedure Laterality Date  . Umbillical hernia    . Cholecystectomy    . Nose surgery    . Aortic valve replacement N/A 08/20/2013    Procedure: AORTIC VALVE REPLACEMENT (AVR);  Surgeon: Purcell Nails, MD;  Location: [facility] OR;   Service: Open Heart Surgery;  Laterality: N/A;  . Coronary artery bypass graft N/A 08/20/2013    Procedure: CORONARY ARTERY BYPASS GRAFTING (CABG);  Surgeon: Purcell Nails, MD;  Location: [facility] OR;  Service: Open Heart Surgery;  Laterality: N/A;  CABG x one, using left internal mammary artery  . Intraoperative transesophageal echocardiogram N/A 08/20/2013    Procedure: INTRAOPERATIVE TRANSESOPHAGEAL ECHOCARDIOGRAM;  Surgeon: Purcell Nails, MD;  Location: [facility] OR;  Service: Open Heart Surgery;  Laterality: N/A;    Current Outpatient Prescriptions  Medication Sig Dispense Refill  . aspirin EC 325 MG EC tablet Take 1 tablet (325 mg total) by mouth daily.      . benazepril (LOTENSIN) 5 MG tablet Take 1 tablet (5 mg total) by mouth daily.  30 tablet  1  . cetirizine (ZYRTEC) 10 MG tablet Take 10 mg by mouth daily.      . fluticasone (FLONASE) 50 MCG/ACT nasal spray Place 2 sprays into the nose daily as needed for allergies.       . hydrochlorothiazide (HYDRODIURIL) 25 MG tablet Take 25 mg by mouth daily.      . metoprolol tartrate (LOPRESSOR) 25 MG tablet Take 1 tablet (25 mg total) by mouth 2 (two) times daily.  60 tablet  1  .  oxyCODONE (OXY IR/ROXICODONE) 5 MG immediate release tablet Take 1-2 tablets (5-10 mg total) by mouth every 4 (four) hours as needed for moderate pain.  50 tablet  0  . simvastatin (ZOCOR) 20 MG tablet Take 1 tablet (20 mg total) by mouth daily at 6 PM.  30 tablet  1   No current facility-administered medications for this visit.    Allergies:   No Known Allergies  Social History:  The patient  reports that he quit smoking about 20 years ago. He has quit using smokeless tobacco. He reports that he drinks about 0.6 ounces of alcohol per week. He reports that he does not use illicit drugs.   ROS:  Please see the history of present illness.   Denies any bleeding, syncope, orthopnea, PND.   PHYSICAL EXAM: VS:  BP 112/80  Pulse 68  Ht 5\' 10"  (1.778 m)  Wt 196 lb 1.9 oz  (88.959 kg)  BMI 28.14 kg/m2 Well nourished, well developed, in no acute distress HEENT: normal Neck: no JVD Cardiac:  normal S1, S2; RRR; no murmur Lungs:  clear to auscultation bilaterally, no wheezing, rhonchi or rales Abd: soft, nontender, no hepatomegaly Ext: no edemaRadial artery intact. Normal capillary refill bilaterally. Normal warmth and digits bilaterally. Normal movement. Skin: warm and dryScar well healing. Neuro: no focal abnormalities noted    ASSESSMENT AND PLAN:  1. Status post aortic valve replacement-overall doing well. Dr. Cornelius Moras. Prior bicuspid valve. Overall doing well. Progressing well. Continue with driving restrictions.  2. Coronary artery disease-LIMA to LAD. No anginal symptoms. 3. Hypertriglyceridemia-continue with diet, exercise, simvastatin 4. Hypertension-under good control. I will his hydrochlorothiazide in half. I would like him to continue his metoprolol perioperatively.  Signed, Donato Schultz, MD [facility]  09/08/2013 11:10 AM

## 2013-09-08 NOTE — Patient Instructions (Addendum)
Your physician recommends that you schedule a follow-up appointment in: 2  MONTHS WITH DR Anne Fu  Your physician has recommended you make the following change in your medication: DECREASE  HYDROCHLOROTHIAZIDE   TO  1/2   TAB  DAILY   12.5 MG

## 2013-09-10 ENCOUNTER — Other Ambulatory Visit: Payer: Self-pay | Admitting: *Deleted

## 2013-09-10 DIAGNOSIS — I2510 Atherosclerotic heart disease of native coronary artery without angina pectoris: Secondary | ICD-10-CM

## 2013-09-15 ENCOUNTER — Ambulatory Visit
Admission: RE | Admit: 2013-09-15 | Discharge: 2013-09-15 | Disposition: A | Discharge status: Home / Self Care | Payer: BC Managed Care – PPO | Source: Ambulatory Visit | Attending: Thoracic Surgery (Cardiothoracic Vascular Surgery) | Admitting: Thoracic Surgery (Cardiothoracic Vascular Surgery)

## 2013-09-15 ENCOUNTER — Ambulatory Visit (INDEPENDENT_AMBULATORY_CARE_PROVIDER_SITE_OTHER): Payer: Self-pay | Admitting: Surgical

## 2013-09-15 VITALS — BP 107/80 | HR 83 | Resp 20 | Ht 70.0 in | Wt 196.0 lb

## 2013-09-15 DIAGNOSIS — I350 Nonrheumatic aortic (valve) stenosis: Secondary | ICD-10-CM

## 2013-09-15 DIAGNOSIS — I2510 Atherosclerotic heart disease of native coronary artery without angina pectoris: Secondary | ICD-10-CM

## 2013-09-15 DIAGNOSIS — I359 Nonrheumatic aortic valve disorder, unspecified: Secondary | ICD-10-CM

## 2013-09-15 DIAGNOSIS — Z952 Presence of prosthetic heart valve: Secondary | ICD-10-CM

## 2013-09-15 DIAGNOSIS — Z951 Presence of aortocoronary bypass graft: Secondary | ICD-10-CM

## 2013-09-15 DIAGNOSIS — Z954 Presence of other heart-valve replacement: Secondary | ICD-10-CM

## 2013-09-15 NOTE — Patient Instructions (Signed)
The patient received a verbal instructions regarding activity progression and driving protocol.

## 2013-09-15 NOTE — Progress Notes (Signed)
[address]       [place] [address]             [phone]                  [name] Medical Record #[id number] Date of Birth: [birth year]  Jeffery Schultz, MD Lenora Boys, MD  Chief Complaint:   PostOp Follow Up Visit   History of Present Illness:    Patient is seen in routine followup today following his aortic valve replacement and coronary bypass grafting x1 on 08/20/2013 by Dr. Cornelius Moras. Overall he reports that he is doing well, progressing daily and his recovery. He has no specific issues at this time. He denies fevers, chills, or other constitutional symptoms. He's not having shortness of breath. He denies palpitations or other cardiac symptoms. He has only mild soreness from his chest incision.           History  Smoking status  . Former Smoker  . Quit date: 10/09/1992  Smokeless tobacco  . Former Neurosurgeon    Comment: chewed on cigars       No Known Allergies  Current Outpatient Prescriptions  Medication Sig Dispense Refill  . aspirin EC 325 MG EC tablet Take 1 tablet (325 mg total) by mouth daily.      . benazepril (LOTENSIN) 5 MG tablet Take 1 tablet (5 mg total) by mouth daily.  30 tablet  1  . cetirizine (ZYRTEC) 10 MG tablet Take 10 mg by mouth daily.      . fluticasone (FLONASE) 50 MCG/ACT nasal spray Place 2 sprays into the nose daily as needed for allergies.       . hydrochlorothiazide (HYDRODIURIL) 25 MG tablet Take 25 mg by mouth daily. 1/2 tablet per day      . metoprolol tartrate (LOPRESSOR) 25 MG tablet Take 1 tablet (25 mg total) by mouth 2 (two) times daily.  60 tablet  1  . oxyCODONE (OXY IR/ROXICODONE) 5 MG immediate release tablet Take 1-2 tablets (5-10 mg total) by mouth every 4 (four) hours as needed for moderate pain.  50 tablet  0  . simvastatin (ZOCOR) 20 MG tablet Take 1 tablet (20 mg total) by mouth daily at 6 PM.  30 tablet  1   No current facility-administered medications for this visit.        Physical Exam: BP 107/80  Pulse 83  Resp 20  Ht 5\' 10"  (1.778 m)  Wt 196 lb (88.905 kg)  BMI 28.12 kg/m2  SpO2 98%  General appearance: alert, cooperative and no distress Heart: regularly irregular rhythm and Soft systolic flow murmur Lungs: clear to auscultation bilaterally Extremities: extremities normal, atraumatic, no cyanosis or edema Wound: Incisions healing well without evidence of infection  Diagnostic Studies & Laboratory data:         Recent Radiology Findings: Dg Chest 2 View  09/15/2013   CLINICAL DATA:  Cardiac surgery in November, 2014. coronary artery disease. Aortic valve replacement. CABG.  EXAM: CHEST  2 VIEW  COMPARISON:  08/23/2013  FINDINGS: Resolved atelectasis and pleural effusion. Heart size within normal limits. No edema. The lungs appear clear.  IMPRESSION: 1. Resolved atelectasis and pleural effusion. Heart size within normal limits and lungs appear clear.   Electronically Signed   By: Herbie Baltimore M.D.   On: 09/15/2013 13:18      Recent Labs: Lab Results  Component Value Date   WBC 9.8 08/23/2013  HGB 11.4* 08/23/2013   HCT 32.4* 08/23/2013   PLT 137* 08/23/2013   GLUCOSE 101* 08/23/2013   ALT 34 08/18/2013   AST 28 08/18/2013   NA 136 08/23/2013   K 3.9 08/23/2013   CL 100 08/23/2013   CREATININE 0.94 08/23/2013   BUN 18 08/23/2013   CO2 25 08/23/2013   INR 1.47 08/20/2013   HGBA1C 5.4 08/18/2013      Assessment / Plan:  The patient is doing well. We discussed activity progression and driving instructions. He is anxious to return to work and I told him he could start light-duty type activities and another 3-4 weeks. 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 Preoperatively, he did have some nodules on his CT scan so I have him followup in 6 months with Dr. Cornelius Moras. He has seen Dr. Anne Fu his cardiologist who will continue to follow for his long-term cardiology needs.          GOLD,WAYNE E 09/15/2013 2:06 PM

## 2013-10-03 ENCOUNTER — Telehealth: Payer: Self-pay | Admitting: Cardiology

## 2013-10-03 NOTE — Telephone Encounter (Signed)
Wife called stating they are going on vacation tomorrow and wants to know if he can get in hot tube.  Had AVR and CABG x 1 on 11/12.  Advised that he should not get in hot tube.  Also no lifting or pulling over 20 lbs.  She verbalizes understanding and will comply.

## 2013-10-03 NOTE — Telephone Encounter (Signed)
New problem           Pt had open heart 11/12 and is going on vacation 12/27, wife called to find out if there were any resritions they should be aware of. For example Hot tube's can he go in.  Please give them a call back.

## 2013-10-19 ENCOUNTER — Other Ambulatory Visit: Payer: Self-pay | Admitting: Surgical

## 2013-10-22 ENCOUNTER — Other Ambulatory Visit: Payer: Self-pay | Admitting: Cardiology

## 2013-11-11 ENCOUNTER — Ambulatory Visit (INDEPENDENT_AMBULATORY_CARE_PROVIDER_SITE_OTHER): Payer: BC Managed Care – PPO | Admitting: Cardiology

## 2013-11-11 ENCOUNTER — Encounter: Payer: Self-pay | Admitting: Cardiology

## 2013-11-11 VITALS — BP 120/84 | HR 60 | Ht 70.0 in | Wt 207.8 lb

## 2013-11-11 DIAGNOSIS — I350 Nonrheumatic aortic (valve) stenosis: Secondary | ICD-10-CM

## 2013-11-11 DIAGNOSIS — E781 Pure hyperglyceridemia: Secondary | ICD-10-CM

## 2013-11-11 DIAGNOSIS — Z953 Presence of xenogenic heart valve: Secondary | ICD-10-CM

## 2013-11-11 DIAGNOSIS — Z951 Presence of aortocoronary bypass graft: Secondary | ICD-10-CM

## 2013-11-11 DIAGNOSIS — I359 Nonrheumatic aortic valve disorder, unspecified: Secondary | ICD-10-CM

## 2013-11-11 DIAGNOSIS — I2510 Atherosclerotic heart disease of native coronary artery without angina pectoris: Secondary | ICD-10-CM

## 2013-11-11 DIAGNOSIS — Z952 Presence of prosthetic heart valve: Secondary | ICD-10-CM

## 2013-11-11 NOTE — Progress Notes (Signed)
[address] [place], [place]  [id number] Phone: [phone] Fax:  [phone]  Date:  11/11/2013   ID:  Jeffery [name], DOB 1955/12/27, MRN [id number]  PCP:  Lenora BoysFRIED, ROBERT L, MD   History of Present Illness: Jeffery [name] is a 58 y.o. male status post aortic valve replacement with bioprosthetic valve 08/20/13 with single vessel bypass LIMA to LAD here for followup.  Approximately 15 years ago he was diagnosed with bicuspid valve it is been followed for several years. He began to experience chest discomfort, shortness of breath. Sinotubular junction 4.2 cm.  Overall he's been doing quite well, mild chest numbness. He is back to work. Understands lifting restrictions.  Wt Readings from Last 3 Encounters:  11/11/13 207 lb 12.8 oz (94.257 kg)  09/15/13 196 lb (88.905 kg)  09/08/13 196 lb 1.9 oz (88.959 kg)     Past Medical History  Diagnosis Date  . Calculus of gallbladder   . Cholecystitis   . Osteoarthritis   . Hypertension   . Aortic stenosis   . Hypertriglyceridemia   . Bicuspid aortic valve   . Amaurosis fugax   . Chest pain on exertion   . Exertional shortness of breath   . Amaurosis fugax 2012  . Incidental pulmonary nodule, > 3mm and < 8mm 07/23/2013    5 mm L lung nodule and 4 mm R lung nodule seen incidentally on chest CT  . Coronary artery disease 08/01/2013    LAD stenosis  . Dysrhythmia   . Heart murmur   . CHF (congestive heart failure)   . S/P aortic valve replacement with bioprosthetic valve 08/20/2013    25 mm [facility] Ease bovine pericardial tissue valve  . S/P CABG x 1 08/20/2013    LIMA to LAD    Past Surgical History  Procedure Laterality Date  . Umbillical hernia    . Cholecystectomy    . Nose surgery    . Aortic valve replacement N/A 08/20/2013    Procedure: AORTIC VALVE REPLACEMENT (AVR);  Surgeon: Purcell Nailslarence H [name], MD;  [facility];  Service: Open Heart Surgery;  Laterality: N/A;  . Coronary artery bypass  graft N/A 08/20/2013    Procedure: CORONARY ARTERY BYPASS GRAFTING (CABG);  Surgeon: Purcell Nailslarence H [name], MD;  [facility];  Service: Open Heart Surgery;  Laterality: N/A;  CABG x one, using left internal mammary artery  . Intraoperative transesophageal echocardiogram N/A 08/20/2013    Procedure: INTRAOPERATIVE TRANSESOPHAGEAL ECHOCARDIOGRAM;  Surgeon: Purcell Nailslarence H [name], MD;  [facility];  Service: Open Heart Surgery;  Laterality: N/A;    Current Outpatient Prescriptions  Medication Sig Dispense Refill  . aspirin EC 325 MG EC tablet Take 1 tablet (325 mg total) by mouth daily.      . benazepril (LOTENSIN) 5 MG tablet TAKE 1 TABLET BY MOUTH EVERY DAY  30 tablet  0  . fluticasone (FLONASE) 50 MCG/ACT nasal spray Place 2 sprays into the nose daily as needed for allergies.       . hydrochlorothiazide (HYDRODIURIL) 25 MG tablet Take 25 mg by mouth daily. 1/2 tablet per day      . metoprolol tartrate (LOPRESSOR) 25 MG tablet TAKE 1 TABLET BY MOUTH TWICE A DAY  60 tablet  0  . simvastatin (ZOCOR) 20 MG tablet TAKE 1 TABLET BY MOUTH EVERY DAY AT 6 PM  30 tablet  0   No current facility-administered medications for this visit.  Allergies:   No Known Allergies  Social History:  The patient  reports that he quit smoking about 21 years ago. He has quit using smokeless tobacco. He reports that he drinks about 0.6 ounces of alcohol per week. He reports that he does not use illicit drugs.   ROS:  Please see the history of present illness.   Denies any bleeding, syncope, orthopnea, PND.   PHYSICAL EXAM: VS:  BP 120/84  Pulse 60  Ht 5\' 10"  (1.778 m)  Wt 207 lb 12.8 oz (94.257 kg)  BMI 29.82 kg/m2 Well nourished, well developed, in no acute distress HEENT: normal Neck: no JVD Cardiac:  normal S1, S2; RRR; 1/6 SM RUSB, scar noted.  Lungs:  clear to auscultation bilaterally, no wheezing, rhonchi or rales Abd: soft, nontender, no hepatomegaly Ext: no edemaRadial artery intact. Normal capillary  refill bilaterally. Normal warmth and digits bilaterally. Normal movement. Skin: warm and dryScar well healing. Neuro: no focal abnormalities noted    ASSESSMENT AND PLAN:  1. Status post aortic valve replacement-overall doing well. Dr. Cornelius Moras. Prior bicuspid valve. Overall doing well. Progressing well. Continue with driving restrictions.  2. Coronary artery disease-LIMA to LAD. No anginal symptoms. 3. Hypertriglyceridemia-continue with diet, exercise, simvastatin 4. Hypertension-under good control. No changes in medication.  5. 6 month followup  Signed, Donato Schultz, MD [facility]  11/11/2013 9:35 AM

## 2013-11-11 NOTE — Patient Instructions (Signed)
Your physician recommends that you continue on your current medications as directed. Please refer to the Current Medication list given to you today.  Your physician wants you to follow-up in: 6 months with Dr. Skains. You will receive a reminder letter in the mail two months in advance. If you don't receive a letter, please call our office to schedule the follow-up appointment.  

## 2013-11-22 ENCOUNTER — Other Ambulatory Visit: Payer: Self-pay | Admitting: Cardiology

## 2013-12-18 ENCOUNTER — Telehealth: Payer: Self-pay | Admitting: Cardiology

## 2013-12-18 NOTE — Telephone Encounter (Signed)
Wife, [name], calls today b/c pt has been having pain around the ribs below the arms. States Sunday while lifting an object weighing 5 lbs or less pt let out a yell b/c it was sore around the rib area.  Denies any angina, no fever, no weight gain, no cough. Denies any pain on inspiration.  States his rib area is sore to the touch. Tylenol does not relieve it & pt was wondering if it was his Zocor.  In light of the above symptoms I have asked [name] to call Dr. Cornelius Moraswen office tomorrow. I have also made pt an appointment with Dr. Anne FuSkains for Tuesday December 23, 2013   Mylo Redebbie Gable Odonohue RN

## 2013-12-18 NOTE — Telephone Encounter (Signed)
Pt having pain left side of chest toward rib cage, more  soreness, for a couple weeks , pls advise [phone]

## 2013-12-19 ENCOUNTER — Telehealth: Payer: Self-pay | Admitting: *Deleted

## 2013-12-19 NOTE — Telephone Encounter (Signed)
Received a call from his wife that he has been having soreness in his ribs on the left side after lifting something that weighed less than 5# last Sunday.  He thought maybe it was due to his ZOCOR and has an appointment to see cardiology next week.  I suggested he see his PCP in the meantime to assess this pain with a possible chest xray.  He is s/p CABG/AVR 11/14.  She agreed.

## 2013-12-19 NOTE — Telephone Encounter (Signed)
Wife calls b/c pt is still having rib pain now right in the center of his chest. He is seeing his pcp now & they will be doing a cxr. "he just doesn't complain about pain" He will keep his appt with Dr. Anne FuSkains for Tuesday. Mylo Redebbie Warnell Rasnic RN

## 2013-12-22 NOTE — Telephone Encounter (Signed)
Will be seeing tomorrow. Continued chest pain/rib pain. Postoperative period.

## 2013-12-22 NOTE — Telephone Encounter (Signed)
Agree with above plan. Appointment made.

## 2013-12-23 ENCOUNTER — Ambulatory Visit: Payer: BC Managed Care – PPO | Admitting: Cardiology

## 2014-03-16 ENCOUNTER — Ambulatory Visit (INDEPENDENT_AMBULATORY_CARE_PROVIDER_SITE_OTHER): Payer: BC Managed Care – PPO | Admitting: Thoracic Surgery (Cardiothoracic Vascular Surgery)

## 2014-03-16 ENCOUNTER — Encounter: Payer: Self-pay | Admitting: Thoracic Surgery (Cardiothoracic Vascular Surgery)

## 2014-03-16 VITALS — BP 128/80 | HR 92 | Resp 20 | Ht 70.0 in | Wt 207.0 lb

## 2014-03-16 DIAGNOSIS — Z954 Presence of other heart-valve replacement: Secondary | ICD-10-CM

## 2014-03-16 DIAGNOSIS — I2510 Atherosclerotic heart disease of native coronary artery without angina pectoris: Secondary | ICD-10-CM

## 2014-03-16 DIAGNOSIS — R911 Solitary pulmonary nodule: Secondary | ICD-10-CM

## 2014-03-16 DIAGNOSIS — Z951 Presence of aortocoronary bypass graft: Secondary | ICD-10-CM

## 2014-03-16 DIAGNOSIS — Z952 Presence of prosthetic heart valve: Secondary | ICD-10-CM

## 2014-03-16 DIAGNOSIS — I359 Nonrheumatic aortic valve disorder, unspecified: Secondary | ICD-10-CM

## 2014-03-16 DIAGNOSIS — Z953 Presence of xenogenic heart valve: Secondary | ICD-10-CM

## 2014-03-16 NOTE — Progress Notes (Signed)
[address]       [place] [address]             [phone]     CARDIOTHORACIC SURGERY OFFICE NOTE  Referring Provider is Donato Schultz, MD PCP is Lenora Boys, MD   HPI:  Patient returns for routine followup status post aortic valve replacement using a bioprosthetic tissue valve and single vessel coronary artery bypass grafting on 08/20/2013 for bicuspid aortic valve disease with severe symptomatic aortic stenosis and single-vessel coronary artery disease. His postoperative recovery was uncomplicated And he was last seen here in our office on 09/15/2013. Since then he has been seen in followup by Dr. Anne Fu who continues to see him every 6 months. The patient states that he went back to work 6 weeks after surgery and he has continued to do quite well. He reports his exercise tolerance is back to baseline. He denies any significant exertional shortness of breath or chest discomfort. He reports no physical limitations whatsoever.   Current Outpatient Prescriptions  Medication Sig Dispense Refill  . aspirin EC 325 MG EC tablet Take 1 tablet (325 mg total) by mouth daily.      . benazepril (LOTENSIN) 5 MG tablet TAKE 1 TABLET BY MOUTH EVERY DAY  30 tablet  9  . fluticasone (FLONASE) 50 MCG/ACT nasal spray Place 2 sprays into the nose daily as needed for allergies.       . hydrochlorothiazide (HYDRODIURIL) 25 MG tablet Take 25 mg by mouth daily. 1/2 tablet per day      . metoprolol tartrate (LOPRESSOR) 25 MG tablet TAKE 1 TABLET BY MOUTH TWICE A DAY  60 tablet  9  . Omega-3 Fatty Acids (FISH OIL) 500 MG CAPS Take by mouth daily.      . simvastatin (ZOCOR) 20 MG tablet TAKE 1 TABLET BY MOUTH EVERY DAY AT 6 PM  30 tablet  9   No current facility-administered medications for this visit.      Physical Exam:   BP 128/80  Pulse 92  Resp 20  Ht 5\' 10"  (1.778 m)  Wt 207 lb (93.895 kg)  BMI 29.70 kg/m2  SpO2 96%  General:  Well-appearing  Chest:   Clear to  auscultation  CV:   Regular rate and rhythm with soft systolic murmur heard best at right sternal border  Incisions:  Completely healed, sternum is stable  Abdomen:  Soft and nontender  Extremities:  Warm and well-perfused  Diagnostic Tests:  n/a   Impression:  Patient is doing well 6 months status post aortic valve replacement using a bioprosthetic tissue valve with single-vessel coronary artery bypass grafting. He has a remote history of tobacco abuse and preoperative CT scan of the chest revealed a couple of small pulmonary nodules that require followup.    Plan:  We will obtain followup CT scan of the chest to followup the pulmonary nodules seen previously. We have not made any changes to the patient's current medications. He has been reminded regarding the need for lifelong antibiotic prophylaxis for all dental cleaning and related procedures. He will return in 6 months for routine followup. At some point he should have a routine followup echocardiogram performed.  I spent in excess of 15 minutes during the conduct of this office consultation and >50% of this time involved direct face-to-face encounter with the patient for counseling and/or coordination of their care.    Salvatore Decent. Cornelius Moras, MD 03/16/2014 2:02 PM

## 2014-03-16 NOTE — Patient Instructions (Signed)

## 2014-03-17 ENCOUNTER — Other Ambulatory Visit: Payer: Self-pay | Admitting: *Deleted

## 2014-03-17 DIAGNOSIS — R918 Other nonspecific abnormal finding of lung field: Secondary | ICD-10-CM

## 2014-03-19 ENCOUNTER — Other Ambulatory Visit: Payer: BC Managed Care – PPO

## 2014-03-20 ENCOUNTER — Ambulatory Visit
Admission: RE | Admit: 2014-03-20 | Discharge: 2014-03-20 | Disposition: A | Discharge status: Home / Self Care | Payer: BC Managed Care – PPO | Source: Ambulatory Visit | Attending: Thoracic Surgery (Cardiothoracic Vascular Surgery) | Admitting: Thoracic Surgery (Cardiothoracic Vascular Surgery)

## 2014-03-20 DIAGNOSIS — R918 Other nonspecific abnormal finding of lung field: Secondary | ICD-10-CM

## 2014-03-20 MED ORDER — IOHEXOL 300 MG/ML  SOLN
75.0000 mL | Freq: Once | INTRAMUSCULAR | Status: AC | PRN
  Administered 2014-03-20: 75 mL via INTRAVENOUS

## 2014-05-07 ENCOUNTER — Other Ambulatory Visit: Payer: Self-pay | Admitting: Surgical

## 2014-08-17 ENCOUNTER — Other Ambulatory Visit: Payer: Self-pay | Admitting: *Deleted

## 2014-08-17 DIAGNOSIS — R918 Other nonspecific abnormal finding of lung field: Secondary | ICD-10-CM

## 2014-09-17 ENCOUNTER — Encounter (HOSPITAL_COMMUNITY): Payer: Self-pay | Admitting: Cardiology

## 2014-09-21 ENCOUNTER — Ambulatory Visit: Payer: BC Managed Care – PPO | Admitting: Thoracic Surgery (Cardiothoracic Vascular Surgery)

## 2014-09-26 ENCOUNTER — Other Ambulatory Visit: Payer: Self-pay | Admitting: Cardiology

## 2014-09-26 IMAGING — CR DG CHEST 2V
2 series · 2 of 2 positions shown · non-contrast
Comparison: Chest CT, 07/23/2013

CLINICAL DATA: Pre-admission for CABG and aortic valve replacement.
Chest tightness and hypertension.

EXAM:
CHEST  2 VIEW

[w chest pa]
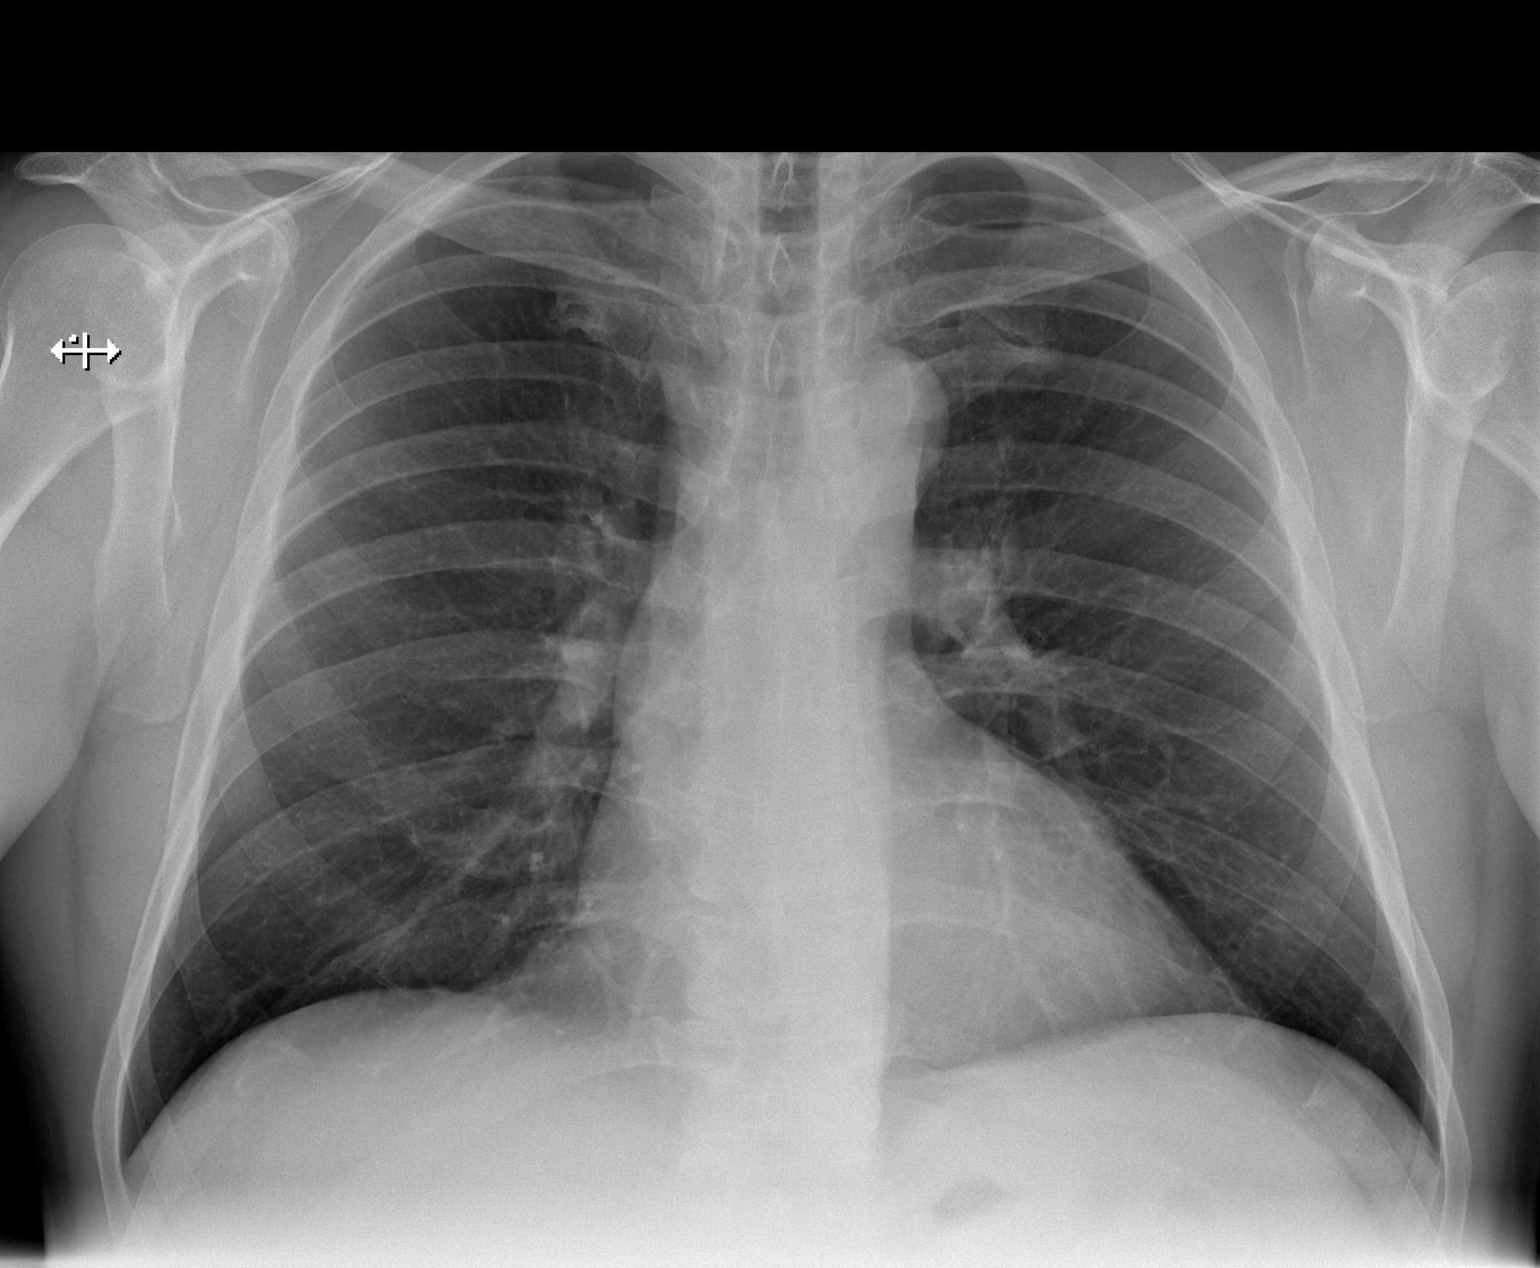

[w chest lat]
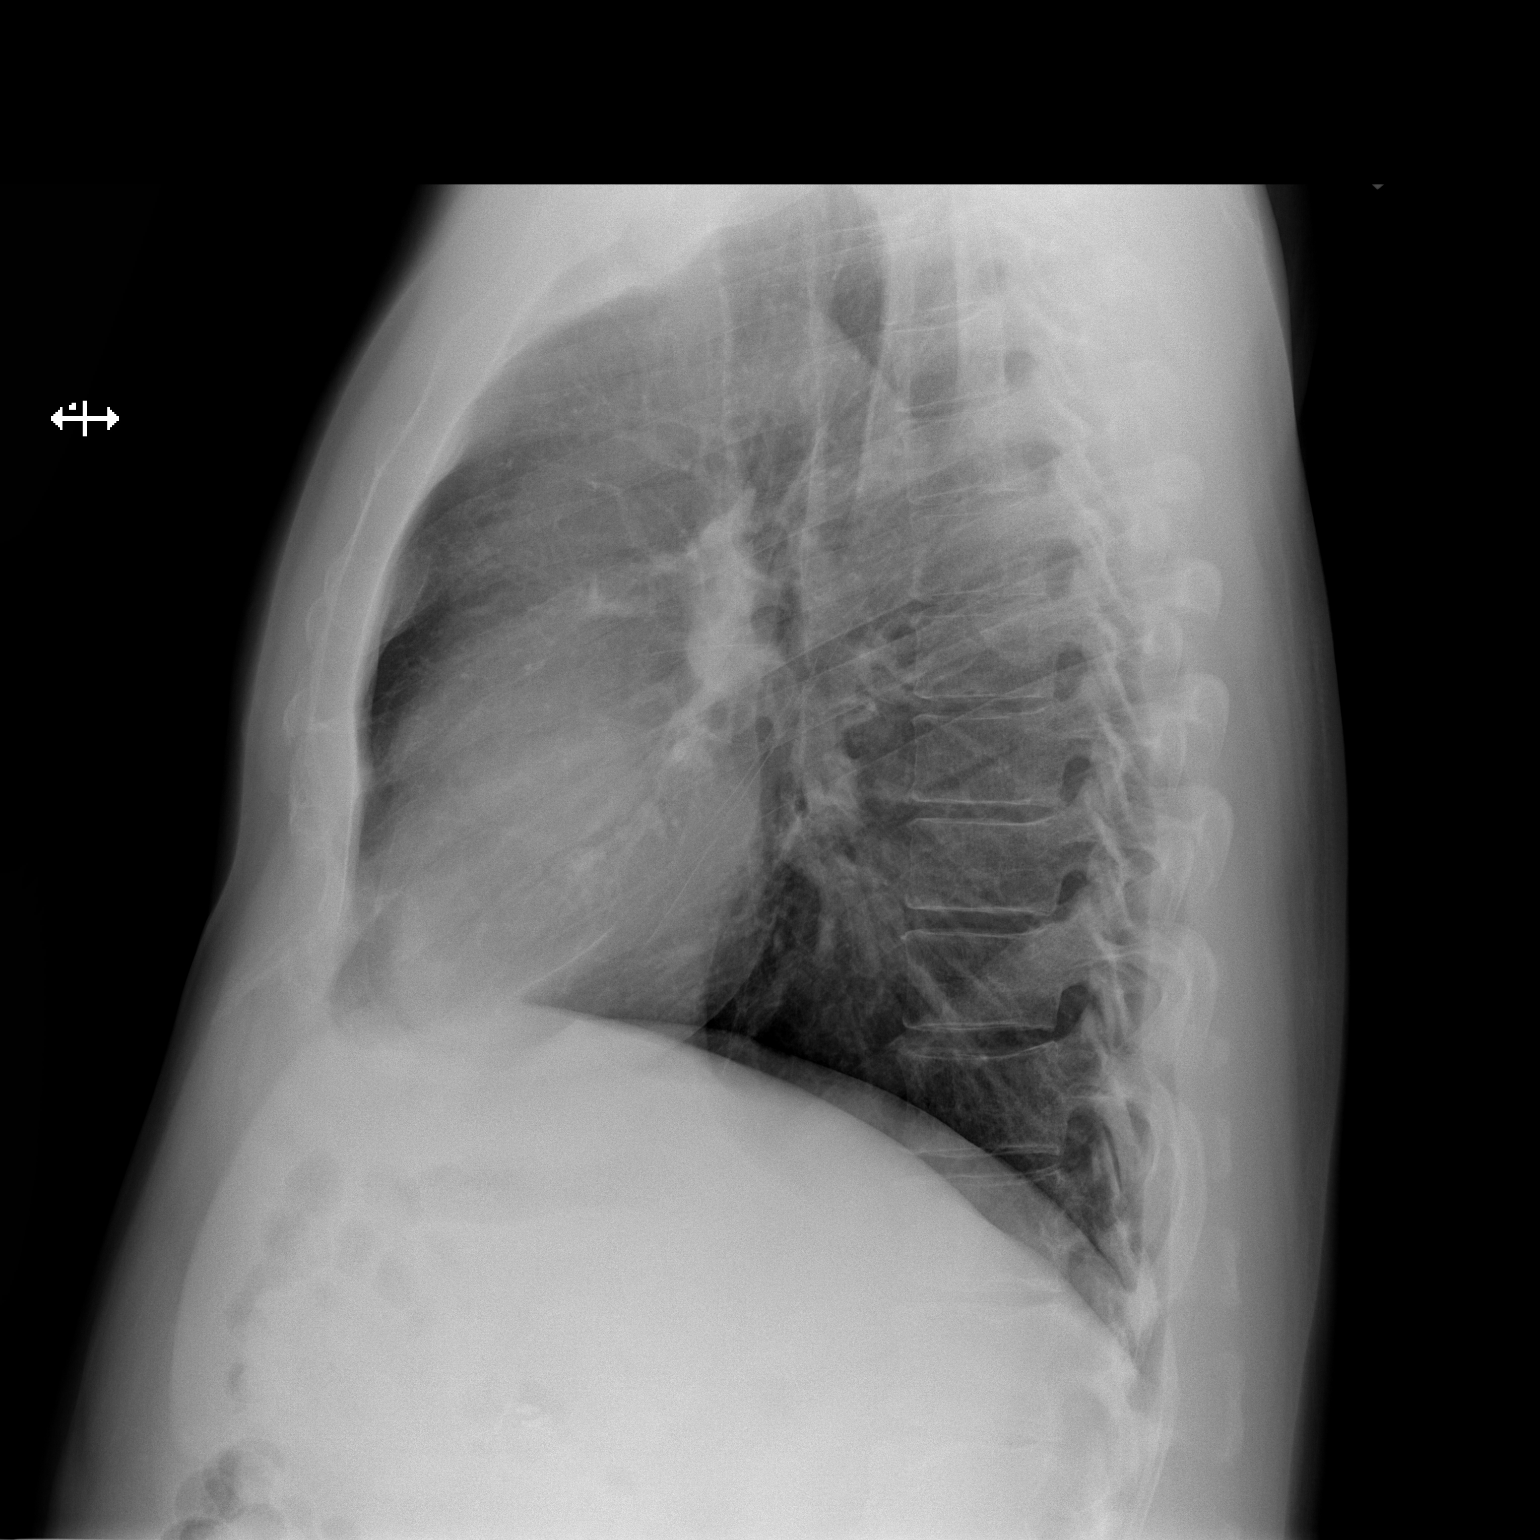

[2 of 2 positions shown; findings below may reference images not displayed]

FINDINGS: The heart size and mediastinal contours are within normal limits.
Both lungs are clear. The visualized skeletal structures are
unremarkable.
IMPRESSION: No active cardiopulmonary disease.

## 2014-09-28 IMAGING — CR DG CHEST 1V PORT
1 series · 1 of 1 positions shown · non-contrast
Comparison: 08/18/2013

CLINICAL DATA: Status post coronary bypass grafting

EXAM:
PORTABLE CHEST - 1 VIEW

[AP]
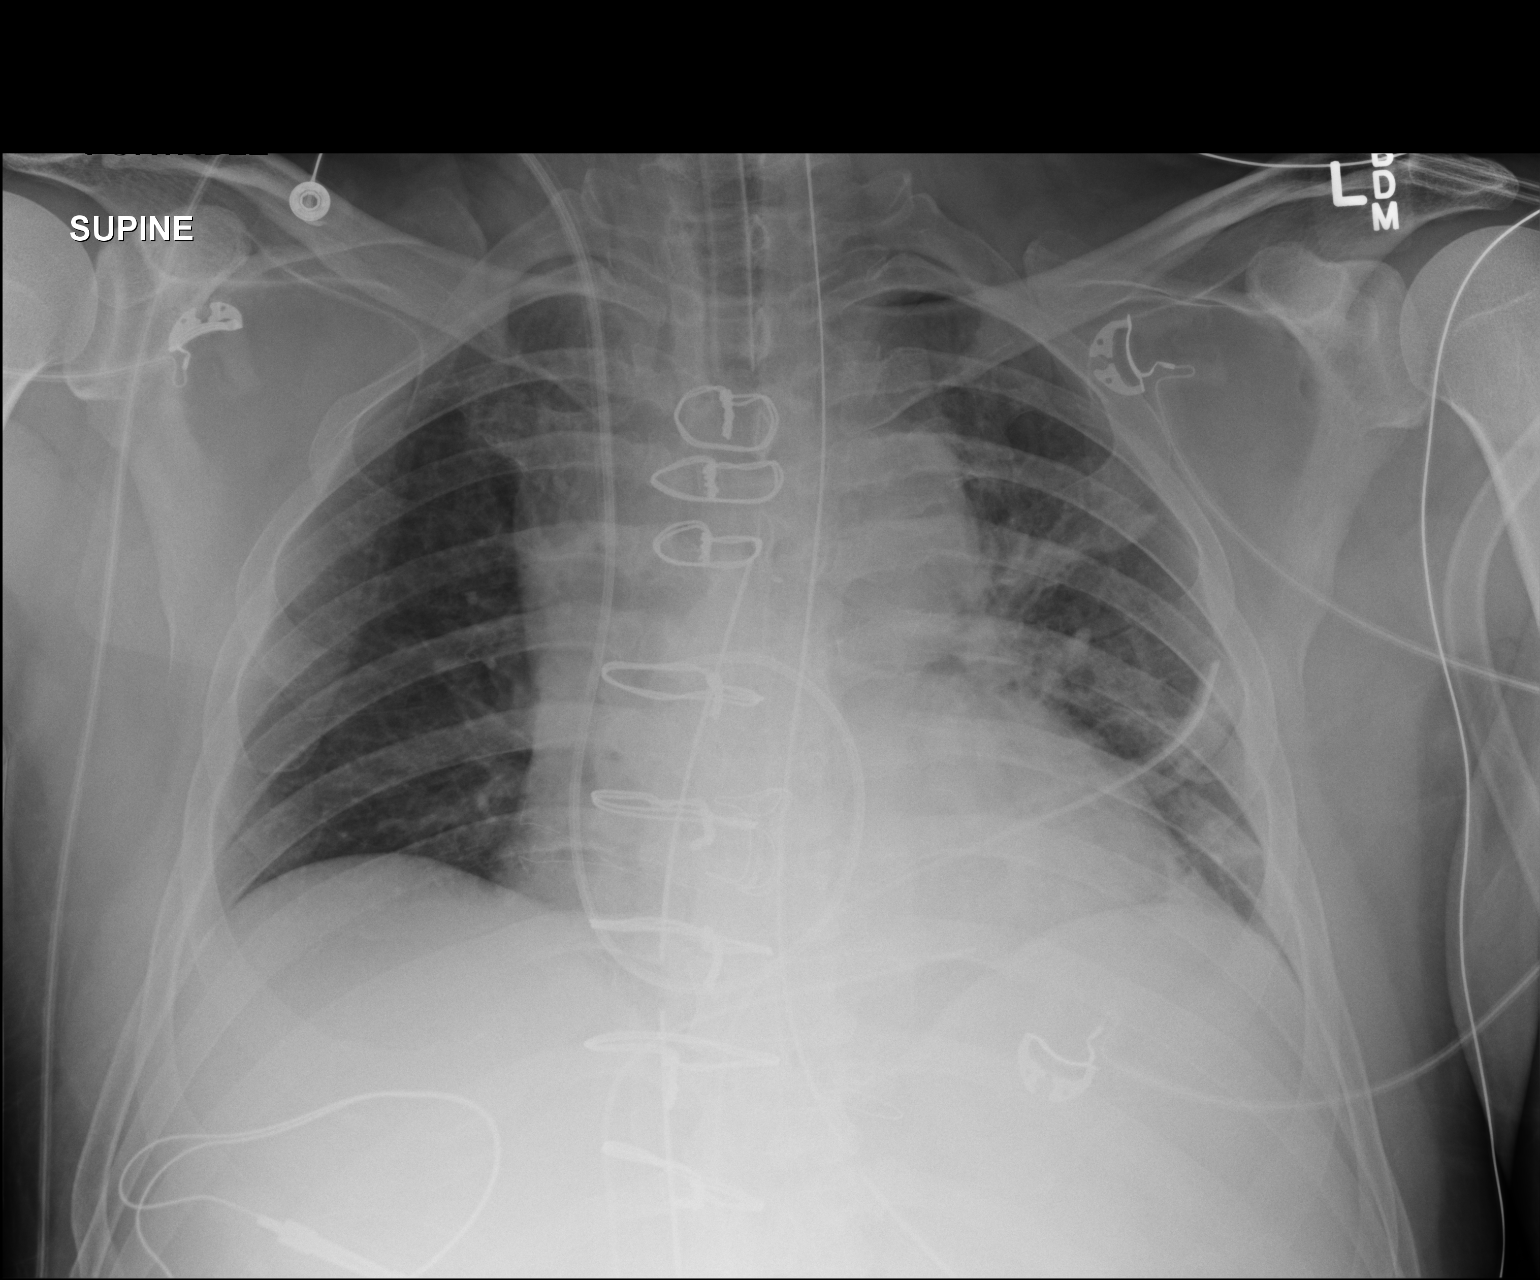

[1 of 1 positions shown; findings below may reference images not displayed]

FINDINGS: Cardiac shadow is prominent consistent with the recent surgical
state. A mediastinal drain and left-sided thoracostomy catheter are
noted in satisfactory position. No pneumothorax is noted.

A Swan-Ganz catheter is noted within the right pulmonary artery. The
endotracheal tube is just above the aortic knob in satisfactory
position. A nasogastric catheter extends into the stomach. No focal
infiltrate is seen. Very minimal atelectasis is noted in the left
lung base.
IMPRESSION: Tubes and lines as described.

Postsurgical changes.

Minimal left basilar atelectasis.

## 2014-09-29 IMAGING — DX DG CHEST 1V PORT
1 series · 1 of 1 positions shown · non-contrast
Comparison: Prior chest x-ray 08/20/2013

CLINICAL DATA: Post aortic valve replacement and coronary artery
bypass graft surgery

EXAM:
PORTABLE CHEST - 1 VIEW

[portable]
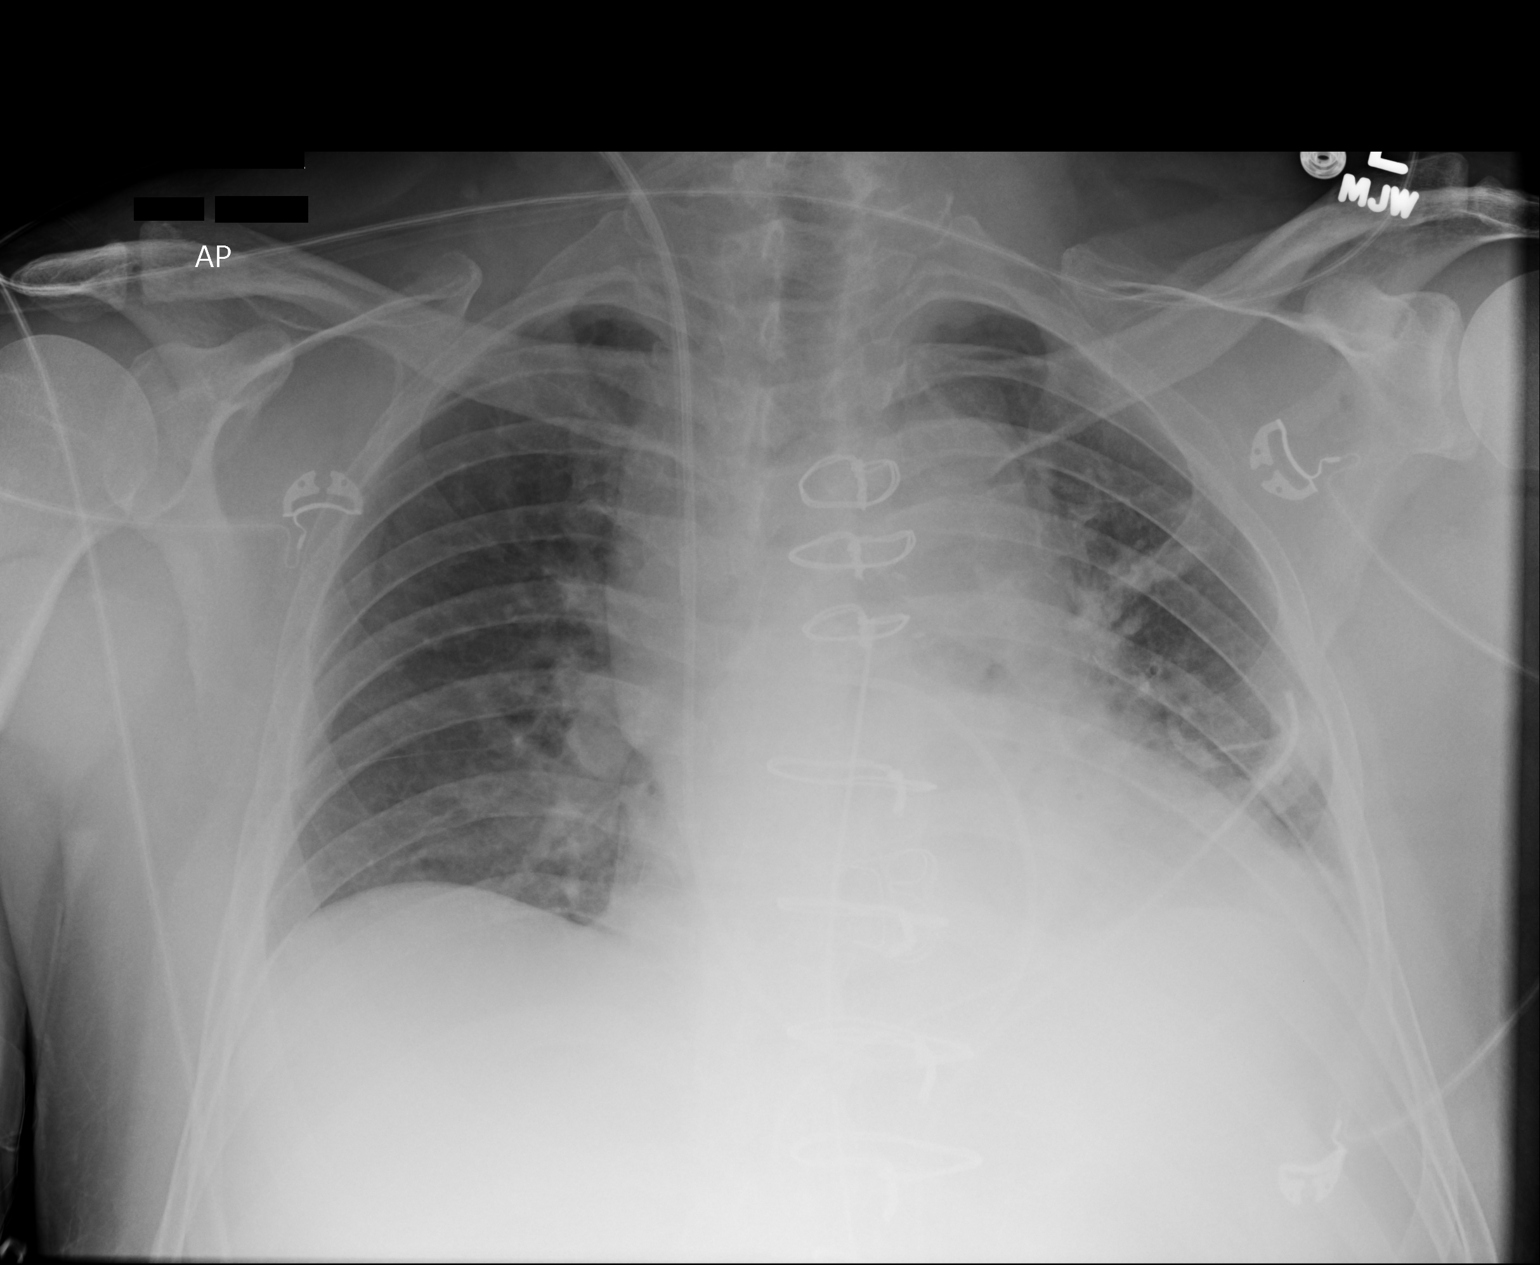

[1 of 1 positions shown; findings below may reference images not displayed]

FINDINGS: The patient has been extubated and the nasogastric tube removed.
Right IJ vascular sheath continues to convey a Swan-Ganz catheter
into the heart. The tip of the Neji is pulled back slightly but
remains within the proximal right main pulmonary artery. Mediastinal
and left thoracostomy tubes remain in unchanged position. Stable
cardiomegaly. Slightly increased prominence of the right mediastinal
soft tissues likely reflecting a venous congestion in the IVC and
right atrial appendage. Surgical changes of median sternotomy with
aortic valve replacement and multivessel CABG. Stable low lung
volumes and left perihilar and basilar atelectasis. Negative for
pulmonary edema, large effusion or pneumothorax. No acute osseous
abnormality.
IMPRESSION: 1. Essentially stable appearance of the chest with low lung volumes
and left perihilar and basilar atelectasis following extubation and
removal of the nasogastric tube.
2. Other support apparatus in stable and satisfactory position.
3. Slightly increased prominence of the right mediastinal contours
suggests increased distension of the superior vena cava and right
atrial appendage.

## 2014-09-30 IMAGING — CR DG CHEST 1V PORT
1 series · 1 of 1 positions shown · non-contrast
Comparison: 08/21/2013

CLINICAL DATA: Status post aortic valve replacement

EXAM:
PORTABLE CHEST - 1 VIEW

[AP]
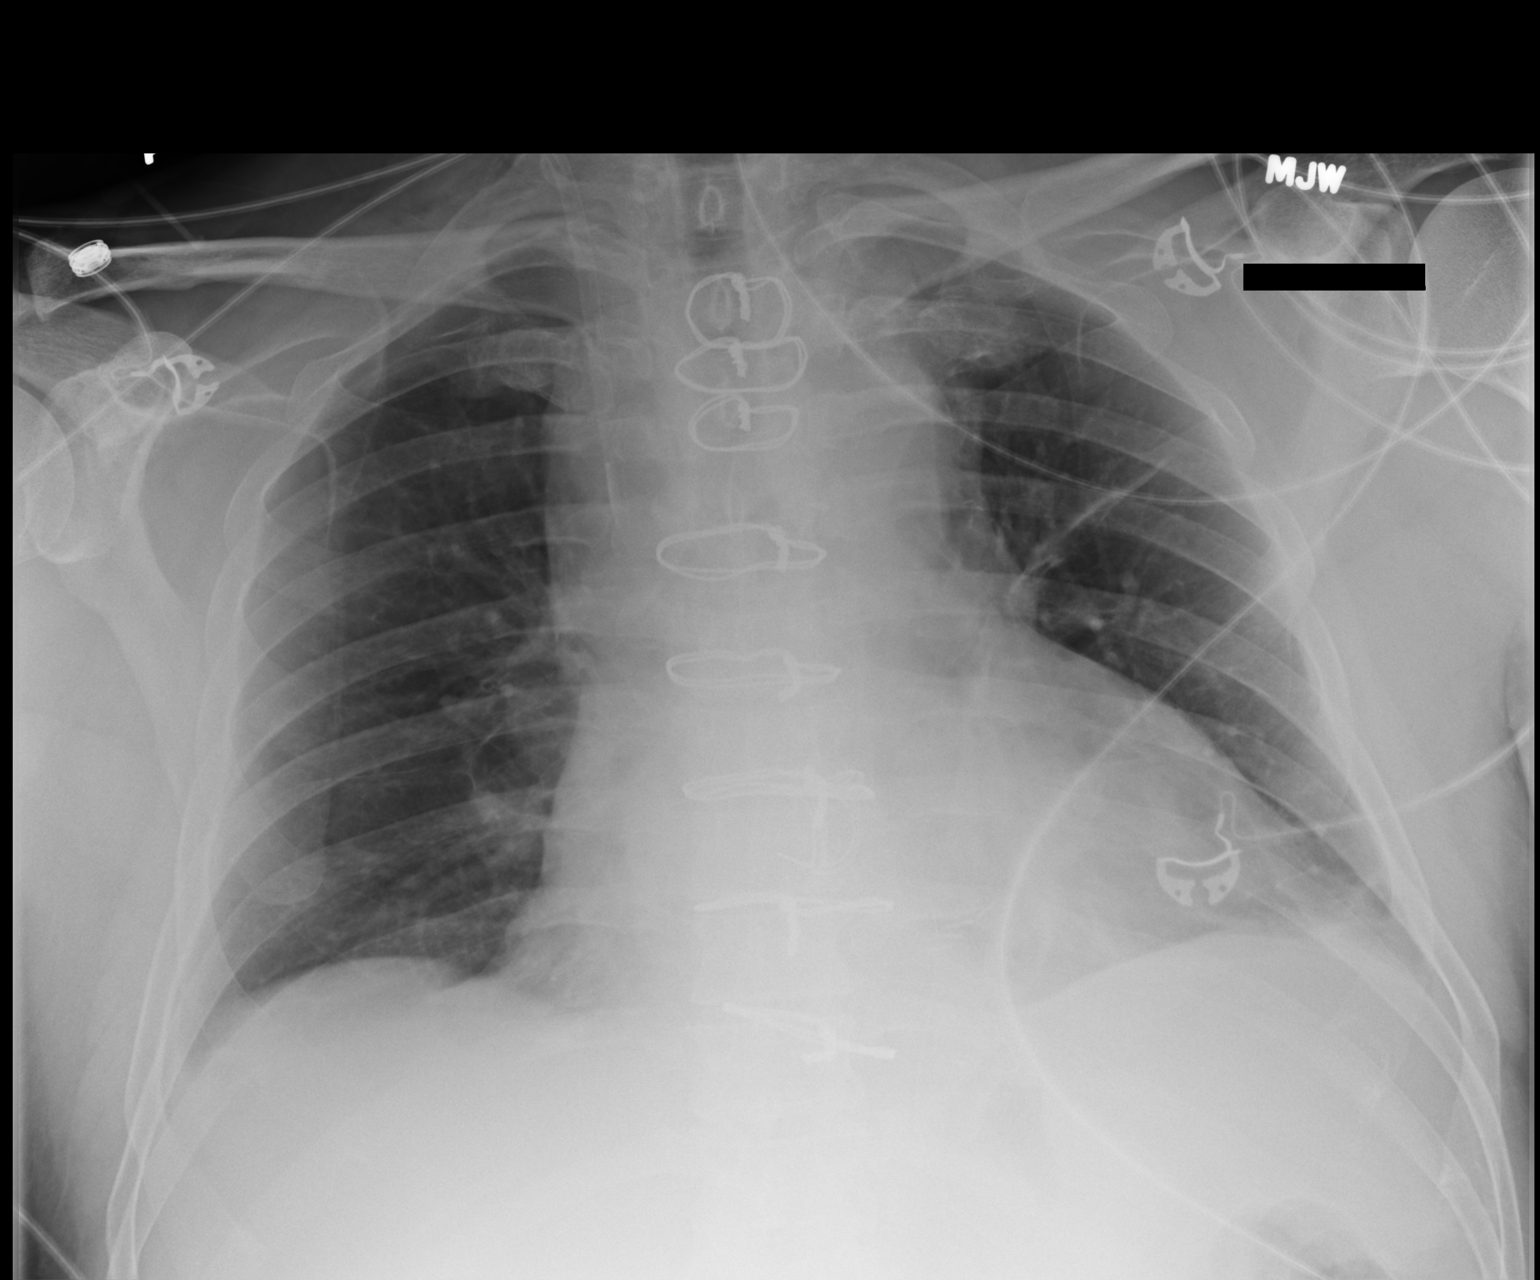

[1 of 1 positions shown; findings below may reference images not displayed]

FINDINGS: Postsurgical changes are again seen. A right jugular central line is
noted. The Swan-Ganz catheter has been removed in the interval. A
mediastinal drain and thoracostomy catheter have also been removed
in the interval. No recurrent pneumothorax is seen. No focal
infiltrate is noted.
IMPRESSION: Interval removal of tubes and lines as described. No acute
abnormality is noted.

## 2014-10-05 ENCOUNTER — Ambulatory Visit: Payer: BC Managed Care – PPO | Admitting: Thoracic Surgery (Cardiothoracic Vascular Surgery)

## 2014-10-05 ENCOUNTER — Other Ambulatory Visit: Payer: BC Managed Care – PPO

## 2014-11-02 ENCOUNTER — Ambulatory Visit (INDEPENDENT_AMBULATORY_CARE_PROVIDER_SITE_OTHER): Payer: BLUE CROSS/BLUE SHIELD | Admitting: Thoracic Surgery (Cardiothoracic Vascular Surgery)

## 2014-11-02 ENCOUNTER — Ambulatory Visit
Admission: RE | Admit: 2014-11-02 | Discharge: 2014-11-02 | Disposition: A | Discharge status: Home / Self Care | Payer: BLUE CROSS/BLUE SHIELD | Source: Ambulatory Visit | Attending: Thoracic Surgery (Cardiothoracic Vascular Surgery) | Admitting: Thoracic Surgery (Cardiothoracic Vascular Surgery)

## 2014-11-02 ENCOUNTER — Encounter: Payer: Self-pay | Admitting: Thoracic Surgery (Cardiothoracic Vascular Surgery)

## 2014-11-02 VITALS — BP 143/92 | HR 68 | Resp 16 | Ht 70.0 in | Wt 205.0 lb

## 2014-11-02 DIAGNOSIS — Z952 Presence of prosthetic heart valve: Secondary | ICD-10-CM | POA: Insufficient documentation

## 2014-11-02 DIAGNOSIS — R918 Other nonspecific abnormal finding of lung field: Secondary | ICD-10-CM

## 2014-11-02 DIAGNOSIS — Z954 Presence of other heart-valve replacement: Secondary | ICD-10-CM

## 2014-11-02 DIAGNOSIS — Z951 Presence of aortocoronary bypass graft: Secondary | ICD-10-CM

## 2014-11-02 DIAGNOSIS — R911 Solitary pulmonary nodule: Secondary | ICD-10-CM

## 2014-11-02 MED ORDER — IOHEXOL 300 MG/ML  SOLN
75.0000 mL | Freq: Once | INTRAMUSCULAR | Status: AC | PRN
  Administered 2014-11-02: 75 mL via INTRAVENOUS

## 2014-11-02 NOTE — Progress Notes (Signed)
[address]       [place] [id number]             [phone]     CARDIOTHORACIC SURGERY OFFICE NOTE  Referring Provider is Donato SchultzSkains, Mark, MD PCP is Lenora BoysFRIED, ROBERT L, MD   HPI:  Patient returns for routine follow-up a proximally 1 year status post aortic valve replacement using a bioprosthetic tissue valve and single-vessel coronary artery bypass grafting on 08/20/2013 for bicuspid native aortic valve with severe symptomatic aortic stenosis. The patient's early postoperative recovery was uncomplicated and he was last seen here in our office on 03/16/2014. Since then he has continued to do very well.  He states that he is back to normal physical activity without any particular limitations. His primary physical limitations stem from chronic back pain. He only gets short of breath if he really pushes himself physically, and he does not experience any exertional chest pain or shortness of breath otherwise. He has remained otherwise healthy over the past year. He has not been seen in follow-up by his cardiologist and he has not had a follow-up echocardiogram performed. He recently went to the dentist and was given prophylactic antibiotics prior to routine dental cleaning. Overall he feels quite well and he reports good progress.   Current Outpatient Prescriptions  Medication Sig Dispense Refill  . aspirin EC 325 MG EC tablet Take 1 tablet (325 mg total) by mouth daily.    . benazepril (LOTENSIN) 5 MG tablet TAKE 1 TABLET BY MOUTH EVERY DAY 30 tablet 9  . fluticasone (FLONASE) 50 MCG/ACT nasal spray Place 2 sprays into the nose daily as needed for allergies.     . hydrochlorothiazide (HYDRODIURIL) 25 MG tablet Take 25 mg by mouth daily. 1/2 tablet per day    . metoprolol tartrate (LOPRESSOR) 25 MG tablet TAKE 1 TABLET BY MOUTH TWICE A DAY 60 tablet 9  . Omega-3 Fatty Acids (FISH OIL) 500 MG CAPS Take by mouth daily.    . simvastatin (ZOCOR) 20 MG tablet TAKE 1 TABLET BY  MOUTH EVERY DAY AT 6 PM 30 tablet 9   No current facility-administered medications for this visit.      Physical Exam:   BP 143/92 mmHg  Pulse 68  Resp 16  Ht 5\' 10"  (1.778 m)  Wt 205 lb (92.987 kg)  BMI 29.41 kg/m2  SpO2 97%  General:  Well-appearing  Chest:   Clear to auscultation  CV:   Regular rate and rhythm with harsh systolic murmur best along the right sternal border  Incisions:  Completely healed, sternum is stable  Abdomen:  Soft and nontender  Extremities:  Warm and well-perfused  Diagnostic Tests:  CT CHEST WITH CONTRAST  TECHNIQUE: Multidetector CT imaging of the chest was performed during intravenous contrast administration.  CONTRAST: 75mL OMNIPAQUE IOHEXOL 300 MG/ML SOLN  COMPARISON: 03/20/2014, 07/23/2013  FINDINGS: The lungs are again well aerated bilaterally. Small parenchymal nodules are identified bilaterally and stable in appearance when compared with the prior study. No new nodules are seen. The largest of these nodules lies in the left lower lobe laterally and measures 5 mm.  The thoracic inlet is within normal limits. Mild dilatation of the ascending aorta is seen. It measures approximately 4.2 cm in greatest dimension on axial images. No significant hilar or mediastinal adenopathy is noted. Changes consistent with coronary bypass grafting are noted.  The gallbladder has been surgically removed. The remainder the upper abdomen is within normal limits.  No acute bony abnormality is seen.  IMPRESSION: Stable bilateral parenchymal nodules. Given their stability over time, these are benign in etiology.  Stable dilatation of the ascending aorta given some variation in the imaging technique.   Electronically Signed  By: Alcide Clever M.D.  On: 11/02/2014 10:30    Impression:  Patient is doing very well more than one year status post aortic valve replacement with a bioprosthetic tissue valve and single-vessel coronary  artery bypass grafting. Late follow-up CT scan of the chest demonstrates radiographic stability of the small bilateral pulmonary nodules, consistent with benign etiology.  Plan:  The patient has been reminded to call and schedule follow up appointment with his cardiologist as soon as practical. He should have a routine follow-up echocardiogram performed. He has been reassured that the small pulmonary nodules have remained stable on follow-up imaging study and are likely benign. No further follow-up is necessary. In the future the patient will call and return to see [place] as needed.  I spent in excess of 15 minutes during the conduct of this office consultation and >50% of this time involved direct face-to-face encounter with the patient for counseling and/or coordination of their care.    Jeffery Decent. Cornelius Moras, MD 11/02/2014 11:59 AM

## 2014-11-02 NOTE — Patient Instructions (Signed)
Schedule follow up appointment and ECHOCARDIOGRAM at Dr Anne FuSkains' office as soon as practical

## 2014-11-03 ENCOUNTER — Other Ambulatory Visit: Payer: Self-pay | Admitting: *Deleted

## 2014-11-03 DIAGNOSIS — I350 Nonrheumatic aortic (valve) stenosis: Secondary | ICD-10-CM

## 2014-11-05 ENCOUNTER — Encounter: Payer: Self-pay | Admitting: Cardiology

## 2014-11-05 ENCOUNTER — Ambulatory Visit (HOSPITAL_COMMUNITY): Payer: BLUE CROSS/BLUE SHIELD | Attending: Thoracic Surgery (Cardiothoracic Vascular Surgery) | Admitting: Radiology

## 2014-11-05 ENCOUNTER — Ambulatory Visit (INDEPENDENT_AMBULATORY_CARE_PROVIDER_SITE_OTHER): Payer: BLUE CROSS/BLUE SHIELD | Admitting: Cardiology

## 2014-11-05 VITALS — BP 120/100 | HR 65 | Ht 70.0 in | Wt 210.0 lb

## 2014-11-05 DIAGNOSIS — I25119 Atherosclerotic heart disease of native coronary artery with unspecified angina pectoris: Secondary | ICD-10-CM

## 2014-11-05 DIAGNOSIS — I2510 Atherosclerotic heart disease of native coronary artery without angina pectoris: Secondary | ICD-10-CM

## 2014-11-05 DIAGNOSIS — I350 Nonrheumatic aortic (valve) stenosis: Secondary | ICD-10-CM | POA: Diagnosis present

## 2014-11-05 DIAGNOSIS — I10 Essential (primary) hypertension: Secondary | ICD-10-CM | POA: Insufficient documentation

## 2014-11-05 DIAGNOSIS — Z954 Presence of other heart-valve replacement: Secondary | ICD-10-CM

## 2014-11-05 DIAGNOSIS — Q231 Congenital insufficiency of aortic valve: Secondary | ICD-10-CM

## 2014-11-05 DIAGNOSIS — Z953 Presence of xenogenic heart valve: Secondary | ICD-10-CM

## 2014-11-05 DIAGNOSIS — E785 Hyperlipidemia, unspecified: Secondary | ICD-10-CM | POA: Diagnosis not present

## 2014-11-05 DIAGNOSIS — Z952 Presence of prosthetic heart valve: Secondary | ICD-10-CM

## 2014-11-05 DIAGNOSIS — Z951 Presence of aortocoronary bypass graft: Secondary | ICD-10-CM

## 2014-11-05 MED ORDER — BENAZEPRIL HCL 10 MG PO TABS: 10.0000 mg | ORAL_TABLET | Freq: Every day | ORAL | Status: DC

## 2014-11-05 NOTE — Progress Notes (Signed)
Echocardiogram performed.  

## 2014-11-05 NOTE — Progress Notes (Signed)
[address] [place], [place]  [address] Phone: [phone] Fax:  [phone]  Date:  11/05/2014   ID:  Jaaron, Oleson 1956-10-05, MRN [id number]  PCP:  Lenora Boys, MD   History of Present Illness: Jeffery [name] is a 59 y.o. male status post aortic valve replacement with bioprosthetic valve 08/20/13 with single vessel bypass LIMA to LAD here for followup.  Approximately 15 years ago he was diagnosed with bicuspid valve it is been followed for several years. He began to experience chest discomfort, shortness of breath. Sinotubular junction 4.2 cm.  Overall he's been doing quite well, mild chest numbness. He is back to work.   He feels well, no chest pain, no shortness of breath. Dental prophylaxis.  Wt Readings from Last 3 Encounters:  11/05/14 210 lb (95.255 kg)  11/02/14 205 lb (92.987 kg)  03/16/14 207 lb (93.895 kg)     Past Medical History  Diagnosis Date  . Calculus of gallbladder   . Cholecystitis   . Osteoarthritis   . Hypertension   . Aortic stenosis   . Hypertriglyceridemia   . Bicuspid aortic valve   . Amaurosis fugax   . Chest pain on exertion   . Exertional shortness of breath   . Amaurosis fugax 2012  . Incidental pulmonary nodule, > 3mm and < 8mm 07/23/2013    5 mm L lung nodule and 4 mm R lung nodule seen incidentally on chest CT  . Coronary artery disease 08/01/2013    LAD stenosis  . Dysrhythmia   . Heart murmur   . CHF (congestive heart failure)   . S/P aortic valve replacement with bioprosthetic valve 08/20/2013    25 mm [facility] Ease bovine pericardial tissue valve  . S/P CABG x 1 08/20/2013    LIMA to LAD    Past Surgical History  Procedure Laterality Date  . Umbillical hernia    . Cholecystectomy    . Nose surgery    . Aortic valve replacement N/A 08/20/2013    Procedure: AORTIC VALVE REPLACEMENT (AVR);  Surgeon: Purcell Nails, MD;  Location: [facility] OR;  Service: Open Heart Surgery;  Laterality:  N/A;  . Coronary artery bypass graft N/A 08/20/2013    Procedure: CORONARY ARTERY BYPASS GRAFTING (CABG);  Surgeon: Purcell Nails, MD;  Location: [facility] OR;  Service: Open Heart Surgery;  Laterality: N/A;  CABG x one, using left internal mammary artery  . Intraoperative transesophageal echocardiogram N/A 08/20/2013    Procedure: INTRAOPERATIVE TRANSESOPHAGEAL ECHOCARDIOGRAM;  Surgeon: Purcell Nails, MD;  Location: [facility] OR;  Service: Open Heart Surgery;  Laterality: N/A;  . Left and right heart catheterization with coronary angiogram N/A 08/01/2013    Procedure: LEFT AND RIGHT HEART CATHETERIZATION WITH CORONARY ANGIOGRAM;  Surgeon: Donato Schultz, MD;  Location: [facility] CATH LAB;  Service: Cardiovascular;  Laterality: N/A;    Current Outpatient Prescriptions  Medication Sig Dispense Refill  . aspirin EC 325 MG EC tablet Take 1 tablet (325 mg total) by mouth daily.    . benazepril (LOTENSIN) 5 MG tablet TAKE 1 TABLET BY MOUTH EVERY DAY 30 tablet 9  . fluticasone (FLONASE) 50 MCG/ACT nasal spray Place 2 sprays into the nose daily as needed for allergies.     . hydrochlorothiazide (HYDRODIURIL) 25 MG tablet Take 25 mg by mouth daily. 1/2 tablet per day    . metoprolol tartrate (LOPRESSOR) 25 MG tablet TAKE 1 TABLET BY MOUTH TWICE  A DAY 60 tablet 9  . Omega-3 Fatty Acids (FISH OIL) 500 MG CAPS Take by mouth daily.    . simvastatin (ZOCOR) 20 MG tablet TAKE 1 TABLET BY MOUTH EVERY DAY AT 6 PM 30 tablet 9   No current facility-administered medications for this visit.    Allergies:   No Known Allergies  Social History:  The patient  reports that he quit smoking about 22 years ago. He has quit using smokeless tobacco. He reports that he drinks about 0.6 oz of alcohol per week. He reports that he does not use illicit drugs.   ROS:  Please see the history of present illness.   Denies any bleeding, syncope, orthopnea, PND.   PHYSICAL EXAM: VS:  BP 120/100 mmHg  Pulse 65  Ht 5\' 10"  (1.778 m)  Wt 210 lb  (95.255 kg)  BMI 30.13 kg/m2 Well nourished, well developed, in no acute distress HEENT: normal Neck: no JVD Cardiac:  normal S1, S2; RRR; 1/6 SM RUSB, scar noted.  Lungs:  clear to auscultation bilaterally, no wheezing, rhonchi or rales Abd: soft, nontender, no hepatomegaly Ext: no edemaRadial artery intact. Normal capillary refill bilaterally. Normal warmth and digits bilaterally. Normal movement. Skin: warm and dryScar well healing. Neuro: no focal abnormalities noted  EKG: 11/05/14-sinus rhythm, first-degree AV block, heart rate 65, left anterior fascicular block, nonspecific interventricular block.  Postoperative echocardiogram: 11/05/14-normal ejection fraction-normal functioning valve.  ASSESSMENT AND PLAN:  1. Status post aortic valve replacement-overall doing well. Dr. Cornelius Moraswen. Prior bicuspid valve. Last note reviewed. Overall doing well. Progressing well. Echocardiogram reassuring. Dental prophylaxis. 2. Coronary artery disease-LIMA to LAD. No anginal symptoms. Continue with secondary prevention. 3. Hypertriglyceridemia-continue with diet, exercise, simvastatin 4. Hypertension-essential-Elevated in past 2 visits. I'm going to increase his benazepril from 5 g to 10 mg. Continue to monitor at home. He has 2 wrist blood pressure cuffs. 5. 6 month followup  Signed, Donato SchultzMark Demarea Lorey, MD [facility]  11/05/2014 9:43 AM

## 2014-11-05 NOTE — Patient Instructions (Signed)
Please increase your Benazepril to 10 mg a day. Continue all other medications as listed.  Make sure you take antibiotics before any dental procedures.  Follow up in 6 months with Dr. Anne FuSkains.  You will receive a letter in the mail 2 months before you are due.  Please call us when you receive this letter to schedule your follow up appointment.  Thank you for choosing [facility]!!

## 2015-05-27 ENCOUNTER — Ambulatory Visit: Payer: BLUE CROSS/BLUE SHIELD | Admitting: Cardiology

## 2015-06-04 ENCOUNTER — Ambulatory Visit (INDEPENDENT_AMBULATORY_CARE_PROVIDER_SITE_OTHER): Payer: BLUE CROSS/BLUE SHIELD | Admitting: Cardiology

## 2015-06-04 ENCOUNTER — Encounter: Payer: Self-pay | Admitting: Cardiology

## 2015-06-04 VITALS — BP 122/86 | HR 81 | Wt 206.8 lb

## 2015-06-04 DIAGNOSIS — Z954 Presence of other heart-valve replacement: Secondary | ICD-10-CM | POA: Diagnosis not present

## 2015-06-04 DIAGNOSIS — Z951 Presence of aortocoronary bypass graft: Secondary | ICD-10-CM

## 2015-06-04 DIAGNOSIS — Z952 Presence of prosthetic heart valve: Secondary | ICD-10-CM

## 2015-06-04 DIAGNOSIS — Z79899 Other long term (current) drug therapy: Secondary | ICD-10-CM

## 2015-06-04 DIAGNOSIS — E781 Pure hyperglyceridemia: Secondary | ICD-10-CM

## 2015-06-04 DIAGNOSIS — Z953 Presence of xenogenic heart valve: Secondary | ICD-10-CM

## 2015-06-04 DIAGNOSIS — I10 Essential (primary) hypertension: Secondary | ICD-10-CM

## 2015-06-04 NOTE — Patient Instructions (Signed)
Medication Instructions:  You may decrease your Metoprolol in half for 3 days then stop. Continue all other medications as listed.  Labwork: Please have blood work today (Lipid and ALT)  Follow-Up: Follow up in 1 year with Dr. Anne Fu.  You will receive a letter in the mail 2 months before you are due.  Please call us when you receive this letter to schedule your follow up appointment.  Thank you for choosing [facility]!!

## 2015-06-04 NOTE — Addendum Note (Signed)
Addended by: Channing Mutters on: 06/04/2015 04:19 PM   Modules accepted: Orders

## 2015-06-04 NOTE — Progress Notes (Signed)
[address] [place], [place]  [address] Phone: [phone] Fax:  [phone]  Date:  06/04/2015   ID:  Gad, Aymond Jul 01, 1956, MRN [id number]  PCP:  Lenora Boys, MD   History of Present Illness: Jeffery [name] is a 59 y.o. male status post aortic valve replacement with bioprosthetic valve 08/20/13 with single vessel bypass LIMA to LAD here for followup.  Approximately 15 years ago he was diagnosed with bicuspid valve it is been followed for several years. He began to experience chest discomfort, shortness of breath. Sinotubular junction 4.2 cm.  Overall he's been doing quite well, mild chest numbness. He is back to work.   He overall just feels tired. He wishes he had more energy. Wonders if beta blocker is cause.  Denies chest pain, shortness of breath, orthopnea, PND.  Wt Readings from Last 3 Encounters:  06/04/15 206 lb 12 oz (93.781 kg)  11/05/14 210 lb (95.255 kg)  11/02/14 205 lb (92.987 kg)     Past Medical History  Diagnosis Date  . Calculus of gallbladder   . Cholecystitis   . Osteoarthritis   . Hypertension   . Aortic stenosis   . Hypertriglyceridemia   . Bicuspid aortic valve   . Amaurosis fugax   . Chest pain on exertion   . Exertional shortness of breath   . Amaurosis fugax 2012  . Incidental pulmonary nodule, > 3mm and < 8mm 07/23/2013    5 mm L lung nodule and 4 mm R lung nodule seen incidentally on chest CT  . Coronary artery disease 08/01/2013    LAD stenosis  . Dysrhythmia   . Heart murmur   . CHF (congestive heart failure)   . S/P aortic valve replacement with bioprosthetic valve 08/20/2013    25 mm [facility] Ease bovine pericardial tissue valve  . S/P CABG x 1 08/20/2013    LIMA to LAD    Past Surgical History  Procedure Laterality Date  . Umbillical hernia    . Cholecystectomy    . Nose surgery    . Aortic valve replacement N/A 08/20/2013    Procedure: AORTIC VALVE REPLACEMENT (AVR);  Surgeon:  Purcell Nails, MD;  Location: [facility] OR;  Service: Open Heart Surgery;  Laterality: N/A;  . Coronary artery bypass graft N/A 08/20/2013    Procedure: CORONARY ARTERY BYPASS GRAFTING (CABG);  Surgeon: Purcell Nails, MD;  Location: [facility] OR;  Service: Open Heart Surgery;  Laterality: N/A;  CABG x one, using left internal mammary artery  . Intraoperative transesophageal echocardiogram N/A 08/20/2013    Procedure: INTRAOPERATIVE TRANSESOPHAGEAL ECHOCARDIOGRAM;  Surgeon: Purcell Nails, MD;  Location: [facility] OR;  Service: Open Heart Surgery;  Laterality: N/A;  . Left and right heart catheterization with coronary angiogram N/A 08/01/2013    Procedure: LEFT AND RIGHT HEART CATHETERIZATION WITH CORONARY ANGIOGRAM;  Surgeon: Donato Schultz, MD;  Location: [facility] CATH LAB;  Service: Cardiovascular;  Laterality: N/A;    Current Outpatient Prescriptions  Medication Sig Dispense Refill  . loratadine (CLARITIN) 10 MG tablet Take 10 mg by mouth daily.    . Multiple Vitamins-Minerals (MENS MULTIVITAMIN PLUS PO) Take 1 tablet by mouth daily.    Marland Kitchen aspirin EC 325 MG EC tablet Take 1 tablet (325 mg total) by mouth daily.    . benazepril (LOTENSIN) 10 MG tablet Take 1 tablet (10 mg total) by mouth daily. 90 tablet 3  . fluticasone (FLONASE) 50 MCG/ACT  nasal spray Place 2 sprays into the nose daily as needed for allergies.     . hydrochlorothiazide (HYDRODIURIL) 25 MG tablet Take 25 mg by mouth daily. 1/2 tablet per day    . metoprolol tartrate (LOPRESSOR) 25 MG tablet TAKE 1 TABLET BY MOUTH TWICE A DAY 60 tablet 9  . Omega-3 Fatty Acids (FISH OIL) 500 MG CAPS Take by mouth daily.    . simvastatin (ZOCOR) 20 MG tablet TAKE 1 TABLET BY MOUTH EVERY DAY AT 6 PM 30 tablet 9   No current facility-administered medications for this visit.    Allergies:   No Known Allergies  Social History:  The patient  reports that he quit smoking about 22 years ago. He has quit using smokeless tobacco. He reports that he drinks about 0.6 oz of  alcohol per week. He reports that he does not use illicit drugs.   ROS:  Please see the history of present illness.   Denies any bleeding, syncope, orthopnea, PND.   PHYSICAL EXAM: VS:  BP 122/86 mmHg  Pulse 81  Wt 206 lb 12 oz (93.781 kg)  SpO2 96% Well nourished, well developed, in no acute distress HEENT: normal Neck: no JVD Cardiac:  normal S1, S2; RRR; 1/6 SM RUSB, scar noted.  Lungs:  clear to auscultation bilaterally, no wheezing, rhonchi or rales Abd: soft, nontender, no hepatomegaly Ext: no edemaRadial artery intact. Normal capillary refill bilaterally. Normal warmth and digits bilaterally. Normal movement. Skin: warm and dryScar well healing. Neuro: no focal abnormalities noted  EKG: 11/05/14-sinus rhythm, first-degree AV block, heart rate 65, left anterior fascicular block, nonspecific interventricular block.  Postoperative echocardiogram: 11/05/14-normal ejection fraction-normal functioning valve.  ASSESSMENT AND PLAN:  1. Status post aortic valve replacement-overall doing well. Dr. Cornelius Moras. Prior bicuspid valve. Last note reviewed. Overall doing well. Progressing well. Echocardiogram reassuring. Dental prophylaxis. 2. Coronary artery disease-LIMA to LAD. No anginal symptoms. Continue with secondary prevention. 3. Hypertriglyceridemia-continue with diet, exercise, simvastatin. Checking lipid panel 4. Hypertension-essential- Continue to monitor at home. He has 2 wrist blood pressure cuffs. At this point, I will decrease/stopped his metoprolol because of his fatigue symptoms. But see if this helps. He is most 2 years post surgery. This seems reasonable. He also had a first-degree AV block on EKG. 5. 12 month followup  Signed, Donato Schultz, MD [facility]  06/04/2015 4:04 PM

## 2015-06-05 LAB — LIPID PANEL
Cholesterol: 146 mg/dL (ref 125–200)
HDL: 33 mg/dL — AB (ref >=40)
LDL CALC: 62 mg/dL (ref <130)
TRIGLYCERIDES: 257 mg/dL — AB (ref <150)
Total CHOL/HDL Ratio: 4.4 Ratio (ref <=5.0)
VLDL: 51 mg/dL — ABNORMAL HIGH (ref <30)

## 2015-06-05 LAB — ALT: ALT: 30 U/L (ref 9–46)

## 2015-06-28 ENCOUNTER — Telehealth: Payer: Self-pay

## 2015-06-28 MED ORDER — HYDROCHLOROTHIAZIDE 25 MG PO TABS: 25.0000 mg | ORAL_TABLET | Freq: Every day | ORAL | Status: DC

## 2015-06-28 NOTE — Telephone Encounter (Signed)
OK to refill for up to 1 yr.  Pt was just seen here by Dr Anne Fu.

## 2015-06-28 NOTE — Telephone Encounter (Signed)
[name], MD at 06/04/2015 4:04 PM  hydrochlorothiazide (HYDRODIURIL) 25 MG tabletTake 25 mg by mouth daily. 1/2 tablet per day

## 2015-08-31 ENCOUNTER — Other Ambulatory Visit: Payer: Self-pay | Admitting: Cardiology

## 2015-10-22 ENCOUNTER — Other Ambulatory Visit: Payer: Self-pay | Admitting: *Deleted

## 2015-10-22 MED ORDER — SIMVASTATIN 20 MG PO TABS: ORAL_TABLET | ORAL | Status: DC

## 2015-10-28 ENCOUNTER — Encounter: Payer: Self-pay | Admitting: Cardiology

## 2015-10-28 ENCOUNTER — Ambulatory Visit (INDEPENDENT_AMBULATORY_CARE_PROVIDER_SITE_OTHER): Payer: BLUE CROSS/BLUE SHIELD | Admitting: Cardiology

## 2015-10-28 VITALS — BP 126/84 | HR 74 | Ht 69.0 in | Wt 207.8 lb

## 2015-10-28 DIAGNOSIS — I350 Nonrheumatic aortic (valve) stenosis: Secondary | ICD-10-CM | POA: Diagnosis not present

## 2015-10-28 DIAGNOSIS — Z954 Presence of other heart-valve replacement: Secondary | ICD-10-CM

## 2015-10-28 DIAGNOSIS — M6289 Other specified disorders of muscle: Secondary | ICD-10-CM

## 2015-10-28 DIAGNOSIS — Z952 Presence of prosthetic heart valve: Secondary | ICD-10-CM

## 2015-10-28 DIAGNOSIS — R531 Weakness: Secondary | ICD-10-CM

## 2015-10-28 DIAGNOSIS — R0789 Other chest pain: Secondary | ICD-10-CM

## 2015-10-28 DIAGNOSIS — Z951 Presence of aortocoronary bypass graft: Secondary | ICD-10-CM

## 2015-10-28 NOTE — Patient Instructions (Signed)
Medication Instructions:  The current medical regimen is effective;  continue present plan and medications.  You have been referred to neurology for the evaluation of left sided weakness.  Follow-Up: Follow up in 6 months with Dr. Anne Fu.  You will receive a letter in the mail 2 months before you are due.  Please call us when you receive this letter to schedule your follow up appointment.  If you need a refill on your cardiac medications before your next appointment, please call your pharmacy.  Thank you for choosing [facility]!!

## 2015-10-28 NOTE — Progress Notes (Signed)
[address], [address] [place], [place]  [address] Phone: [phone] Fax:  [phone]  Date:  10/28/2015   ID:  Jeffery [name], DOB Feb 23, 1956, MRN [id number]  PCP:  No PCP Per Patient   History of Present Illness: Jeffery [name] is a 60 y.o. male status post aortic valve replacement with bioprosthetic valve 08/20/13 with single vessel bypass LIMA to LAD here for followup.  Approximately 15 years ago he was diagnosed with bicuspid valve it is been followed for several years. He began to experience chest discomfort, shortness of breath. Sinotubular junction 4.2 cm.   Had dull chest pain whole day, then went away, the next day . , no shortness of breath, orthopnea, PND.  Has had left sided arm and leg weakness for 6 months. Hard to control left hand. Been going on so long can not remember initial symptoms. Helped daughter move and messed back up and seems to be going on since then. See's Chiropractor.   Restless sleep. Sometimes he flails in his sleep. His wife has a water bottle sprays him if needed.  Wt Readings from Last 3 Encounters:  10/28/15 207 lb 12.8 oz (94.257 kg)  06/04/15 206 lb 12 oz (93.781 kg)  11/05/14 210 lb (95.255 kg)     Past Medical History  Diagnosis Date  . Calculus of gallbladder   . Cholecystitis   . Osteoarthritis   . Hypertension   . Aortic stenosis   . Hypertriglyceridemia   . Bicuspid aortic valve   . Amaurosis fugax   . Chest pain on exertion   . Exertional shortness of breath   . Amaurosis fugax 2012  . Incidental pulmonary nodule, > 3mm and < 8mm 07/23/2013    5 mm L lung nodule and 4 mm R lung nodule seen incidentally on chest CT  . Coronary artery disease 08/01/2013    LAD stenosis  . Dysrhythmia   . Heart murmur   . CHF (congestive heart failure) (HCC)   . S/P aortic valve replacement with bioprosthetic valve 08/20/2013    25 mm [facility] Ease bovine pericardial tissue valve  . S/P CABG x 1 08/20/2013   LIMA to LAD    Past Surgical History  Procedure Laterality Date  . Umbillical hernia    . Cholecystectomy    . Nose surgery    . Aortic valve replacement N/A 08/20/2013    Procedure: AORTIC VALVE REPLACEMENT (AVR);  Surgeon: Purcell Nails, MD;  Location: [facility] OR;  Service: Open Heart Surgery;  Laterality: N/A;  . Coronary artery bypass graft N/A 08/20/2013    Procedure: CORONARY ARTERY BYPASS GRAFTING (CABG);  Surgeon: Purcell Nails, MD;  Location: [facility] OR;  Service: Open Heart Surgery;  Laterality: N/A;  CABG x one, using left internal mammary artery  . Intraoperative transesophageal echocardiogram N/A 08/20/2013    Procedure: INTRAOPERATIVE TRANSESOPHAGEAL ECHOCARDIOGRAM;  Surgeon: Purcell Nails, MD;  Location: [facility] OR;  Service: Open Heart Surgery;  Laterality: N/A;  . Left and right heart catheterization with coronary angiogram N/A 08/01/2013    Procedure: LEFT AND RIGHT HEART CATHETERIZATION WITH CORONARY ANGIOGRAM;  Surgeon: Donato Schultz, MD;  Location: [facility] CATH LAB;  Service: Cardiovascular;  Laterality: N/A;    Current Outpatient Prescriptions  Medication Sig Dispense Refill  . amoxicillin (AMOXIL) 500 MG capsule TAKE FOUR CAPSULES BY MOUTH ONE HOUR BEFORE DENTAL APPOINTMENT  0  . aspirin EC 325 MG EC tablet Take 1 tablet (325  mg total) by mouth daily.    . benazepril (LOTENSIN) 10 MG tablet Take 1 tablet (10 mg total) by mouth daily. 90 tablet 3  . fluticasone (FLONASE) 50 MCG/ACT nasal spray Place 2 sprays into the nose daily as needed for allergies.     . hydrochlorothiazide (HYDRODIURIL) 25 MG tablet Take 1 tablet (25 mg total) by mouth daily. 1/2 tablet per day 45 tablet 3  . loratadine (CLARITIN) 10 MG tablet Take 10 mg by mouth daily.    . Multiple Vitamins-Minerals (MENS MULTIVITAMIN PLUS PO) Take 1 tablet by mouth daily.    . Omega-3 Fatty Acids (FISH OIL) 500 MG CAPS Take by mouth daily.    . simvastatin (ZOCOR) 20 MG tablet TAKE 1 TABLET BY MOUTH EVERY DAY AT 6 PM 90  tablet 1   No current facility-administered medications for this visit.    Allergies:   No Known Allergies  Social History:  The patient  reports that he quit smoking about 23 years ago. He has quit using smokeless tobacco. He reports that he drinks about 0.6 oz of alcohol per week. He reports that he does not use illicit drugs.   ROS:  Please see the history of present illness.   Denies any bleeding, syncope, orthopnea, PND.   PHYSICAL EXAM: VS:  BP 126/84 mmHg  Pulse 74  Ht  (1.753 m)  Wt 207 lb 12.8 oz (94.257 kg)  BMI 30.67 kg/m2  SpO2 98% Well nourished, well developed, in no acute distress HEENT: normal Neck: no JVD Cardiac:  normal S1, S2; RRR; 1/6 SM RUSB, scar noted.  Lungs:  clear to auscultation bilaterally, no wheezing, rhonchi or rales Abd: soft, nontender, no hepatomegaly Ext: no edemaRadial artery intact. Normal capillary refill bilaterally. Normal warmth and digits bilaterally. Normal movement. Skin: warm and dryScar well healing. Neuro: Left leg and left arm seem slightly weaker than right., Trouble with dexterity movements with left hand. CN2-12 intact  EKG: 11/05/14-sinus rhythm, first-degree AV block, heart rate 65, left anterior fascicular block, nonspecific interventricular block.  Postoperative echocardiogram: 11/05/14-normal ejection fraction-normal functioning bioprosthetic aortic valve.  ASSESSMENT AND PLAN:  1. Chest pain-could be chest wall pain. No longer having the sensation with without exertion. Continue to monitor. If symptoms worsen or become more worrisome, further cardiac testing such as stress testing warranted. 2. Left-sided weakness-over the past 6 months. Occasionally feels as though he needs to lift his left leg up to get into the car. Cannot use his left hand with dexterity as he used to. He has been seeing chiropractor. Hurt his back about 6 months ago while helping his daughter move. I'm concerned about the possibility of old stroke. I  will refer him to neurology. 3. Status post aortic valve replacement- Dr. Cornelius Moras. Prior bicuspid valve. Last note reviewed. Overall doing well.  Echocardiogram reassuring. Dental prophylaxis. 4. Coronary artery disease-LIMA to LAD. Chest wall pain noted as above, somewhat atypical. Continue with secondary prevention. When discussing possible echocardiogram or stress test, he states that he does not wish to proceed with this because of insurance cost. 5. Hypertriglyceridemia-continue with diet, exercise, simvastatin. Checking lipid panel. LDL 62. 6. Hypertension-essential- Continue to monitor at home. He has 2 wrist blood pressure cuffs. At this point, I will decrease/stopped his metoprolol because of his fatigue symptoms. But see if this helps. He is most 2 years post surgery. This seems reasonable. He also had a first-degree AV block on EKG. 7. Since Dr. freed has retired, he is not  established with new primary doctor. I recommend [employer]. Please call office.  8. 6 month followup  Signed, Donato Schultz, MD [facility]  10/28/2015 8:56 AM

## 2015-11-11 ENCOUNTER — Ambulatory Visit (INDEPENDENT_AMBULATORY_CARE_PROVIDER_SITE_OTHER): Payer: BLUE CROSS/BLUE SHIELD | Admitting: Neurology

## 2015-11-11 ENCOUNTER — Encounter: Payer: Self-pay | Admitting: Neurology

## 2015-11-11 VITALS — BP 120/78 | HR 74 | Ht 69.0 in | Wt 207.2 lb

## 2015-11-11 DIAGNOSIS — G8114 Spastic hemiplegia affecting left nondominant side: Secondary | ICD-10-CM | POA: Diagnosis not present

## 2015-11-11 MED ORDER — BACLOFEN 10 MG PO TABS: 10.0000 mg | ORAL_TABLET | Freq: Two times a day (BID) | ORAL | Status: DC

## 2015-11-11 NOTE — Patient Instructions (Addendum)
1.  Start baclofen 10 mg tablets    Morning       Afternoon        Evening  Day 1-5 1/2 tab                                1/2 tab              Day 6-10 1/2 tab                   1 tab              Continue 1 tab                   1 tab                Stay at the lower dose, if it makes you sleepy.  2.  Please contact my office if you would like to proceed with imaging the brain and/or neck region  3.  Return to clinic as needed   Stroke is a medical emergency.  Know these warning signs of stroke. Every second counts:  Sudden numbness or weakness of the face, arm or leg, especially on one side of the body,  Sudden confusion, trouble speaking or understanding,  Sudden trouble seeing in one eye or both eyes,  Sudden trouble walking, dizziness, loss of balance or coordination,  Sudden severe headache with no known cause.  If you or someone with you has one or more of these signs, don't delay!  Immediately call 9-1-1, or the emergency medical services (EMS) number so an ambulance (ideally with advanced life support) can be sent for you.  Also, check the time so that you will know when the symptoms first appeared. It's very important to take immediate action. Medical treatment may be available if action is taken early enough.

## 2015-11-11 NOTE — Progress Notes (Signed)
[facility] Neurology [facility] Clinic Note - Initial Visit   Date: 11/11/2015  Jeffery [name] MRN: [id number] DOB: 07-Aug-1956   Dear Dr. Anne Fu:  Thank you for your kind referral of Jeffery [name] for consultation of left sided weakness. Although his history is well known to you, please allow [place] to reiterate it for the purpose of our medical record. The patient was accompanied to the clinic by self.   History of Present Illness: Jeffery [name] is a 60 y.o. right-handed Caucasian male with hypertension, hyperlipidemia, and s/p AVR with bioprosthetic valve and CABG (2014), history of left amaurosis fugax (prior to AVR) presenting for evaluation of left sided weakness.    Starting early 2016, he began noticing gradual onset of left arm and leg weakness because when getting into his truck, he needed to use his hand to pick the leg into the truck.  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  He avoid lifting and carrying objects in the hand to minimize dropping it.  He denies any numbness and tingling.  He endorses significant neck and low back pain and muscle cramps of the left leg.  He has been seeing a chiropractor for several years with high velocity manipulation to the neck and back.  He denies urinary/bowel incontinence, but endorses urgency.     He does not have any weakness on the right arm or leg.    Out-side paper records, electronic medical record, and images have been reviewed where available and summarized as:  Lab Results  Component Value Date   CHOL 146 06/04/2015   HDL 33* 06/04/2015   LDLCALC 62 06/04/2015   TRIG 257* 06/04/2015   CHOLHDL 4.4 06/04/2015     Lab Results  Component Value Date   HGBA1C 5.4 08/18/2013    Past Medical History  Diagnosis Date  . Calculus of gallbladder   . Cholecystitis   . Osteoarthritis   . Hypertension   . Aortic stenosis   . Hypertriglyceridemia   .  Bicuspid aortic valve   . Amaurosis fugax   . Chest pain on exertion   . Exertional shortness of breath   . Amaurosis fugax 2012  . Incidental pulmonary nodule, > 3mm and < 8mm 07/23/2013    5 mm L lung nodule and 4 mm R lung nodule seen incidentally on chest CT  . Coronary artery disease 08/01/2013    LAD stenosis  . Dysrhythmia   . Heart murmur   . CHF (congestive heart failure) (HCC)   . S/P aortic valve replacement with bioprosthetic valve 08/20/2013    25 mm [facility] Ease bovine pericardial tissue valve  . S/P CABG x 1 08/20/2013    LIMA to LAD    Past Surgical History  Procedure Laterality Date  . Umbillical hernia    . Cholecystectomy    . Nose surgery    . Aortic valve replacement N/A 08/20/2013    Procedure: AORTIC VALVE REPLACEMENT (AVR);  Surgeon: Purcell Nails, MD;  Location: [facility] OR;  Service: Open Heart Surgery;  Laterality: N/A;  . Coronary artery bypass graft N/A 08/20/2013    Procedure: CORONARY ARTERY BYPASS GRAFTING (CABG);  Surgeon: Purcell Nails, MD;  Location: [facility] OR;  Service: Open Heart Surgery;  Laterality: N/A;  CABG x one, using left internal mammary artery  . Intraoperative transesophageal echocardiogram N/A 08/20/2013    Procedure: INTRAOPERATIVE TRANSESOPHAGEAL ECHOCARDIOGRAM;  Surgeon: Salvatore Decent  Cornelius Moras, MD;  Location: [facility] OR;  Service: Open Heart Surgery;  Laterality: N/A;  . Left and right heart catheterization with coronary angiogram N/A 08/01/2013    Procedure: LEFT AND RIGHT HEART CATHETERIZATION WITH CORONARY ANGIOGRAM;  Surgeon: Donato Schultz, MD;  Location: [facility] CATH LAB;  Service: Cardiovascular;  Laterality: N/A;     Medications:  Outpatient Encounter Prescriptions as of 11/11/2015  Medication Sig  . amoxicillin (AMOXIL) 500 MG capsule TAKE FOUR CAPSULES BY MOUTH ONE HOUR BEFORE DENTAL APPOINTMENT  . aspirin EC 325 MG EC tablet Take 1 tablet (325 mg total) by mouth daily.  . benazepril (LOTENSIN) 10 MG tablet Take 1 tablet (10 mg total) by  mouth daily.  . fluticasone (FLONASE) 50 MCG/ACT nasal spray Place 2 sprays into the nose daily as needed for allergies.   . hydrochlorothiazide (HYDRODIURIL) 25 MG tablet Take 1 tablet (25 mg total) by mouth daily. 1/2 tablet per day  . loratadine (CLARITIN) 10 MG tablet Take 10 mg by mouth daily.  . Multiple Vitamins-Minerals (MENS MULTIVITAMIN PLUS PO) Take 1 tablet by mouth daily.  . Omega-3 Fatty Acids (FISH OIL) 500 MG CAPS Take by mouth daily.  . simvastatin (ZOCOR) 20 MG tablet TAKE 1 TABLET BY MOUTH EVERY DAY AT 6 PM   No facility-administered encounter medications on file as of 11/11/2015.     Allergies: No Known Allergies  Family History: Family History  Problem Relation Age of Onset  . CAD Father     MI  . Heart attack Father 72  . Alzheimer's disease Mother 3  . Arthritis-Osteo Brother   . Allergic rhinitis Sister   . Colon polyps Daughter   . Heart attack Paternal Uncle   . Other Paternal Grandfather     Aortic Valve disorder  . Heart attack Paternal Uncle   . Heart attack Paternal Uncle   . Healthy Daughter     Social History: Social History  Substance Use Topics  . Smoking status: Former Smoker    Quit date: 10/09/1992  . Smokeless tobacco: Former Neurosurgeon     Comment: chewed on cigars  . Alcohol Use: 0.6 oz/week    1 Cans of beer per week     Comment: 1 beer a month   Social History   Social History Narrative   Lives with wife and daughter in a one story home.  Has 2 daughters.     Works as an Personnel officer.  Education: high school.    Review of Systems:  CONSTITUTIONAL: No fevers, chills, night sweats, or weight loss.   EYES: No visual changes or eye pain ENT: No hearing changes.  No history of nose bleeds.   RESPIRATORY: No cough, wheezing and shortness of breath.   CARDIOVASCULAR: Negative for chest pain, and palpitations.   GI: Negative for abdominal discomfort, blood in stools or black stools.  No recent change in bowel habits.   GU:  No history  of incontinence.   MUSCLOSKELETAL: +history of joint pain or swelling.  No myalgias.   SKIN: Negative for lesions, rash, and itching.   HEMATOLOGY/ONCOLOGY: Negative for prolonged bleeding, bruising easily, and swollen nodes.  No history of cancer.   ENDOCRINE: Negative for cold or heat intolerance, polydipsia or goiter.   PSYCH:  No depression or anxiety symptoms.   NEURO: As Above.   Vital Signs:  BP 120/78 mmHg  Pulse 74  Ht 5\' 9"  (1.753 m)  Wt 207 lb 3 oz (93.98 kg)  BMI 30.58 kg/m2  SpO2  98%   General Medical Exam:   General:  Well appearing, comfortable.   Eyes/ENT: see cranial nerve examination.   Neck: No masses appreciated.  Full range of motion without tenderness.  No carotid bruits. Respiratory:  Clear to auscultation, good air entry bilaterally.   Cardiac:  Regular rate and rhythm, no murmur.   Extremities:  No deformities, edema, or skin discoloration.  Skin:  No rashes or lesions.  Neurological Exam: MENTAL STATUS including orientation to time, place, person, recent and remote memory, attention span and concentration, language, and fund of knowledge is normal.  Speech is not dysarthric.  CRANIAL NERVES: II:  No visual field defects.  Unremarkable fundi.   III-IV-VI: Pupils equal round and reactive to light.  Normal conjugate, extra-ocular eye movements in all directions of gaze.  No nystagmus.  No ptosis.   V:  Normal facial sensation.  Jaw jerk is present.   VII:  Mild flattening of the nasolabial fold on the left and smile asymmetry.    VIII:  Normal hearing and vestibular function.   IX-X:  Normal palatal movement.   XI:  Normal shoulder shrug and head rotation.   XII:  Normal tongue strength and range of motion, no deviation or fasciculation.  MOTOR:  No atrophy, fasciculations.  Mild left hand intention tremor. No pronator drift.      Right Upper Extremity:    Left Upper Extremity:    Deltoid  5/5   Deltoid  5/5   Biceps  5/5   Biceps  5/5   Triceps  5/5    Triceps  5/5   Wrist extensors  5/5   Wrist extensors  5/5   Wrist flexors  5/5   Wrist flexors  5/5   Finger extensors  5/5   Finger extensors  5/5   Finger flexors  5/5   Finger flexors  5/5   Dorsal interossei  5/5   Dorsal interossei  5-/5  Abductor pollicis  5/5   Abductor pollicis  5/5   Tone (Ashworth scale)  0  Tone (Ashworth scale)  0+   Right Lower Extremity:    Left Lower Extremity:    Hip flexors  5/5   Hip flexors  5/5   Hip extensors  5/5   Hip extensors  5/5   Knee flexors  5/5   Knee flexors  5/5   Knee extensors  5/5   Knee extensors  5/5   Dorsiflexors  5/5   Dorsiflexors  5/5   Plantarflexors  5/5   Plantarflexors  5/5   Toe extensors  5/5   Toe extensors  5/5   Toe flexors  5/5   Toe flexors  5/5   Tone (Ashworth scale)  0  Tone (Ashworth scale)  1   MSRs:  Right                                                                 Left brachioradialis 2+  brachioradialis 3+  biceps 2+  biceps 3+  triceps 2+  triceps 3+  patellar 2+  patellar 3+  ankle jerk 2+  ankle jerk 2+  Hoffman no  Hoffman no  plantar response down  plantar response down  Crossed adductors and medial pectoralis reflexes are present on  the left.  SENSORY:  Normal and symmetric perception of light touch, pinprick, vibration, and proprioception.  Romberg's sign absent.   COORDINATION/GAIT:  Slowed finger tapping on the left.  No dysmetria.  Gait shows spastic left leg with circumduction.   IMPRESSION: Jeffery [name] is a 60 year-old gentleman referred for evaluation of left sided weakness.  His exam shows left spastic paresis with spastic gait.  He most likely did have an old stroke which is manifesting with spasticity now, alternatively, he has chronic neck pain and may also have cervical canal stenosis, but his lateralizing findings are more suggestive of an ischemic central event.  I would like to obtain imaging of the brain, but patient does not want any costly testing to be done.  I offered CT  head which would be cheaper and still show a remote stroke, but he declined this as well. US carotids was also declined.  I had a length discussion regarding the diagnostic possibilities and how testing could change the management of each (stroke work-up if ischemic, surgery if severe myelopathy from cervical spinal stenosis) and he expresses understanding of the risks of not doing any diagnostic work-up. He takes a daily aspirin and is on statin and blood pressure therapy for secondary prevention.  For his spasticity, I offered baclofen  to be titrated to  twice daily, which he is agreeable with.  PT was also declined.  Stroke warning signs discussed.  Follow-up visit offered.  Return to clinic as needed   The duration of this appointment visit was 45 minutes of face-to-face time with the patient.  Greater than 50% of this time was spent in counseling, explanation of diagnosis, planning of further management, and coordination of care.   Thank you for allowing me to participate in patient's care.  If I can answer any additional questions, I would be pleased to do so.    Sincerely,    Tamarick Kovalcik K. Allena Katz, DO

## 2015-11-12 ENCOUNTER — Other Ambulatory Visit: Payer: Self-pay | Admitting: Cardiology

## 2016-04-04 ENCOUNTER — Other Ambulatory Visit: Payer: Self-pay | Admitting: *Deleted

## 2016-04-04 ENCOUNTER — Telehealth: Payer: Self-pay | Admitting: Neurology

## 2016-04-04 DIAGNOSIS — G8114 Spastic hemiplegia affecting left nondominant side: Secondary | ICD-10-CM

## 2016-04-04 NOTE — Telephone Encounter (Signed)
Jeffery DolphinJerrell Gaffin 11-01-2055. His wife called and said he has decided to have the MRI done. [facility] and they said they felt he should have the MRI done to see if he does have Parkinson's. His wife's number is [phone]. Thank you

## 2016-04-04 NOTE — Telephone Encounter (Signed)
MRI brain with and with out contrast

## 2016-04-04 NOTE — Telephone Encounter (Signed)
MRI brain?  With/without?

## 2016-04-04 NOTE — Telephone Encounter (Signed)
Order placed

## 2016-04-13 ENCOUNTER — Ambulatory Visit
Admission: RE | Admit: 2016-04-13 | Discharge: 2016-04-13 | Disposition: A | Discharge status: Home / Self Care | Payer: BLUE CROSS/BLUE SHIELD | Source: Ambulatory Visit | Attending: Neurology | Admitting: Neurology

## 2016-04-13 DIAGNOSIS — G8114 Spastic hemiplegia affecting left nondominant side: Secondary | ICD-10-CM

## 2016-04-13 MED ORDER — GADOBENATE DIMEGLUMINE 529 MG/ML IV SOLN
19.0000 mL | Freq: Once | INTRAVENOUS | Status: AC | PRN
  Administered 2016-04-13: 19 mL via INTRAVENOUS

## 2016-04-14 ENCOUNTER — Telehealth: Payer: Self-pay | Admitting: Neurology

## 2016-04-14 NOTE — Telephone Encounter (Signed)
Called patient with the results of his MRI brain which does not show any infarct or clear explanation for his left sided weakness.  There is susceptibility along the RIGHT posterior frontal convexity, which may represent cavernoma or old hemorrhage.  Surprisingly his FLAIR and T2 sequence is normal.  CT head recommended to look for calcification vs old hemorrhage, but patient does not wish to do any additional testing. Although his left sided weakness may correlate with this right frontal area of abnormality, I'm not sure if it explains all of his weakness.  Next step would be MRI cervical spine, but again, patient does not wish to do any testing.  I offered a return visit to reassess his symptoms, which he was agreeable to.  [name] [name], DO

## 2016-04-20 ENCOUNTER — Ambulatory Visit: Payer: BLUE CROSS/BLUE SHIELD | Admitting: Neurology

## 2016-04-20 ENCOUNTER — Encounter: Payer: Self-pay | Admitting: Neurology

## 2016-04-20 ENCOUNTER — Ambulatory Visit (INDEPENDENT_AMBULATORY_CARE_PROVIDER_SITE_OTHER): Payer: BLUE CROSS/BLUE SHIELD | Admitting: Neurology

## 2016-04-20 VITALS — BP 118/80 | HR 76 | Ht 69.0 in | Wt 202.2 lb

## 2016-04-20 DIAGNOSIS — G8114 Spastic hemiplegia affecting left nondominant side: Secondary | ICD-10-CM

## 2016-04-20 DIAGNOSIS — R29810 Facial weakness: Secondary | ICD-10-CM

## 2016-04-20 DIAGNOSIS — R292 Abnormal reflex: Secondary | ICD-10-CM

## 2016-04-20 DIAGNOSIS — R258 Other abnormal involuntary movements: Secondary | ICD-10-CM

## 2016-04-20 MED ORDER — BACLOFEN 10 MG PO TABS: 10.0000 mg | ORAL_TABLET | Freq: Three times a day (TID) | ORAL | Status: DC

## 2016-04-20 NOTE — Progress Notes (Signed)
Follow-up Visit   Date: 04/20/2016    Jeffery [name] MRN: [id number] DOB: 09-10-1956   Interim History: Jeffery [name] is a 60 y.o. right-handed Caucasian male with hypertension, hyperlipidemia, and s/p AVR with bioprosthetic valve and CABG (2014), history of left amaurosis fugax (prior to AVR) returning to the clinic for follow-up of left sided spastic hemiparesis.  The patient was accompanied to the clinic by self.  History of present illness:  Starting early 2016, he began noticing gradual onset of left arm and leg weakness because when getting into his truck, he needed to use his hand to pick the leg into the truck. 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 He avoids lifting and carrying objects in the hand to minimize dropping it. He denies any numbness and tingling. He endorses significant neck and low back pain and muscle cramps of the left leg. He has been seeing a chiropractor for several years with high velocity manipulation to the neck and back. He denies urinary/bowel incontinence, but endorses urgency.   He does not have any weakness on the right arm or leg.   UPDATE 04/20/2016:  Patient was very reluctant to have any testing at his last visit, but eventually did agree to having imaging of his brain which showed SWI changes over the right posterior frontal convexity and right cerebellum. He denies any history of head trauma or bleed.    He feels that his left hand is getting more nonfunctional because it is too stiff for him to move.  His strength is intact, but ability to use the left arm and leg is much more difficulty.  He is still working as an Personnel officer and somehow manages to continue to do his work.  His wife was concerned about parkinson's disease because of his left hand jerky movements.  He has a strong family history of parkinson's disease.   He also reports to having frequent night terrors.     Medications:  Current Outpatient Prescriptions on File Prior to Visit  Medication Sig Dispense Refill  . aspirin EC 325 MG EC tablet Take 1 tablet (325 mg total) by mouth daily.    . baclofen (LIORESAL) 10 MG tablet Take 1 tablet (10 mg total) by mouth 2 (two) times daily. 60 each 5  . benazepril (LOTENSIN) 10 MG tablet TAKE 1 TABLET (10 MG TOTAL) BY MOUTH DAILY. 90 tablet 3  . fluticasone (FLONASE) 50 MCG/ACT nasal spray Place 2 sprays into the nose daily as needed for allergies.     . hydrochlorothiazide (HYDRODIURIL) 25 MG tablet Take 1 tablet (25 mg total) by mouth daily. 1/2 tablet per day 45 tablet 3  . loratadine (CLARITIN) 10 MG tablet Take 10 mg by mouth daily.    . Multiple Vitamins-Minerals (MENS MULTIVITAMIN PLUS PO) Take 1 tablet by mouth daily.    . Omega-3 Fatty Acids (FISH OIL) 500 MG CAPS Take by mouth daily.    . simvastatin (ZOCOR) 20 MG tablet TAKE 1 TABLET BY MOUTH EVERY DAY AT 6 PM 90 tablet 1   No current facility-administered medications on file prior to visit.    Allergies: No Known Allergies  Review of Systems:  CONSTITUTIONAL: No fevers, chills, night sweats, or weight loss.  EYES: No visual changes or eye pain ENT: No hearing changes.  No history of nose bleeds.   RESPIRATORY: No cough, wheezing and shortness of breath.   CARDIOVASCULAR: Negative  for chest pain, and palpitations.   GI: Negative for abdominal discomfort, blood in stools or black stools.  No recent change in bowel habits.   GU:  No history of incontinence.   MUSCLOSKELETAL: No history of joint pain or swelling.  No myalgias.   SKIN: Negative for lesions, rash, and itching.   ENDOCRINE: Negative for cold or heat intolerance, polydipsia or goiter.   PSYCH:  No depression or anxiety symptoms.   NEURO: As Above.   Vital Signs:  BP 118/80 mmHg  Pulse 76  Ht 5\' 9"  (1.753 m)  Wt 202 lb 3 oz (91.712 kg)  BMI 29.84 kg/m2  SpO2 97%   Neurological Exam: MENTAL STATUS including orientation  to time, place, person, recent and remote memory, attention span and concentration, language, and fund of knowledge is normal.  Speech is not dysarthric.  CRANIAL NERVES:  No visual field defects.  Pupils equal round and reactive to light.  Normal conjugate, extra-ocular eye movements in all directions of gaze.  No ptosis. Normal facial sensation.  Mild left facial droop with smile and asymmetric with puffing his cheeks on the left. Palate elevates symmetrically.  Tongue is midline.  MOTOR:  Motor strength is 5/5 in all extremities, proximally and distally.  No atrophy or fasciculations.  There is occasional left hand tremor which are triggered by movement and most suggestive of hand clonus.   No pronator drift.  There is marked spastic tone involving the left upper and lower extremity.    MSRs:  Reflexes are 2+/4 on the right side and 3+/4 on the left side with vertical spread.  Crossed adductors are present on the left only.  Plantar response is extensor on the right.    SENSORY:  Intact to to pin prick, vibration, and temperature.  COORDINATION/GAIT:  Reduced finger tapping and heel tapping on the left side.  Gait narrow based and stable, there is mild circumduction of the left leg.   Data: MRI brain wwo contrast 04/13/2016: Focal area of strong susceptibility along the RIGHT posterior frontal convexity near the region for the motor strip of the LEFT upper extremity without corresponding intra-axial signal abnormality or postcontrast enhancement. Similar smaller intra-axial area of susceptibility RIGHT cerebellum.  Superficial cavernoma is possible, although would be atypical. Sequelae of prior head trauma remain a consideration. Prior embolic infarctions with hemorrhage are unlikely, given lack of brain substance loss. Extra-axial dural or skull based mass is unlikely but not excluded. If further investigation desired, consider CT head without contrast.   IMPRESSION/PLAN: Mr. Jeffery [name] is a 60  year-old gentleman returning for evaluation of left sided spasticity.  His exam today also shows mild right facial weakness.  There is no weakness in the arms or legs.  He continues to have marked hyperreflexia and spasticity of the left arm and leg.  MRI brain was personally reviewed with patient and does not show any infarct, but there susceptibility along the RIGHT posterior frontal convexity, which may represent cavernoma or old hemorrhage. Surprisingly his FLAIR and T2 sequence is normal. The location of the lesion can certainly present with motor deficits on the left, but I would expect more weakness which he does not have.  CT head recommended to look for calcification vs old hemorrhage.  Next step would be MRI cervical spine to look for myelopathy.   PLAN/RECOMMENDATIONS:  1.  CT head without contrast 2.  MRI cervical spine with and without contrast 3.  Increase baclofen 10mg  three times daily  Further recommendations  will be based on the results of the above testing   The duration of this appointment visit was 40 minutes of face-to-face time with the patient.  Greater than 50% of this time was spent in counseling, explanation of diagnosis, planning of further management, and coordination of care.   Thank you for allowing me to participate in patient's care.  If I can answer any additional questions, I would be pleased to do so.    Sincerely,    Teleshia Lemere K. Allena Katz, DO

## 2016-04-20 NOTE — Progress Notes (Signed)
Note routed

## 2016-04-20 NOTE — Patient Instructions (Addendum)
1.  CT head without contrast 2.  MRI cervical spine with and without contrast 3.  Increase baclofen 10mg  three times daily  We will call you with the results of your testing

## 2016-04-25 ENCOUNTER — Telehealth: Payer: Self-pay | Admitting: Neurology

## 2016-04-25 NOTE — Telephone Encounter (Signed)
CT head without contrast and MRI cervical spine with and without contrast has been approved. Authorization # [id number], valid 7/18 through 05/24/2016 at [facility].

## 2016-05-01 ENCOUNTER — Ambulatory Visit
Admission: RE | Admit: 2016-05-01 | Discharge: 2016-05-01 | Disposition: A | Discharge status: Home / Self Care | Payer: BLUE CROSS/BLUE SHIELD | Source: Ambulatory Visit | Attending: Neurology | Admitting: Neurology

## 2016-05-01 DIAGNOSIS — G8114 Spastic hemiplegia affecting left nondominant side: Secondary | ICD-10-CM

## 2016-05-01 DIAGNOSIS — R258 Other abnormal involuntary movements: Secondary | ICD-10-CM

## 2016-05-01 DIAGNOSIS — R29810 Facial weakness: Secondary | ICD-10-CM

## 2016-05-01 DIAGNOSIS — R292 Abnormal reflex: Secondary | ICD-10-CM

## 2016-05-01 MED ORDER — GADOBENATE DIMEGLUMINE 529 MG/ML IV SOLN
19.0000 mL | Freq: Once | INTRAVENOUS | Status: AC | PRN
  Administered 2016-05-01: 19 mL via INTRAVENOUS

## 2016-05-05 ENCOUNTER — Other Ambulatory Visit: Payer: Self-pay | Admitting: Neurology

## 2016-05-05 ENCOUNTER — Telehealth: Payer: Self-pay | Admitting: Neurology

## 2016-05-05 ENCOUNTER — Other Ambulatory Visit (INDEPENDENT_AMBULATORY_CARE_PROVIDER_SITE_OTHER): Payer: BLUE CROSS/BLUE SHIELD

## 2016-05-05 ENCOUNTER — Other Ambulatory Visit: Payer: Self-pay | Admitting: *Deleted

## 2016-05-05 DIAGNOSIS — G8190 Hemiplegia, unspecified affecting unspecified side: Secondary | ICD-10-CM | POA: Diagnosis not present

## 2016-05-05 DIAGNOSIS — G959 Disease of spinal cord, unspecified: Secondary | ICD-10-CM | POA: Diagnosis not present

## 2016-05-05 LAB — COMPREHENSIVE METABOLIC PANEL
ALT: 33 U/L (ref 0–53)
AST: 24 U/L (ref 0–37)
Albumin: 4.7 g/dL (ref 3.5–5.2)
Alkaline Phosphatase: 53 U/L (ref 39–117)
BUN: 20 mg/dL (ref 6–23)
CO2: 28 mEq/L (ref 19–32)
Calcium: 9.9 mg/dL (ref 8.4–10.5)
Chloride: 104 mEq/L (ref 96–112)
Creatinine, Ser: 1.09 mg/dL (ref 0.40–1.50)
GFR: 73.84 mL/min (ref >60.00)
Glucose, Bld: 79 mg/dL (ref 70–99)
Potassium: 3.8 mEq/L (ref 3.5–5.1)
Sodium: 138 mEq/L (ref 135–145)
Total Bilirubin: 0.8 mg/dL (ref 0.2–1.2)
Total Protein: 7.6 g/dL (ref 6.0–8.3)

## 2016-05-05 LAB — CBC
HEMATOCRIT: 44.7 % (ref 39.0–52.0)
Hemoglobin: 15.7 g/dL (ref 13.0–17.0)
MCHC: 35.2 g/dL (ref 30.0–36.0)
MCV: 85.2 fl (ref 78.0–100.0)
Platelets: 219 10*3/uL (ref 150.0–400.0)
RBC: 5.25 Mil/uL (ref 4.22–5.81)
RDW: 12.6 % (ref 11.5–15.5)
WBC: 5.4 10*3/uL (ref 4.0–10.5)

## 2016-05-05 LAB — CK: Total CK: 142 U/L (ref 7–232)

## 2016-05-05 LAB — SEDIMENTATION RATE: Sed Rate: 7 mm/hr (ref 0–20)

## 2016-05-05 LAB — C-REACTIVE PROTEIN: CRP: 0.1 mg/dL — ABNORMAL LOW (ref 0.5–20.0)

## 2016-05-05 LAB — VITAMIN B12: Vitamin B-12: 413 pg/mL (ref 211–911)

## 2016-05-05 NOTE — Telephone Encounter (Signed)
Orders placed for labs

## 2016-05-05 NOTE — Telephone Encounter (Signed)
Results of MRI cervical spine discussed with patient which does not show signs of severe canal stenosis to explain the severity of his left sided spasticity.    We will need to check labs for other causes of myelopathy.  Caryl Pina - please order CBC, CMP, vitamin B12, MMA, copper, vitamin E, ACE, ESR, CRP, GAD65 antibody, SPEP with IFE, PTH, CK, aldolase.  He will come by the office today to pick the orders up.  Thanks.  Camillo Quadros K. Posey Pronto, DO

## 2016-05-08 LAB — PARATHYROID HORMONE, INTACT (NO CA): PTH: 32 pg/mL (ref 14–64)

## 2016-05-08 LAB — COPPER, SERUM: COPPER: 100 ug/dL (ref 72–166)

## 2016-05-08 LAB — ANGIOTENSIN CONVERTING ENZYME: Angiotensin-Converting Enzyme: 7 U/L — ABNORMAL LOW (ref 8–52)

## 2016-05-08 LAB — ALDOLASE: ALDOLASE: 4.5 U/L (ref <=8.1)

## 2016-05-08 LAB — GLUTAMIC ACID DECARBOXYLASE AUTO ABS

## 2016-05-09 LAB — PROTEIN ELECTROPHORESIS, SERUM
ALBUMIN ELP: 4.3 g/dL (ref 3.8–4.8)
ALPHA-1-GLOBULIN: 0.3 g/dL (ref 0.2–0.3)
ALPHA-2-GLOBULIN: 0.6 g/dL (ref 0.5–0.9)
BETA GLOBULIN: 0.4 g/dL (ref 0.4–0.6)
Beta 2: 0.3 g/dL (ref 0.2–0.5)
Gamma Globulin: 1.2 g/dL (ref 0.8–1.7)
Total Protein, Serum Electrophoresis: 7.1 g/dL (ref 6.1–8.1)

## 2016-05-09 LAB — VITAMIN E
GAMMA-TOCOPHEROL (VIT E): 1 mg/L (ref <=4.3)
Vitamin E (Alpha Tocopherol): 15.3 mg/L (ref 5.7–19.9)

## 2016-05-09 LAB — IMMUNOFIXATION ELECTROPHORESIS
IGM, SERUM: 43 mg/dL — AB (ref 48–271)
IgA: 187 mg/dL (ref 81–463)
IgG (Immunoglobin G), Serum: 1294 mg/dL (ref 694–1618)

## 2016-05-09 LAB — METHYLMALONIC ACID, SERUM: Methylmalonic Acid, Quant: 157 nmol/L (ref 87–318)

## 2016-05-12 NOTE — Progress Notes (Signed)
Left message for patient to call me back. 

## 2016-05-19 ENCOUNTER — Ambulatory Visit (INDEPENDENT_AMBULATORY_CARE_PROVIDER_SITE_OTHER): Payer: BLUE CROSS/BLUE SHIELD | Admitting: Neurology

## 2016-05-19 ENCOUNTER — Encounter: Payer: Self-pay | Admitting: Neurology

## 2016-05-19 VITALS — BP 110/78 | HR 61 | Ht 69.0 in | Wt 205.5 lb

## 2016-05-19 DIAGNOSIS — R292 Abnormal reflex: Secondary | ICD-10-CM

## 2016-05-19 DIAGNOSIS — G8114 Spastic hemiplegia affecting left nondominant side: Secondary | ICD-10-CM | POA: Diagnosis not present

## 2016-05-19 DIAGNOSIS — G959 Disease of spinal cord, unspecified: Secondary | ICD-10-CM | POA: Diagnosis not present

## 2016-05-19 MED ORDER — CARBIDOPA-LEVODOPA 25-100 MG PO TABS: 1.0000 | ORAL_TABLET | Freq: Three times a day (TID) | ORAL | 2 refills | Status: DC

## 2016-05-19 MED ORDER — CYCLOBENZAPRINE HCL 5 MG PO TABS: ORAL_TABLET | ORAL | 3 refills | Status: DC

## 2016-05-19 NOTE — Patient Instructions (Addendum)
1.  Start flexeril 5mg  at bedtime for 1 week, then increase to 1 tablet twice daily 2.  [facility] for second opinion

## 2016-05-19 NOTE — Progress Notes (Signed)
Follow-up Visit   Date: 05/19/16    Jeffery [name] MRN: [id number] DOB: 10-Mar-1956   Interim History: Jeffery [name] is a 60 y.o. right-handed Caucasian male with hypertension, hyperlipidemia, and s/p AVR with bioprosthetic valve and CABG (2014), history of left amaurosis fugax (prior to AVR) returning to the clinic for follow-up of left sided spastic hemiparesis.  The patient was accompanied to the clinic by self.  History of present illness:  Starting early 2016, he began noticing gradual onset of left arm and leg weakness because when getting into his truck, he needed to use his hand to pick the leg into the truck. 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 He avoids lifting and carrying objects in the hand to minimize dropping it. He denies any numbness and tingling. He endorses significant neck and low back pain and muscle cramps of the left leg. He has been seeing a chiropractor for several years with high velocity manipulation to the neck and back. He denies urinary/bowel incontinence, but endorses urgency. He does not have any weakness on the right arm or leg.   UPDATE 04/20/2016:  Patient was very reluctant to have any testing at his last visit, but eventually did agree to having imaging of his brain which showed SWI changes over the right posterior frontal convexity and right cerebellum. He denies any history of head trauma or bleed.    He feels that his left hand is getting more nonfunctional because it is too stiff for him to move.  His strength is intact, but ability to use the left arm and leg is much more difficulty.  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  His wife was concerned about parkinson's disease because of his left hand jerky movements.  He has a strong family history of parkinson's disease.   He also reports to having frequent night terrors.     UPDATE 05/19/2016:  Patient is here to discuss the results of his labs, CT head, and MRI cervicals spine.  Myelopathy labs, including GAD65 returned normal.  There is no evidence of compressive myelopathy.  He has noticed new weakness of the left arm, stating that he was unable to lift 50lb bag of concrete yesterday on his truck, which usually would be easy.  He continues to have great difficulty with fine finger movements on the left.  For the past few days, he started having cramps in the right posterior cramp.  No muscle twitches, shortness of breath, dysphagia, or dysarthria.   Medications:  Current Outpatient Prescriptions on File Prior to Visit  Medication Sig Dispense Refill  . aspirin EC 325 MG EC tablet Take 1 tablet (325 mg total) by mouth daily. (Patient not taking: Reported on 05/19/2016)    . benazepril (LOTENSIN) 10 MG tablet TAKE 1 TABLET (10 MG TOTAL) BY MOUTH DAILY. (Patient not taking: Reported on 05/19/2016) 90 tablet 3  . fluticasone (FLONASE) 50 MCG/ACT nasal spray Place 2 sprays into the nose daily as needed for allergies.     . hydrochlorothiazide (HYDRODIURIL) 25 MG tablet Take 1 tablet (25 mg total) by mouth daily. 1/2 tablet per day (Patient not taking: Reported on 05/19/2016) 45 tablet 3  . loratadine (CLARITIN) 10 MG tablet Take 10 mg by mouth daily.    . Multiple Vitamins-Minerals (MENS MULTIVITAMIN PLUS PO) Take 1 tablet by mouth daily.    Marland Kitchen  Omega-3 Fatty Acids (FISH OIL) 500 MG CAPS Take by mouth daily.    . simvastatin (ZOCOR) 20 MG tablet TAKE 1 TABLET BY MOUTH EVERY DAY AT 6 PM (Patient not taking: Reported on 05/19/2016) 90 tablet 1   No current facility-administered medications on file prior to visit.     Allergies: No Known Allergies  Review of Systems:  CONSTITUTIONAL: No fevers, chills, night sweats, or weight loss.  EYES: No visual changes or eye pain ENT: No hearing changes.  No history of nose bleeds.   RESPIRATORY: No cough, wheezing and shortness of  breath.   CARDIOVASCULAR: Negative for chest pain, and palpitations.   GI: Negative for abdominal discomfort, blood in stools or black stools.  No recent change in bowel habits.   GU:  No history of incontinence.   MUSCLOSKELETAL: No history of joint pain or swelling.  No myalgias.   SKIN: Negative for lesions, rash, and itching.   ENDOCRINE: Negative for cold or heat intolerance, polydipsia or goiter.   PSYCH:  No depression or anxiety symptoms.   NEURO: As Above.   Vital Signs:  BP 110/78   Pulse 61   Ht 5' 9" (1.753 m)   Wt 205 lb 8 oz (93.2 kg)   SpO2 98%   BMI 30.35 kg/m    Neurological Exam: MENTAL STATUS including orientation to time, place, person, recent and remote memory, attention span and concentration, language, and fund of knowledge is normal.  Speech is not dysarthric.  CRANIAL NERVES:  Pupils equal round and reactive to light.  Normal conjugate, extra-ocular eye movements in all directions of gaze.  No ptosis.  Mild left facial droop with smile and asymmetric with puffing his cheeks on the left. Palate elevates symmetrically.  Tongue is midline. No tongue fasciculations.  MOTOR:  Motor strength is 5/5 in all extremities, except left finger abductors and ABP is 5-/5 (new).  No atrophy or fasciculations.  There is occasional left hand tremor which are triggered by movement and most suggestive of hand clonus.    There is marked spastic tone involving the left upper and lower extremities.  MSRs:  Reflexes are 2+/4 on the right side and 3+/4 on the left side with vertical spread.  Crossed adductors are present on the left only.  Plantar response is extensor on the right.    COORDINATION/GAIT:  Reduced finger tapping and heel tapping on the left side.  Gait narrow based and stable, there is mild circumduction of the left leg.   Data: MRI brain wwo contrast 04/13/2016: Focal area of strong susceptibility along the RIGHT posterior frontal convexity near the region for the  motor strip of the LEFT upper extremity without corresponding intra-axial signal abnormality or postcontrast enhancement. Similar smaller intra-axial area of susceptibility RIGHT cerebellum.  Superficial cavernoma is possible, although would be atypical. Sequelae of prior head trauma remain a consideration. Prior embolic infarctions with hemorrhage are unlikely, given lack of brain substance loss. Extra-axial dural or skull based mass is unlikely but not excluded. If further investigation desired, consider CT head without contrast.  CT head wwo contrast 05/02/2016:   Chronic changes as described, with no extra-axial lesion demonstrated on noncontrast CT. 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  MRI cervical spine wwo contrast 05/02/2016:   1. Multilevel cervical disc degeneration, worst at C5-6 and C6-7 where there is moderate to severe bilateral neural foraminal stenosis. 2. Mild spinal stenosis at C4-5. No spinal cord compression or  signal abnormality.   IMPRESSION/PLAN: Mr. Caban is a 60 year-old gentleman returning for evaluation of left sided spasticity.  His exam today shows mild left finger abduction and ABP weakness, which is new.  He continues to have marked hyperreflexia and spasticity of the left arm and leg.  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 The location of the lesion can certainly present with motor deficits on the left, but I would expect more weakness which he does not have. MRI cervical is also without compressive myelopathy.  He underwent a battery of testing including CBC, CMP, vitamin B12, MMA, copper, vitamin E, ACE, ESR, CRP, GAD65 antibody, SPEP with IFE, PTH, CK, and aldolase, all which returned normal.    With his prominent upper motor neuron signs and new left hand  weakness, UMN-predominant motor neuron disease is high on the differential. We discussed NCS/EMG to look for lower motor neuron disease, but with his mild weakness, it may be too early to see denervation.  His family is concerned about parkinson's disease and I offered a trial of sinemet, but my overall suspicion is low, so he decided against this.  Although UMN signs are presents in Parkinson's the degree of his spasticity and now hand weakness, does not favor a movement disorder.   For his spasticity, I will discontinue baclofen due to ineffectiveness and start flexeril 73m BID.  Common side effects were discussed.   Understandably, he is frustrated that the work-up has not returned a treatable diagnosis. I have NOT mentioned the possibility of symptoms to evolve into ALS, as I do not have enough clinical findings, namely EMG to support this.  I offered him a second [facility] which he is interested in.  The duration of this appointment visit was 40 minutes of face-to-face time with the patient.  Greater than 50% of this time was spent in counseling, explanation of diagnosis, planning of further management, and coordination of care.   Thank you for allowing me to participate in patient's care.  If I can answer any additional questions, I would be pleased to do so.    Sincerely,    [name] PPosey Pronto DO

## 2016-05-22 NOTE — Progress Notes (Signed)
Patient has appointment on September 25 at 1:00.  Left message informing patient.

## 2016-05-26 ENCOUNTER — Other Ambulatory Visit: Payer: Self-pay | Admitting: Cardiology

## 2016-06-06 ENCOUNTER — Other Ambulatory Visit: Payer: Self-pay | Admitting: Neurology

## 2016-06-06 NOTE — Telephone Encounter (Signed)
Medication refused.  Baclofen d/c'd and changed to flexeril.

## 2016-06-11 ENCOUNTER — Other Ambulatory Visit: Payer: Self-pay | Admitting: Cardiology

## 2016-07-11 ENCOUNTER — Other Ambulatory Visit: Payer: Self-pay | Admitting: Cardiology

## 2016-07-12 ENCOUNTER — Other Ambulatory Visit: Payer: Self-pay | Admitting: *Deleted

## 2016-07-12 MED ORDER — HYDROCHLOROTHIAZIDE 25 MG PO TABS
12.5000 mg | ORAL_TABLET | Freq: Every day | ORAL | 0 refills | Status: DC
Start: 2016-07-12 — End: 2016-09-13

## 2016-09-12 ENCOUNTER — Other Ambulatory Visit: Payer: Self-pay | Admitting: Cardiology

## 2016-09-12 ENCOUNTER — Other Ambulatory Visit: Payer: Self-pay | Admitting: Neurology

## 2016-09-13 ENCOUNTER — Other Ambulatory Visit: Payer: Self-pay | Admitting: Cardiology

## 2016-09-25 ENCOUNTER — Other Ambulatory Visit: Payer: Self-pay | Admitting: Cardiology

## 2016-09-26 NOTE — Telephone Encounter (Signed)
Medication Detail    Disp Refills Start End   simvastatin (ZOCOR) 20 MG tablet 30 tablet 1 09/13/2016    Sig: TAKE 1 TABLET BY MOUTH EVERY DAY AT 6 PM   Notes to Pharmacy: Please call our office to schedule an yearly appointment for January before anymore refills. [phone]. Thank you 1st attempt   E-Prescribing Status: Receipt confirmed by pharmacy (09/13/2016 9:39 AM EST)   Pharmacy   [id number] - [place], [place] - [address]

## 2016-11-14 ENCOUNTER — Other Ambulatory Visit: Payer: Self-pay | Admitting: Cardiology

## 2016-11-29 ENCOUNTER — Other Ambulatory Visit: Payer: Self-pay | Admitting: Cardiology

## 2016-12-28 ENCOUNTER — Other Ambulatory Visit: Payer: Self-pay | Admitting: Cardiology

## 2017-01-09 ENCOUNTER — Other Ambulatory Visit: Payer: Self-pay | Admitting: Neurology

## 2017-01-17 ENCOUNTER — Other Ambulatory Visit: Payer: Self-pay | Admitting: Cardiology

## 2017-02-15 ENCOUNTER — Other Ambulatory Visit: Payer: Self-pay | Admitting: Cardiology

## 2017-02-20 ENCOUNTER — Other Ambulatory Visit: Payer: Self-pay | Admitting: Cardiology

## 2017-02-25 ENCOUNTER — Other Ambulatory Visit: Payer: Self-pay | Admitting: Cardiology

## 2017-02-27 ENCOUNTER — Ambulatory Visit (INDEPENDENT_AMBULATORY_CARE_PROVIDER_SITE_OTHER): Payer: BLUE CROSS/BLUE SHIELD | Admitting: Nurse Practitioner

## 2017-02-27 ENCOUNTER — Encounter (INDEPENDENT_AMBULATORY_CARE_PROVIDER_SITE_OTHER): Payer: Self-pay

## 2017-02-27 ENCOUNTER — Encounter: Payer: Self-pay | Admitting: Nurse Practitioner

## 2017-02-27 VITALS — BP 132/90 | HR 103 | Ht 70.0 in | Wt 202.1 lb

## 2017-02-27 DIAGNOSIS — I259 Chronic ischemic heart disease, unspecified: Secondary | ICD-10-CM

## 2017-02-27 DIAGNOSIS — Z952 Presence of prosthetic heart valve: Secondary | ICD-10-CM | POA: Diagnosis not present

## 2017-02-27 DIAGNOSIS — R5382 Chronic fatigue, unspecified: Secondary | ICD-10-CM | POA: Diagnosis not present

## 2017-02-27 DIAGNOSIS — Z951 Presence of aortocoronary bypass graft: Secondary | ICD-10-CM | POA: Diagnosis not present

## 2017-02-27 MED ORDER — HYDROCHLOROTHIAZIDE 25 MG PO TABS: 12.5000 mg | ORAL_TABLET | Freq: Every day | ORAL | 3 refills | Status: DC

## 2017-02-27 MED ORDER — SIMVASTATIN 20 MG PO TABS: ORAL_TABLET | ORAL | 3 refills | Status: DC

## 2017-02-27 MED ORDER — ASPIRIN EC 81 MG PO TBEC: 81.0000 mg | DELAYED_RELEASE_TABLET | Freq: Every day | ORAL | Status: DC

## 2017-02-27 MED ORDER — BENAZEPRIL HCL 10 MG PO TABS: ORAL_TABLET | ORAL | 3 refills | Status: DC

## 2017-02-27 NOTE — Patient Instructions (Addendum)
We will be checking the following labs today - BMET, CBC, HPF, Lipids and TSH   Medication Instructions:    Continue with your current medicines. BUT  Cut the aspirin back to 81 mg  I sent in your other refills    Testing/Procedures To Be Arranged:  Echocardiogram  Follow-Up:   See Dr. Anne FuSkains for discussion and repeat EKG     Other Special Instructions:   N/A    If you need a refill on your cardiac medications before your next appointment, please call your pharmacy.   Call the [facility] office at [phone] if you have any questions, problems or concerns.

## 2017-02-27 NOTE — Progress Notes (Signed)
CARDIOLOGY OFFICE NOTE  Date:  02/27/2017    Jeffery [name] Date of Birth: July 07, 1956 Medical Record #[id number]  PCP:  Joycelyn RuaMeyers, Stephen, MD  Cardiologist:  [facility]  Chief Complaint  Patient presents with  . Coronary Artery Disease  . Cardiac Valve Problem    Follow up visit - seen for Dr. Anne FuSkains    History of Present Illness: Jeffery CossJerrell R [place] is a 61 y.o. male who presents today for a follow up visit. Seen for Dr. Anne FuSkains.   He has had prior AVR with bioprosthetic valve 08/20/13 along with single vessel bypass LIMA to LAD.  Other issues include HTN and HLD.   Has not been seen since January of 2017. Cardiac status felt to be stable at that time. He was having what was felt to be chest wall pain. Some leg weakness and was referred to neurology. Looks like he was diagnosed with Parkinson's.  Comes in today. Here with his wife. He is not doing well - very tired. No chest pain. Breathing is ok. Remains on high dose aspirin.  Has "atypical" Parkinson's and is currently not on treatment - says there is "nothing". Says he has not been here "because no one called me". Says he doesn't feel good anymore. Headaches over the past week. No fever, chills, cough or night sweats. Does not check his BP.  He does SBE. No chest pain. No awareness of his heart rhythm. No palpitations. Not dizzy or lightheaded. No syncope. 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 Has been told to file for disability. No regular exercise. No swelling and breathing seems to be ok.   Past Medical History:  Diagnosis Date  . Amaurosis fugax   . Amaurosis fugax 2012  . Aortic stenosis   . Bicuspid aortic valve   . Calculus of gallbladder   . Chest pain on exertion   . CHF (congestive heart failure) (HCC)   . Cholecystitis   . Coronary artery disease 08/01/2013   LAD stenosis  . Dysrhythmia   . Exertional shortness of breath     . Heart murmur   . Hypertension   . Hypertriglyceridemia   . Incidental pulmonary nodule, > 3mm and < 8mm 07/23/2013   5 mm L lung nodule and 4 mm R lung nodule seen incidentally on chest CT  . Osteoarthritis   . S/P aortic valve replacement with bioprosthetic valve 08/20/2013   25 mm [facility] bovine pericardial tissue valve  . S/P CABG x 1 08/20/2013   LIMA to LAD    Past Surgical History:  Procedure Laterality Date  . AORTIC VALVE REPLACEMENT N/A 08/20/2013   Procedure: AORTIC VALVE REPLACEMENT (AVR);  Surgeon: Purcell Nailslarence H [name], MD;  [facility];  Service: Open Heart Surgery;  Laterality: N/A;  . CHOLECYSTECTOMY    . CORONARY ARTERY BYPASS GRAFT N/A 08/20/2013   Procedure: CORONARY ARTERY BYPASS GRAFTING (CABG);  Surgeon: Purcell Nailslarence H [name], MD;  [facility];  Service: Open Heart Surgery;  Laterality: N/A;  CABG x one, using left internal mammary artery  . INTRAOPERATIVE TRANSESOPHAGEAL ECHOCARDIOGRAM N/A 08/20/2013   Procedure: INTRAOPERATIVE TRANSESOPHAGEAL ECHOCARDIOGRAM;  Surgeon: Purcell Nailslarence H [name], MD;  [facility];  Service: Open Heart Surgery;  Laterality: N/A;  . LEFT AND RIGHT HEART CATHETERIZATION WITH CORONARY ANGIOGRAM N/A 08/01/2013   Procedure: LEFT AND RIGHT HEART CATHETERIZATION WITH  CORONARY ANGIOGRAM;  Surgeon: Donato Schultz, MD;  Location: [facility] CATH LAB;  Service: Cardiovascular;  Laterality: N/A;  . NOSE SURGERY    . umbillical hernia       Medications: Current Outpatient Prescriptions  Medication Sig Dispense Refill  . amoxicillin (AMOXIL) 500 MG capsule TAKE 4 CAPSULES 1 HOUR PRIOT TO APPOINTMENT  0  . benazepril (LOTENSIN) 10 MG tablet TAKE 1 TABLET (10 MG TOTAL) BY MOUTH DAILY. 90 tablet 3  . cyclobenzaprine (FLEXERIL) 5 MG tablet TAKE 1 TABLET BY MOUTH AT BEDTIME FOR ONE WEEK, THEN INCREASE TO TAKE 1 TABLET TWICE A DAY 60 tablet 3  . DHA-EPA-VITAMIN E PO Take by mouth.    . fluticasone (FLONASE) 50 MCG/ACT nasal spray Place 2 sprays into the  nose daily as needed for allergies.     . hydrochlorothiazide (HYDRODIURIL) 25 MG tablet Take 0.5 tablets (12.5 mg total) by mouth daily. 45 tablet 3  . loratadine (CLARITIN) 10 MG tablet Take 10 mg by mouth daily.    . Multiple Vitamins-Minerals (MENS MULTIVITAMIN PLUS PO) Take 1 tablet by mouth daily.    . Omega-3 Fatty Acids (FISH OIL) 500 MG CAPS Take by mouth daily.    . simvastatin (ZOCOR) 20 MG tablet TAKE 1 TABLET BY MOUTH EVERY DAY AT 6PM 90 tablet 3  . aspirin EC 81 MG tablet Take 1 tablet (81 mg total) by mouth daily.     No current facility-administered medications for this visit.     Allergies: No Known Allergies  Social History: The patient  reports that he quit smoking about 24 years ago. He has quit using smokeless tobacco. He reports that he drinks about 0.6 oz of alcohol per week . He reports that he does not use drugs.   Family History: The patient's family history includes Allergic rhinitis in his sister; Alzheimer's disease (age of onset: 27) in his mother; Arthritis-Osteo in his brother; CAD in his father; Colon polyps in his daughter; Healthy in his daughter; Heart attack in his paternal uncle, paternal uncle, and paternal uncle; Heart attack (age of onset: 88) in his father; Other in his paternal grandfather.   Review of Systems: Please see the history of present illness.   Otherwise, the review of systems is positive for none.   All other systems are reviewed and negative.   Physical Exam: VS:  BP 132/90 (BP Location: Left Arm, Patient Position: Sitting, Cuff Size: Normal)   Pulse (!) 103   Ht 5\' 10"  (1.778 m)   Wt 202 lb 1.9 oz (91.7 kg)   BMI 29.00 kg/m  .  BMI Body mass index is 29 kg/m.  Wt Readings from Last 3 Encounters:  02/27/17 202 lb 1.9 oz (91.7 kg)  05/19/16 205 lb 8 oz (93.2 kg)  04/20/16 202 lb 3 oz (91.7 kg)   BP 122/90 by me.   General: Pleasant. Affect is flat. He is alert and in no acute distress.   HEENT: Normal.  Neck: Supple, no  JVD, carotid bruits, or masses noted.  Cardiac: Regular rate and rhythm but clearly faster today. Soft outflow murmur. May have an S3?  No edema.  Respiratory:  Lungs are clear to auscultation bilaterally with normal work of breathing.  GI: Soft and nontender.  MS: No deformity or atrophy. Gait and ROM intact. Shuffling gait.  Skin: Warm and dry. Color is normal.  Neuro:  Strength and sensation are intact and no gross focal deficits noted.  Psych: Alert, appropriate and with  normal affect.   LABORATORY DATA:  EKG:  EKG is ordered today. This demonstrates sinus tach with RBBB. His rate is faster today.   Lab Results  Component Value Date   WBC 5.4 05/05/2016   HGB 15.7 05/05/2016   HCT 44.7 05/05/2016   PLT 219.0 05/05/2016   GLUCOSE 79 05/05/2016   CHOL 146 06/04/2015   TRIG 257 (H) 06/04/2015   HDL 33 (L) 06/04/2015   LDLCALC 62 06/04/2015   ALT 33 05/05/2016   AST 24 05/05/2016   NA 138 05/05/2016   K 3.8 05/05/2016   CL 104 05/05/2016   CREATININE 1.09 05/05/2016   BUN 20 05/05/2016   CO2 28 05/05/2016   INR 1.47 08/20/2013   HGBA1C 5.4 08/18/2013    BNP (last 3 results) No results for input(s): BNP in the last 8760 hours.  ProBNP (last 3 results) No results for input(s): PROBNP in the last 8760 hours.   Other Studies Reviewed Today:  Echo Study Conclusions 10/2014  - Left ventricle: The cavity size was normal. There was mild concentric hypertrophy. Systolic function was vigorous. The estimated ejection fraction was in the range of 65% to 70%. Wall motion was normal; there were no regional wall motion abnormalities. Doppler parameters are consistent with abnormal left ventricular relaxation (grade 1 diastolic dysfunction). - Aortic valve: A bioprosthetic valve sits well in the aortic position. There is no central aortic regurgitation and no paravalvular leak. Transaortic gradients were peak 27 mmHg and mean 14 mmHg and are normal for this  type of prosthesis. - Mitral valve: Structurally normal valve. There was no regurgitation. - Right ventricle: Systolic function was normal. - Right atrium: The atrium was normal in size. - Tricuspid valve: There was trivial regurgitation. - Pulmonic valve: There was trivial regurgitation. - Pulmonary arteries: Systolic pressure was within the normal range. - Inferior vena cava: The vessel was normal in size. - Pericardium, extracardiac: There was no pericardial effusion.  Impressions:  - Normal biventricular size and systolic function. Abnormal relaxation with normal filling pressures. Normally functioning bioprosthetic valve in the aortic position. Normal size aortic root and ascending aorta.  Assessment/Plan:  1. Multitude of somatic complaints - may be Parkinson's or just all multifactorial. I will check lab today and get his echo updated. He is tachycardic today - may have a gallop on exam but no other clinical signs. Weight is stable. Further disposition to follow.    2. CAD with prior CABG x 1- no active chest pain reported.   2. Prior AVR - will get his echo updated. He is using SBE.   3. HLD - labs today. Statin refilled.  4. HTN - BP fair. Recheck by me is better. Meds refilled today. Labs today and updating the echo.   Current medicines are reviewed with the patient today.  The patient does not have concerns regarding medicines other than what has been noted above.  The following changes have been made:  See above.  Labs/ tests ordered today include:    Orders Placed This Encounter  Procedures  . Basic metabolic panel  . CBC  . Hepatic function panel  . Lipid panel  . TSH  . EKG 12-Lead  . ECHOCARDIOGRAM COMPLETE     Disposition:   FU with Dr. Anne Fu after the echo.    Patient is agreeable to this plan and will call if any problems develop in the interim.   SignedNorma Fredrickson, NP  02/27/2017 2:52 PM  [facility]  [address] [place], [place]  [address] Phone: [phone] Fax: [phone]

## 2017-02-28 ENCOUNTER — Other Ambulatory Visit: Payer: Self-pay | Admitting: *Deleted

## 2017-02-28 LAB — LIPID PANEL
Chol/HDL Ratio: 4.3 ratio (ref 0.0–5.0)
Cholesterol, Total: 152 mg/dL (ref 100–199)
HDL: 35 mg/dL — ABNORMAL LOW (ref >39)
LDL Calculated: 58 mg/dL (ref 0–99)
Triglycerides: 297 mg/dL — ABNORMAL HIGH (ref 0–149)
VLDL Cholesterol Cal: 59 mg/dL — ABNORMAL HIGH (ref 5–40)

## 2017-02-28 LAB — CBC
Hematocrit: 48.7 % (ref 37.5–51.0)
Hemoglobin: 16.4 g/dL (ref 13.0–17.7)
MCH: 29.5 pg (ref 26.6–33.0)
MCHC: 33.7 g/dL (ref 31.5–35.7)
MCV: 88 fL (ref 79–97)
Platelets: 239 10*3/uL (ref 150–379)
RBC: 5.56 x10E6/uL (ref 4.14–5.80)
RDW: 12.9 % (ref 12.3–15.4)
WBC: 8.6 10*3/uL (ref 3.4–10.8)

## 2017-02-28 LAB — BASIC METABOLIC PANEL
BUN/Creatinine Ratio: 15 (ref 9–20)
BUN: 18 mg/dL (ref 6–24)
CO2: 24 mmol/L (ref 18–29)
Calcium: 10.7 mg/dL — ABNORMAL HIGH (ref 8.7–10.2)
Chloride: 101 mmol/L (ref 96–106)
Creatinine, Ser: 1.22 mg/dL (ref 0.76–1.27)
GFR calc Af Amer: 75 mL/min/{1.73_m2} (ref >59)
GFR calc non Af Amer: 65 mL/min/{1.73_m2} (ref >59)
Glucose: 97 mg/dL (ref 65–99)
Potassium: 4.8 mmol/L (ref 3.5–5.2)
Sodium: 140 mmol/L (ref 134–144)

## 2017-02-28 LAB — HEPATIC FUNCTION PANEL
ALT: 37 IU/L (ref 0–44)
AST: 26 IU/L (ref 0–40)
Albumin: 4.9 g/dL (ref 3.5–5.5)
Alkaline Phosphatase: 59 IU/L (ref 39–117)
Bilirubin Total: 0.6 mg/dL (ref 0.0–1.2)
Bilirubin, Direct: 0.16 mg/dL (ref 0.00–0.40)
Total Protein: 7.7 g/dL (ref 6.0–8.5)

## 2017-02-28 LAB — TSH: TSH: 2.49 u[IU]/mL (ref 0.450–4.500)

## 2017-03-06 ENCOUNTER — Telehealth: Payer: Self-pay | Admitting: Cardiology

## 2017-03-06 NOTE — Telephone Encounter (Signed)
Reviewed lab results with pt's wife again per (DPR). Pt will be doing repeat lab work on Friday with Dr. Anne FuSkains appt.  Stated pt should tell blood bank calcium was high.  Pt can give blood but do not know parameters of what blood bank accepts. [place]

## 2017-03-06 NOTE — Telephone Encounter (Signed)
New Message     Pt would like his lab work results and can he donate blood today

## 2017-03-06 NOTE — Telephone Encounter (Signed)
Agree. Would check with [facility].

## 2017-03-09 ENCOUNTER — Ambulatory Visit (INDEPENDENT_AMBULATORY_CARE_PROVIDER_SITE_OTHER): Payer: BLUE CROSS/BLUE SHIELD | Admitting: Cardiology

## 2017-03-09 ENCOUNTER — Other Ambulatory Visit: Payer: BLUE CROSS/BLUE SHIELD | Admitting: *Deleted

## 2017-03-09 ENCOUNTER — Encounter: Payer: Self-pay | Admitting: Cardiology

## 2017-03-09 ENCOUNTER — Ambulatory Visit (HOSPITAL_COMMUNITY): Payer: BLUE CROSS/BLUE SHIELD | Attending: Cardiovascular Disease

## 2017-03-09 ENCOUNTER — Other Ambulatory Visit: Payer: Self-pay

## 2017-03-09 VITALS — BP 120/90 | HR 86 | Ht 70.0 in | Wt 202.4 lb

## 2017-03-09 DIAGNOSIS — R531 Weakness: Secondary | ICD-10-CM | POA: Diagnosis not present

## 2017-03-09 DIAGNOSIS — I422 Other hypertrophic cardiomyopathy: Secondary | ICD-10-CM | POA: Insufficient documentation

## 2017-03-09 DIAGNOSIS — Z952 Presence of prosthetic heart valve: Secondary | ICD-10-CM | POA: Diagnosis present

## 2017-03-09 DIAGNOSIS — I5030 Unspecified diastolic (congestive) heart failure: Secondary | ICD-10-CM | POA: Diagnosis not present

## 2017-03-09 DIAGNOSIS — Z951 Presence of aortocoronary bypass graft: Secondary | ICD-10-CM | POA: Diagnosis not present

## 2017-03-09 DIAGNOSIS — I510 Cardiac septal defect, acquired: Secondary | ICD-10-CM | POA: Insufficient documentation

## 2017-03-09 DIAGNOSIS — G20 Parkinson's disease: Secondary | ICD-10-CM | POA: Diagnosis not present

## 2017-03-09 DIAGNOSIS — I259 Chronic ischemic heart disease, unspecified: Secondary | ICD-10-CM

## 2017-03-09 DIAGNOSIS — R5382 Chronic fatigue, unspecified: Secondary | ICD-10-CM | POA: Diagnosis not present

## 2017-03-09 LAB — BASIC METABOLIC PANEL
BUN/Creatinine Ratio: 15 (ref 9–20)
BUN: 16 mg/dL (ref 6–24)
CO2: 23 mmol/L (ref 18–29)
Calcium: 10 mg/dL (ref 8.7–10.2)
Chloride: 105 mmol/L (ref 96–106)
Creatinine, Ser: 1.05 mg/dL (ref 0.76–1.27)
GFR calc Af Amer: 90 mL/min/{1.73_m2} (ref >59)
GFR calc non Af Amer: 78 mL/min/{1.73_m2} (ref >59)
Glucose: 94 mg/dL (ref 65–99)
Potassium: 4.1 mmol/L (ref 3.5–5.2)
Sodium: 142 mmol/L (ref 134–144)

## 2017-03-09 MED ORDER — TADALAFIL 20 MG PO TABS: 20.0000 mg | ORAL_TABLET | Freq: Every day | ORAL | 2 refills | Status: DC | PRN

## 2017-03-09 MED ORDER — SILDENAFIL CITRATE 25 MG PO TABS: 25.0000 mg | ORAL_TABLET | Freq: Every day | ORAL | 2 refills | Status: DC | PRN

## 2017-03-09 MED ORDER — SILDENAFIL CITRATE 20 MG PO TABS: 20.0000 mg | ORAL_TABLET | Freq: Every day | ORAL | 1 refills | Status: DC | PRN

## 2017-03-09 NOTE — Patient Instructions (Signed)

## 2017-03-09 NOTE — Progress Notes (Signed)
[address] [place], [place]  [address] Phone: [phone] Fax:  [phone]  Date:  03/09/2017   ID:  Jeffery [name], Jeffery [name] 08-04-1956, MRN [id number]  PCP:  Joycelyn Rua, MD   History of Present Illness: Jeffery [name] is a 61 y.o. male status post aortic valve replacement with bioprosthetic valve 08/20/13 with single vessel bypass LIMA to LAD here for followup.  Approximately 15 years ago he was diagnosed with bicuspid valve it is been followed for several years. He began to experience chest discomfort, shortness of breath. Sinotubular junction 4.2 cm.   Had dull chest pain whole day, then went away, the next day . , no shortness of breath, orthopnea, PND.  Has had left sided arm and leg weakness for 6 months. Hard to control left hand. Been going on so long can not remember initial symptoms. Helped daughter move and messed back up and seems to be going on since then. See's Chiropractor.   Restless sleep. Sometimes he flails in his sleep. His wife has a water bottle sprays him if needed.  03/09/17 - Dx with Parkinson. Weakness. Wake - dx. Tried sinimet but not work. PT. Left foot edema at times. Denies chest pain, no shortness of breath. Frustrated by his movement disorder especially his left arm. Calcium was 10.7 at last check. Triglycerides were 290. He is taking fish oil. LDL 58.  Wt Readings from Last 3 Encounters:  03/09/17 202 lb 6.4 oz (91.8 kg)  02/27/17 202 lb 1.9 oz (91.7 kg)  05/19/16 205 lb 8 oz (93.2 kg)     Past Medical History:  Diagnosis Date  . Amaurosis fugax   . Amaurosis fugax 2012  . Aortic stenosis   . Bicuspid aortic valve   . Calculus of gallbladder   . Chest pain on exertion   . CHF (congestive heart failure) (HCC)   . Cholecystitis   . Coronary artery disease 08/01/2013   LAD stenosis  . Dysrhythmia   . Exertional shortness of breath   . Heart murmur   . Hypertension   . Hypertriglyceridemia   . Incidental  pulmonary nodule, > 3mm and < 8mm 07/23/2013   5 mm L lung nodule and 4 mm R lung nodule seen incidentally on chest CT  . Osteoarthritis   . S/P aortic valve replacement with bioprosthetic valve 08/20/2013   25 mm [facility] Ease bovine pericardial tissue valve  . S/P CABG x 1 08/20/2013   LIMA to LAD    Past Surgical History:  Procedure Laterality Date  . AORTIC VALVE REPLACEMENT N/A 08/20/2013   Procedure: AORTIC VALVE REPLACEMENT (AVR);  Surgeon: Purcell Nails, MD;  Location: [facility] OR;  Service: Open Heart Surgery;  Laterality: N/A;  . CHOLECYSTECTOMY    . CORONARY ARTERY BYPASS GRAFT N/A 08/20/2013   Procedure: CORONARY ARTERY BYPASS GRAFTING (CABG);  Surgeon: Purcell Nails, MD;  [facility] OR;  Service: Open Heart Surgery;  Laterality: N/A;  CABG x one, using left internal mammary artery  . INTRAOPERATIVE TRANSESOPHAGEAL ECHOCARDIOGRAM N/A 08/20/2013   Procedure: INTRAOPERATIVE TRANSESOPHAGEAL ECHOCARDIOGRAM;  Surgeon: Purcell Nails, MD;  Location: [facility] OR;  Service: Open Heart Surgery;  Laterality: N/A;  . LEFT AND RIGHT HEART CATHETERIZATION WITH CORONARY ANGIOGRAM N/A 08/01/2013   Procedure: LEFT AND RIGHT HEART CATHETERIZATION WITH CORONARY ANGIOGRAM;  Surgeon: Donato Schultz, MD;  Location: [facility] CATH LAB;  Service: Cardiovascular;  Laterality: N/A;  . NOSE SURGERY    .  umbillical hernia      Current Outpatient Prescriptions  Medication Sig Dispense Refill  . amoxicillin (AMOXIL) 500 MG capsule TAKE 4 CAPSULES 1 HOUR PRIOT TO APPOINTMENT  0  . aspirin EC 81 MG tablet Take 1 tablet (81 mg total) by mouth daily.    . benazepril (LOTENSIN) 10 MG tablet TAKE 1 TABLET (10 MG TOTAL) BY MOUTH DAILY. 90 tablet 3  . cyclobenzaprine (FLEXERIL) 5 MG tablet TAKE 1 TABLET BY MOUTH AT BEDTIME FOR ONE WEEK, THEN INCREASE TO TAKE 1 TABLET TWICE A DAY 60 tablet 3  . DHA-EPA-VITAMIN E PO Take by mouth.    . fluticasone (FLONASE) 50 MCG/ACT nasal spray Place 2 sprays into the nose daily as  needed for allergies.     . hydrochlorothiazide (HYDRODIURIL) 25 MG tablet Take 0.5 tablets (12.5 mg total) by mouth daily. 45 tablet 3  . loratadine (CLARITIN) 10 MG tablet Take 10 mg by mouth daily.    . Multiple Vitamins-Minerals (MENS MULTIVITAMIN PLUS PO) Take 1 tablet by mouth daily.    . Omega-3 Fatty Acids (FISH OIL) 500 MG CAPS Take by mouth daily.    . simvastatin (ZOCOR) 20 MG tablet TAKE 1 TABLET BY MOUTH EVERY DAY AT 6PM 90 tablet 3  . sildenafil (VIAGRA) 25 MG tablet Take 1 tablet (25 mg total) by mouth daily as needed for erectile dysfunction. 50 tablet 2   No current facility-administered medications for this visit.     Allergies:   No Known Allergies  Social History:  The patient  reports that he quit smoking about 24 years ago. He has quit using smokeless tobacco. He reports that he drinks about 0.6 oz of alcohol per week . He reports that he does not use drugs.   ROS:  Please see the history of present illness.   Denies any bleeding, syncope, orthopnea, PND.   PHYSICAL EXAM: VS:  BP 120/90   Pulse 86   Ht 5\' 10"  (1.778 m)   Wt 202 lb 6.4 oz (91.8 kg)   BMI 29.04 kg/m  GEN: Well nourished, well developed, in no acute distress  HEENT: normal  Neck: no JVD, carotid bruits, or masses Cardiac: RRR; no murmurs, rubs, or gallops,no edema  Respiratory:  clear to auscultation bilaterally, normal work of breathing GI: soft, nontender, nondistended, + BS MS: no deformity or atrophy  Skin: warm and dry, no rash Neuro:  Alert and Oriented x 3, trouble with movement left arm Psych: euthymic mood, full affect  EKG: 11/05/14-sinus rhythm, first-degree AV block, heart rate 65, left anterior fascicular block, nonspecific interventricular block.  Postoperative echocardiogram: 11/05/14-normal ejection fraction-normal functioning bioprosthetic aortic valve.  ASSESSMENT AND PLAN:  1. Chest pain-no further occurrence. Improved.. 2. Left-sided weakness-has been diagnosed with a  form of parkinsonism. 3. Status post aortic valve replacement- Dr. Cornelius Moraswen. Prior bicuspid valve. Last note reviewed. No changes. Echo reassuring.. Dental prophylaxis. 4. Coronary artery disease-LIMA to LAD. Stable with no anginal symptoms 5. Hypertriglyceridemia-continue with diet, exercise, simvastatin. Continue fish oil. Dietary modifications. 6.  Hypertension-essential- overall stable, no changes. This seems reasonable. He also had a first-degree AV block on EKG. 7. 6 month f/u  Signed, Donato SchultzMark Remee Charley, MD [facility]  03/09/2017 10:18 AM

## 2017-03-09 NOTE — Telephone Encounter (Signed)
°  New Prob   Has a question regarding a prescription that was written today. Requesting to speak to a nurse.

## 2017-03-09 NOTE — Telephone Encounter (Signed)
Wife calling back because [facility] only has the 20 mg tabs and pt would like an RX sent to them.  Their fax # is [phone]  Advised I will review with Dr Anne FuSkains.

## 2017-03-23 ENCOUNTER — Other Ambulatory Visit: Payer: Self-pay | Admitting: Cardiology

## 2017-05-10 ENCOUNTER — Other Ambulatory Visit: Payer: Self-pay | Admitting: Cardiology

## 2017-05-25 ENCOUNTER — Other Ambulatory Visit: Payer: Self-pay | Admitting: Neurology

## 2017-05-28 ENCOUNTER — Other Ambulatory Visit: Payer: Self-pay | Admitting: *Deleted

## 2017-05-28 MED ORDER — CYCLOBENZAPRINE HCL 5 MG PO TABS: ORAL_TABLET | ORAL | 2 refills | Status: DC

## 2017-08-29 ENCOUNTER — Other Ambulatory Visit: Payer: Self-pay | Admitting: Neurology

## 2017-08-29 NOTE — Telephone Encounter (Signed)
Rx sent with 2 refills and a note stating that patient needs an appointment before anymore refills are given.

## 2017-11-19 ENCOUNTER — Telehealth: Payer: Self-pay | Admitting: Neurology

## 2017-11-19 NOTE — Telephone Encounter (Signed)
I looked through his last note but I did not see anything about a repeat MRI.  Please advise.

## 2017-11-19 NOTE — Telephone Encounter (Signed)
Patient's wife called and said that he was last seen in 05/2016 and had an MRI in July of 2017. He was told be Dr. [name] to have another one in 2019. Patient's wife was requesting that it could be set up in July? Please Call. Thanks

## 2017-11-19 NOTE — Telephone Encounter (Signed)
It [facility] Dr. Alpha GulaGoas, has requested repeat MRI brain wwo contrast.  He would need to be seen in the clinic first, before ordering the study since his last visit was 05/2016, unless their office will order it as he has been evaluated there more recently.    Jeffery [name] K. [name], DO

## 2017-11-20 NOTE — Telephone Encounter (Signed)
Called patient's wife back and requested for her to call the ordering doctor.  She said she would call him or patient's PCP.

## 2017-11-29 ENCOUNTER — Other Ambulatory Visit: Payer: Self-pay | Admitting: Neurology

## 2018-02-05 ENCOUNTER — Other Ambulatory Visit: Payer: Self-pay | Admitting: Thoracic Surgery (Cardiothoracic Vascular Surgery)

## 2018-02-05 DIAGNOSIS — Z736 Limitation of activities due to disability: Secondary | ICD-10-CM

## 2018-02-25 ENCOUNTER — Encounter: Payer: Self-pay | Admitting: Cardiology

## 2018-03-12 ENCOUNTER — Other Ambulatory Visit: Payer: Self-pay | Admitting: Nurse Practitioner

## 2018-03-18 ENCOUNTER — Encounter: Payer: Self-pay | Admitting: Cardiology

## 2018-03-18 ENCOUNTER — Encounter (INDEPENDENT_AMBULATORY_CARE_PROVIDER_SITE_OTHER): Payer: Self-pay

## 2018-03-18 ENCOUNTER — Ambulatory Visit: Payer: BLUE CROSS/BLUE SHIELD | Admitting: Cardiology

## 2018-03-18 VITALS — BP 140/96 | HR 82 | Ht 70.0 in | Wt 204.8 lb

## 2018-03-18 DIAGNOSIS — Z952 Presence of prosthetic heart valve: Secondary | ICD-10-CM | POA: Diagnosis not present

## 2018-03-18 DIAGNOSIS — I259 Chronic ischemic heart disease, unspecified: Secondary | ICD-10-CM

## 2018-03-18 DIAGNOSIS — G20 Parkinson's disease: Secondary | ICD-10-CM

## 2018-03-18 DIAGNOSIS — Z951 Presence of aortocoronary bypass graft: Secondary | ICD-10-CM

## 2018-03-18 MED ORDER — BENAZEPRIL HCL 20 MG PO TABS: ORAL_TABLET | ORAL | 3 refills | Status: DC

## 2018-03-18 NOTE — Progress Notes (Signed)
[address] [place], [place]  [address] Phone: [phone] Fax:  [phone]  Date:  03/18/2018   ID:  Jeffery OrleansJerrell R [name], DOB [birth year], MRN [id number]  PCP:  Lenell AntuLe, Thao P, DO   History of Present Illness: Jeffery CossJerrell R [name] is a 62 y.o. male status post aortic valve replacement with bioprosthetic valve 08/20/13 with single vessel bypass LIMA to LAD here for followup.  Approximately 15 years ago he was diagnosed with bicuspid valve it is been followed for several years. He began to experience chest discomfort, shortness of breath. Sinotubular junction 4.2 cm.   Had dull chest pain whole day, then went away, the next day 1min. , no shortness of breath, orthopnea, PND.  Has had left sided arm and leg weakness for 6 months. Hard to control left hand. Been going on so long can not remember initial symptoms. Helped daughter move and messed back up and seems to be going on since then. See's Chiropractor.   Restless sleep. Sometimes he flails in his sleep. His wife has a water bottle sprays him if needed.  03/09/17 - Dx with Parkinson. Weakness. [place] - dx. Tried sinimet but not work. PT. Left foot edema at times. Denies chest pain, no shortness of breath. Frustrated by his movement disorder especially his left arm. Calcium was 10.7 at last check. Triglycerides were 290. He is taking fish oil. LDL 58.  03/18/18 - Parkinsons.  Has advanced.  Blood pressure is elevated today.  Has been more elevated at home.  No chest pain, no syncope.  Overall feeling quite well from a cardiac standpoint.  Still taking dental prophylaxis.  Lab work reviewed, hemoglobin 15.2 osteophytes noted on back x-ray.  Creatinine 1.07, LDL 70  Wt Readings from Last 3 Encounters:  03/18/18 204 lb 12.8 oz (92.9 kg)  03/09/17 202 lb 6.4 oz (91.8 kg)  02/27/17 202 lb 1.9 oz (91.7 kg)     Past Medical History:  Diagnosis Date  . Amaurosis fugax   . Amaurosis fugax 2012  . Aortic stenosis   . Bicuspid  aortic valve   . Calculus of gallbladder   . Chest pain on exertion   . CHF (congestive heart failure) (HCC)   . Cholecystitis   . Coronary artery disease 08/01/2013   LAD stenosis  . Dysrhythmia   . Exertional shortness of breath   . Heart murmur   . Hypertension   . Hypertriglyceridemia   . Incidental pulmonary nodule, > 3mm and < 8mm 07/23/2013   5 mm L lung nodule and 4 mm R lung nodule seen incidentally on chest CT  . Osteoarthritis   . S/P aortic valve replacement with bioprosthetic valve 08/20/2013   25 mm [facility] bovine pericardial tissue valve  . S/P CABG x 1 08/20/2013   LIMA to LAD    Past Surgical History:  Procedure Laterality Date  . AORTIC VALVE REPLACEMENT N/A 08/20/2013   Procedure: AORTIC VALVE REPLACEMENT (AVR);  Surgeon: Purcell Nailslarence H [name], MD;  [facility];  Service: Open Heart Surgery;  Laterality: N/A;  . CHOLECYSTECTOMY    . CORONARY ARTERY BYPASS GRAFT N/A 08/20/2013   Procedure: CORONARY ARTERY BYPASS GRAFTING (CABG);  Surgeon: Purcell Nailslarence H [name], MD;  [facility];  Service: Open Heart Surgery;  Laterality: N/A;  CABG x one, using left internal mammary artery  . INTRAOPERATIVE TRANSESOPHAGEAL ECHOCARDIOGRAM N/A 08/20/2013   Procedure: INTRAOPERATIVE TRANSESOPHAGEAL ECHOCARDIOGRAM;  Surgeon: Purcell Nailslarence H [name], MD;  Location: [facility] OR;  Service: Open Heart Surgery;  Laterality: N/A;  . LEFT AND RIGHT HEART CATHETERIZATION WITH CORONARY ANGIOGRAM N/A 08/01/2013   Procedure: LEFT AND RIGHT HEART CATHETERIZATION WITH CORONARY ANGIOGRAM;  Surgeon: Donato Schultz, MD;  Location[facility]ATH LAB;  Service: Cardiovascular;  Laterality: N/A;  . NOSE SURGERY    . umbillical hernia      Current Outpatient Medications  Medication Sig Dispense Refill  . amoxicillin (AMOXIL) 500 MG capsule TAKE 4 CAPSULES 1 HOUR PRIOT TO APPOINTMENT  0  . aspirin EC 81 MG tablet Take 1 tablet (81 mg total) by mouth daily.    . benazepril (LOTENSIN) 20 MG tablet TAKE 1 TABLET BY  MOUTH EVERY DAY 90 tablet 3  . cyclobenzaprine (FLEXERIL) 5 MG tablet TAKE 1 TABLET BY MOUTH TWICE A DAY 60 tablet 2  . DHA-EPA-VITAMIN E PO Take by mouth.    . DULoxetine (CYMBALTA) 30 MG capsule Take 1 capsule by mouth daily.  2  . fluticasone (FLONASE) 50 MCG/ACT nasal spray Place 2 sprays into the nose daily as needed for allergies.     Marland Kitchen gabapentin (NEURONTIN) 100 MG capsule Take 1 capsule by mouth 3 (three) times daily.  2  . glucosamine-chondroitin 500-400 MG tablet Take 1 tablet by mouth 2 (two) times daily.    . hydrochlorothiazide (HYDRODIURIL) 25 MG tablet Take 0.5 tablets (12.5 mg total) by mouth daily. 45 tablet 3  . HYDROcodone-acetaminophen (NORCO/VICODIN) 5-325 MG tablet Take 1 tablet by mouth every 8 (eight) hours as needed.  0  . loratadine (CLARITIN) 10 MG tablet Take 10 mg by mouth daily.    . meloxicam (MOBIC) 7.5 MG tablet Take 1 tablet by mouth 2 (two) times daily.  2  . Multiple Vitamins-Minerals (MENS MULTIVITAMIN PLUS PO) Take 1 tablet by mouth daily.    . Omega-3 Fatty Acids (FISH OIL) 500 MG CAPS Take by mouth daily.    Marland Kitchen OVER THE COUNTER MEDICATION Take 1,000 mg by mouth 2 (two) times daily.    . sildenafil (REVATIO) 20 MG tablet Take 1 tablet (20 mg total) by mouth daily as needed. 90 tablet 1  . sildenafil (VIAGRA) 25 MG tablet Take 1 tablet (25 mg total) by mouth daily as needed for erectile dysfunction. 50 tablet 2  . simvastatin (ZOCOR) 20 MG tablet TAKE 1 TABLET BY MOUTH EVERY DAY AT 6PM 90 tablet 3   No current facility-administered medications for this visit.     Allergies:   No Known Allergies  Social History:  The patient  reports that he quit smoking about 25 years ago. He has quit using smokeless tobacco. He reports that he drinks about 0.6 oz of alcohol per week. He reports that he does not use drugs.   ROS:  Please see the history of present illness.   All other review systems negative  PHYSICAL EXAM: VS:  BP (!) 140/96   Pulse 82   Ht 5\' 10"   (1.778 m)   Wt 204 lb 12.8 oz (92.9 kg)   BMI 29.39 kg/m  GEN: Well nourished, well developed, in no acute distress  HEENT: normal  Neck: no JVD, carotid bruits, or masses Cardiac: RRR; no murmurs, rubs, or gallops,no edema  Respiratory:  clear to auscultation bilaterally, normal work of breathing GI: soft, nontender, nondistended, + BS MS: no deformity or atrophy  Skin: warm and dry, no rash Neuro:  Alert and Oriented x 3, Parkinson's disease noted Psych: euthymic mood, full affect   EKG:  03/18/2018-sinus rhythm left anterior fascicular block 82 right bundle branch blocklike morphology.  11/05/14-sinus rhythm, first-degree AV block, heart rate 65, left anterior fascicular block, nonspecific interventricular block.  Postoperative echocardiogram: 11/05/14-normal ejection fraction-normal functioning bioprosthetic aortic valve.  ASSESSMENT AND PLAN:   1. Left-sided weakness-has been diagnosed with a form of parkinsonism.  Continuing to advance. 2. Status post aortic valve replacement- Dr. Cornelius Moras. Prior bicuspid valve. Last note reviewed. No changes. Echo reassuring.. Dental prophylaxis.  Overall doing well.  No significant murmur heard on exam. 3. Coronary artery disease-LIMA to LAD. Stable with no anginal symptoms, continue to monitor.  Overall doing well. 4. Hypertriglyceridemia-continue with diet, exercise, simvastatin. Continue fish oil. Dietary modifications. 5. Hypertension-essential- blood pressure elevated.  We will go ahead and increase his benazepril to 20 mg.  He also had a first-degree AV block on EKG. 6. 12 month f/u  Signed, Donato Schultz, MD [facility]  03/18/2018 4:16 PM

## 2018-03-18 NOTE — Patient Instructions (Signed)
Medication Instructions:  Please increase you Benazepril to 20 mg a day. The current medical regimen is effective;  continue present plan and medications.  Follow-Up: Follow up in 1 year with Dr. Anne FuSkains.  You will receive a letter in the mail 2 months before you are due.  Please call us when you receive this letter to schedule your follow up appointment.  If you need a refill on your cardiac medications before your next appointment, please call your pharmacy.  Thank you for choosing  [facility]!!

## 2018-04-28 ENCOUNTER — Other Ambulatory Visit: Payer: Self-pay | Admitting: Nurse Practitioner

## 2019-03-10 ENCOUNTER — Other Ambulatory Visit: Payer: Self-pay | Admitting: Cardiology

## 2019-05-12 ENCOUNTER — Other Ambulatory Visit: Payer: Self-pay | Admitting: Nurse Practitioner

## 2019-06-02 ENCOUNTER — Other Ambulatory Visit: Payer: Self-pay | Admitting: Cardiology

## 2019-06-06 ENCOUNTER — Telehealth: Payer: Self-pay | Admitting: Cardiology

## 2019-06-06 NOTE — Telephone Encounter (Signed)
Poke with wife - pt is in a w/c now and having a lot of problems with Parkinson.  He is not having any cardiac concerns.  They are requesting his appt be changed to a virtual visit.  OK to change.  Aware of procedure.  They do have a smart phone with a camera and microphone and know how to use it.   Agreeable to the information below: YOUR CARDIOLOGY TEAM HAS ARRANGED FOR AN E-VISIT FOR YOUR APPOINTMENT - PLEASE REVIEW IMPORTANT INFORMATION BELOW SEVERAL DAYS PRIOR TO YOUR APPOINTMENT  Due to the recent COVID-19 pandemic, we are transitioning in-person office visits to tele-medicine visits in an effort to decrease unnecessary exposure to our patients, their families, and staff. These visits are billed to your insurance just like a normal visit is. We also encourage you to sign up for MyChart if you have not already done so. You will need a smartphone if possible. For patients that do not have this, we can still complete the visit using a regular telephone but do prefer a smartphone to enable video when possible. You may have a family member that lives with you that can help. If possible, we also ask that you have a blood pressure cuff and scale at home to measure your blood pressure, heart rate and weight prior to your scheduled appointment. Patients with clinical needs that need an in-person evaluation and testing will still be able to come to the office if absolutely necessary. If you have any questions, feel free to call our office.   YOUR PROVIDER WILL BE USING THE FOLLOWING PLATFORM TO COMPLETE YOUR VISIT:  [email or web address]  2-3 DAYS BEFORE YOUR APPOINTMENT  You will receive a telephone call from one of our [place] team members - your caller ID may say "Unknown caller." If this is a video visit, we will walk you through how to get the video launched on your phone. We will remind you check your blood pressure, heart rate and weight prior to your scheduled appointment. If you have an Apple Watch or Kardia,  please upload any pertinent ECG strips the day before or morning of your appointment to [facility]. Our staff will also make sure you have reviewed the consent and agree to move forward with your scheduled tele-health visit.   THE DAY OF YOUR APPOINTMENT  Approximately 15 minutes prior to your scheduled appointment, you will receive a telephone call from one of [place] team - your caller ID may say "Unknown caller."  Our staff will confirm medications, vital signs for the day and any symptoms you may be experiencing. Please have this information available prior to the time of visit start. It may also be helpful for you to have a pad of paper and pen handy for any instructions given during your visit. They will also walk you through joining the smartphone meeting if this is a video visit.  CONSENT FOR TELE-HEALTH VISIT - PLEASE REVIEW  I hereby voluntarily request, consent and authorize [facility] and its employed or contracted physicians, physician assistants, nurse practitioners or other licensed health care professionals (the Practitioner), to provide me with telemedicine health care services (the "Services") as deemed necessary by the treating Practitioner. I acknowledge and consent to receive the Services by the Practitioner via telemedicine. I understand that the telemedicine visit will involve communicating with the Practitioner through live audiovisual communication technology and the disclosure of certain medical information by electronic transmission. I acknowledge that I have been given the opportunity to request an  in-person assessment or other available alternative prior to the telemedicine visit and am voluntarily participating in the telemedicine visit.  I understand that I have the right to withhold or withdraw my consent to the use of telemedicine in the course of my care at any time, without affecting my right to future care or treatment, and that the Practitioner or I may terminate the  telemedicine visit at any time. I understand that I have the right to inspect all information obtained and/or recorded in the course of the telemedicine visit and may receive copies of available information for a reasonable fee.  I understand that some of the potential risks of receiving the Services via telemedicine include:  Marland Kitchen Delay or interruption in medical evaluation due to technological equipment failure or disruption; . Information transmitted may not be sufficient (e.g. poor resolution of images) to allow for appropriate medical decision making by the Practitioner; and/or  . In rare instances, security protocols could fail, causing a breach of personal health information.  Furthermore, I acknowledge that it is my responsibility to provide information about my medical history, conditions and care that is complete and accurate to the best of my ability. I acknowledge that Practitioner's advice, recommendations, and/or decision may be based on factors not within their control, such as incomplete or inaccurate data provided by me or distortions of diagnostic images or specimens that may result from electronic transmissions. I understand that the practice of medicine is not an exact science and that Practitioner makes no warranties or guarantees regarding treatment outcomes. I acknowledge that I will receive a copy of this consent concurrently upon execution via email to the email address I last provided but may also request a printed copy by calling the office of [place].    I understand that my insurance will be billed for this visit.   I have read or had this consent read to me. . I understand the contents of this consent, which adequately explains the benefits and risks of the Services being provided via telemedicine.  . I have been provided ample opportunity to ask questions regarding this consent and the Services and have had my questions answered to my satisfaction. . I give my informed  consent for the services to be provided through the use of telemedicine in my medical care  By participating in this telemedicine visit I agree to the above.

## 2019-06-06 NOTE — Telephone Encounter (Signed)
Patient's wife called wanting to know if his appt can be switched to a virtual visit.  Also he doesn't have insurance at this time, so if any test are needed it will have to wait until after December 1st.

## 2019-06-11 ENCOUNTER — Telehealth (INDEPENDENT_AMBULATORY_CARE_PROVIDER_SITE_OTHER): Payer: BLUE CROSS/BLUE SHIELD | Admitting: Cardiology

## 2019-06-11 ENCOUNTER — Other Ambulatory Visit: Payer: Self-pay

## 2019-06-11 ENCOUNTER — Encounter: Payer: Self-pay | Admitting: Cardiology

## 2019-06-11 VITALS — BP 127/78 | HR 83 | Ht 70.0 in | Wt 205.0 lb

## 2019-06-11 DIAGNOSIS — Z951 Presence of aortocoronary bypass graft: Secondary | ICD-10-CM

## 2019-06-11 DIAGNOSIS — I2510 Atherosclerotic heart disease of native coronary artery without angina pectoris: Secondary | ICD-10-CM | POA: Diagnosis not present

## 2019-06-11 DIAGNOSIS — G20 Parkinson's disease: Secondary | ICD-10-CM

## 2019-06-11 DIAGNOSIS — I10 Essential (primary) hypertension: Secondary | ICD-10-CM

## 2019-06-11 DIAGNOSIS — Z952 Presence of prosthetic heart valve: Secondary | ICD-10-CM

## 2019-06-11 NOTE — Progress Notes (Signed)
[facility], [address] [place], [place]  [address] Phone: [phone] Fax:  [phone]  Date:  06/11/2019   ID:  Jeffery [name], Jeffery [name] 1956-07-30, MRN [id number]  PCP:  Glenford Bayley, DO   History of Present Illness: Jeffery [name] is a 63 y.o. male status post aortic valve replacement with bioprosthetic valve 08/20/13 with single vessel bypass LIMA to LAD here for followup.  Approximately 15 years ago he was diagnosed with bicuspid valve it is been followed for several years. He began to experience chest discomfort, shortness of breath. Sinotubular junction 4.2 cm.   Had dull chest pain whole day, then went away, the next day 49min. , no shortness of breath, orthopnea, PND.  Has had left sided arm and leg weakness for 6 months. Hard to control left hand. Been going on so long can not remember initial symptoms. Helped daughter move and messed back up and seems to be going on since then. See's Chiropractor.   Restless sleep. Sometimes he flails in his sleep. His wife has a water bottle sprays him if needed.  03/09/17 - Dx with Parkinson. Weakness. [place] - dx. Tried sinimet but not work. PT. Left foot edema at times. Denies chest pain, no shortness of breath. Frustrated by his movement disorder especially his left arm. Calcium was 10.7 at last check. Triglycerides were 290. He is taking fish oil. LDL 58.  03/18/18 - Parkinsons.  Has advanced.  Blood pressure is elevated today.  Has been more elevated at home.  No chest pain, no syncope.  Overall feeling quite well from a cardiac standpoint.  Still taking dental prophylaxis.  Lab work reviewed, hemoglobin 15.2 osteophytes noted on back x-ray.  Creatinine 1.07, LDL 70.   06/11/2019- here for follow-up of aortic valve.  Telemedicine visit secondary to COVID-19 medication.  Video visit via [email or web address].  Denies any cardiac complaints at this time.  He was however quite distraught about the advancement of his Parkinson's disease.  He is sitting in  his chair during the evaluation today.  Picture of his daughter was in the background.  Teary-eyed at times.  Denies any chest pain fevers chills nausea vomiting.  Wt Readings from Last 3 Encounters:  06/11/19 205 lb (93 kg)  03/18/18 204 lb 12.8 oz (92.9 kg)  03/09/17 202 lb 6.4 oz (91.8 kg)     Past Medical History:  Diagnosis Date  . Amaurosis fugax   . Amaurosis fugax 2012  . Aortic stenosis   . Bicuspid aortic valve   . Calculus of gallbladder   . Chest pain on exertion   . CHF (congestive heart failure) ([place])   . Cholecystitis   . Coronary artery disease 08/01/2013   LAD stenosis  . Dysrhythmia   . Exertional shortness of breath   . Heart murmur   . Hypertension   . Hypertriglyceridemia   . Incidental pulmonary nodule, > 37mm and < 37mm 07/23/2013   5 mm L lung nodule and 4 mm R lung nodule seen incidentally on chest CT  . Osteoarthritis   . S/P aortic valve replacement with bioprosthetic valve 08/20/2013   25 mm [facility] Ease bovine pericardial tissue valve  . S/P CABG x 1 08/20/2013   LIMA to LAD    Past Surgical History:  Procedure Laterality Date  . AORTIC VALVE REPLACEMENT N/A 08/20/2013   Procedure: AORTIC VALVE REPLACEMENT (AVR);  Surgeon: Rexene Alberts, MD;  Location: [facility];  Service:  Open Heart Surgery;  Laterality: N/A;  . CHOLECYSTECTOMY    . CORONARY ARTERY BYPASS GRAFT N/A 08/20/2013   Procedure: CORONARY ARTERY BYPASS GRAFTING (CABG);  Surgeon: Purcell Nails, MD;  [facility];  Service: Open Heart Surgery;  Laterality: N/A;  CABG x one, using left internal mammary artery  . INTRAOPERATIVE TRANSESOPHAGEAL ECHOCARDIOGRAM N/A 08/20/2013   Procedure: INTRAOPERATIVE TRANSESOPHAGEAL ECHOCARDIOGRAM;  Surgeon: Purcell Nails, MD;  Location[facility];  Service: Open Heart Surgery;  Laterality: N/A;  . LEFT AND RIGHT HEART CATHETERIZATION WITH CORONARY ANGIOGRAM N/A 08/01/2013   Procedure: LEFT AND RIGHT HEART CATHETERIZATION WITH CORONARY ANGIOGRAM;   Surgeon: Donato Schultz, MD;  [facility];  Service: Cardiovascular;  Laterality: N/A;  . NOSE SURGERY    . umbillical hernia      Current Outpatient Medications  Medication Sig Dispense Refill  . amoxicillin (AMOXIL) 500 MG capsule TAKE 4 CAPSULES 1 HOUR PRIOT TO APPOINTMENT  0  . aspirin EC 81 MG tablet Take 1 tablet (81 mg total) by mouth daily.    . benazepril (LOTENSIN) 20 MG tablet Please keep upcoming appt for future refills. 90 tablet 0  . Carbidopa-Levodopa ER (SINEMET CR) 25-100 MG tablet controlled release Take 2 tablets by mouth 3 (three) times daily.    . cyclobenzaprine (FLEXERIL) 5 MG tablet TAKE 1 TABLET BY MOUTH TWICE A DAY 60 tablet 2  . DHA-EPA-VITAMIN E PO Take by mouth.    . DULoxetine (CYMBALTA) 30 MG capsule Take 1 capsule by mouth daily.  2  . gabapentin (NEURONTIN) 100 MG capsule Take 1 capsule by mouth 3 (three) times daily.  2  . glucosamine-chondroitin 500-400 MG tablet Take 1 tablet by mouth 2 (two) times daily.    . hydrochlorothiazide (HYDRODIURIL) 25 MG tablet TAKE 0.5 TABLETS (12.5 MG TOTAL) BY MOUTH DAILY. 45 tablet 3  . HYDROcodone-acetaminophen (NORCO/VICODIN) 5-325 MG tablet Take 1 tablet by mouth every 8 (eight) hours as needed.  0  . meloxicam (MOBIC) 7.5 MG tablet Take 1 tablet by mouth 2 (two) times daily.  2  . Multiple Vitamins-Minerals (MENS MULTIVITAMIN PLUS PO) Take 1 tablet by mouth daily.    . Omega-3 Fatty Acids (FISH OIL) 500 MG CAPS Take by mouth daily.    Marland Kitchen OVER THE COUNTER MEDICATION Take 1,000 mg by mouth 2 (two) times daily.    . sildenafil (REVATIO) 20 MG tablet Take 1 tablet (20 mg total) by mouth daily as needed. 90 tablet 1  . sildenafil (VIAGRA) 25 MG tablet Take 1 tablet (25 mg total) by mouth daily as needed for erectile dysfunction. 50 tablet 2  . simvastatin (ZOCOR) 20 MG tablet TAKE 1 TABLET BY MOUTH EVERY DAY AT 6PM 90 tablet 3   No current facility-administered medications for this visit.     Allergies:   No Known  Allergies  Social History:  The patient  reports that he quit smoking about 26 years ago. He has quit using smokeless tobacco. He reports current alcohol use of about 1.0 standard drinks of alcohol per week. He reports that he does not use drugs.   ROS:  Please see the history of present illness.   Denies any fever chills nausea vomiting syncope all other review systems negative  PHYSICAL EXAM: VS:  BP 127/78   Pulse 83   Ht 5\' 10"  (1.778 m)   Wt 205 lb (93 kg)   BMI 29.41 kg/m  GEN: Well nourished, well developed, in no acute distress  HEENT: normal Appearing Respiratory: Normal respiratory effort able to complete full sentences. MS: no deformity or atrophy, sitting in chair  Skin: No rashes noted Neuro:  Alert and Oriented  Psych: Teary-eyed at times    EKG: Not today. 03/18/2018-sinus rhythm left anterior fascicular block 82 right bundle branch blocklike morphology.  11/05/14-sinus rhythm, first-degree AV block, heart rate 65, left anterior fascicular block, nonspecific interventricular block.  Postoperative echocardiogram: 11/05/14-normal ejection fraction-normal functioning bioprosthetic aortic valve.  ASSESSMENT AND PLAN:   1. Parkinson's disease-has been advancing.  He is quite tearful about this.  Very frustrating for him.  Neurologic visits have been reviewed. 2. Status post aortic valve replacement- Dr. Cornelius Moraswen. Prior bicuspid valve. Last note reviewed. No changes. Echo reassuring.. Dental prophylaxis.  No changes made.  No significant murmur heard on exam previously. 3. Coronary artery disease-LIMA to LAD. Stable with denies any anginal symptoms 4. Hypertriglyceridemia-continue with diet, exercise, simvastatin. Continue fish oil. Dietary modifications.  No changes made 5. Hypertension-essential- blood pressure currently under good control.  Continue current regimen.  12 month f/u  Video visit performed.  No symptoms of COVID.  Signed, Donato SchultzMark Emmalou Hunger, MD [facility]  06/11/2019 11:52  AM

## 2019-06-11 NOTE — Patient Instructions (Signed)
Medication Instructions:   Your physician recommends that you continue on your current medications as directed. Please refer to the Current Medication list given to you today.  If you need a refill on your cardiac medications before your next appointment, please call your pharmacy.      Follow-Up: At [facility], you and your health needs are our priority.  As part of our continuing mission to provide you with exceptional heart care, we have created designated Provider Care Teams.  These Care Teams include your primary Cardiologist (physician) and Advanced Practice Providers (APPs -  Physician Assistants and Nurse Practitioners) who all work together to provide you with the care you need, when you need it. You will need a follow up appointment in 12 months with Dr. Marlou Porch.  Please call our office 2 months in advance to schedule this appointment.  You may see Dr. Marlou Porch or one of the following Advanced Practice Providers on your designated Care Team:   Truitt Merle, NP Cecilie Kicks, NP . Kathyrn Drown, NP

## 2019-08-28 ENCOUNTER — Other Ambulatory Visit: Payer: Self-pay | Admitting: Cardiology

## 2020-04-28 ENCOUNTER — Inpatient Hospital Stay (HOSPITAL_COMMUNITY)
Admission: EM | Admit: 2020-04-28 | Discharge: 2020-05-05 | DRG: 155 | Disposition: A | Discharge status: Skilled Nursing Facility | Payer: Medicare Other | Attending: Internal Medicine | Admitting: Internal Medicine

## 2020-04-28 ENCOUNTER — Other Ambulatory Visit: Payer: Self-pay

## 2020-04-28 ENCOUNTER — Emergency Department (HOSPITAL_COMMUNITY): Payer: Medicare Other

## 2020-04-28 ENCOUNTER — Encounter (HOSPITAL_COMMUNITY): Payer: Self-pay

## 2020-04-28 DIAGNOSIS — L03211 Cellulitis of face: Secondary | ICD-10-CM | POA: Diagnosis present

## 2020-04-28 DIAGNOSIS — K117 Disturbances of salivary secretion: Secondary | ICD-10-CM | POA: Diagnosis present

## 2020-04-28 DIAGNOSIS — K1121 Acute sialoadenitis: Principal | ICD-10-CM | POA: Diagnosis present

## 2020-04-28 DIAGNOSIS — I959 Hypotension, unspecified: Secondary | ICD-10-CM | POA: Diagnosis present

## 2020-04-28 DIAGNOSIS — E785 Hyperlipidemia, unspecified: Secondary | ICD-10-CM | POA: Diagnosis present

## 2020-04-28 DIAGNOSIS — I2510 Atherosclerotic heart disease of native coronary artery without angina pectoris: Secondary | ICD-10-CM | POA: Diagnosis present

## 2020-04-28 DIAGNOSIS — I10 Essential (primary) hypertension: Secondary | ICD-10-CM | POA: Diagnosis present

## 2020-04-28 DIAGNOSIS — Z79899 Other long term (current) drug therapy: Secondary | ICD-10-CM | POA: Diagnosis not present

## 2020-04-28 DIAGNOSIS — G20 Parkinson's disease: Secondary | ICD-10-CM | POA: Diagnosis present

## 2020-04-28 DIAGNOSIS — Z7401 Bed confinement status: Secondary | ICD-10-CM

## 2020-04-28 DIAGNOSIS — E781 Pure hyperglyceridemia: Secondary | ICD-10-CM | POA: Diagnosis present

## 2020-04-28 DIAGNOSIS — L03221 Cellulitis of neck: Secondary | ICD-10-CM | POA: Diagnosis not present

## 2020-04-28 DIAGNOSIS — Z8249 Family history of ischemic heart disease and other diseases of the circulatory system: Secondary | ICD-10-CM | POA: Diagnosis not present

## 2020-04-28 DIAGNOSIS — Z953 Presence of xenogenic heart valve: Secondary | ICD-10-CM

## 2020-04-28 DIAGNOSIS — G3185 Corticobasal degeneration: Secondary | ICD-10-CM | POA: Diagnosis present

## 2020-04-28 DIAGNOSIS — Z20822 Contact with and (suspected) exposure to covid-19: Secondary | ICD-10-CM | POA: Diagnosis present

## 2020-04-28 DIAGNOSIS — Z951 Presence of aortocoronary bypass graft: Secondary | ICD-10-CM | POA: Diagnosis not present

## 2020-04-28 DIAGNOSIS — L0390 Cellulitis, unspecified: Secondary | ICD-10-CM | POA: Diagnosis present

## 2020-04-28 DIAGNOSIS — Z7982 Long term (current) use of aspirin: Secondary | ICD-10-CM | POA: Diagnosis not present

## 2020-04-28 DIAGNOSIS — Z87891 Personal history of nicotine dependence: Secondary | ICD-10-CM

## 2020-04-28 DIAGNOSIS — K1120 Sialoadenitis, unspecified: Secondary | ICD-10-CM | POA: Diagnosis not present

## 2020-04-28 DIAGNOSIS — Z8371 Family history of colonic polyps: Secondary | ICD-10-CM | POA: Diagnosis not present

## 2020-04-28 DIAGNOSIS — D649 Anemia, unspecified: Secondary | ICD-10-CM | POA: Diagnosis present

## 2020-04-28 DIAGNOSIS — Z791 Long term (current) use of non-steroidal anti-inflammatories (NSAID): Secondary | ICD-10-CM | POA: Diagnosis not present

## 2020-04-28 DIAGNOSIS — E876 Hypokalemia: Secondary | ICD-10-CM | POA: Diagnosis not present

## 2020-04-28 DIAGNOSIS — Z820 Family history of epilepsy and other diseases of the nervous system: Secondary | ICD-10-CM | POA: Diagnosis not present

## 2020-04-28 LAB — CBC WITH DIFFERENTIAL/PLATELET
Abs Immature Granulocytes: 0.03 10*3/uL (ref 0.00–0.07)
Basophils Absolute: 0 10*3/uL (ref 0.0–0.1)
Basophils Relative: 0 %
Eosinophils Absolute: 0.1 10*3/uL (ref 0.0–0.5)
Eosinophils Relative: 1 %
HCT: 43.6 % (ref 39.0–52.0)
Hemoglobin: 14.6 g/dL (ref 13.0–17.0)
Immature Granulocytes: 0 %
Lymphocytes Relative: 13 %
Lymphs Abs: 1.5 10*3/uL (ref 0.7–4.0)
MCH: 30.7 pg (ref 26.0–34.0)
MCHC: 33.5 g/dL (ref 30.0–36.0)
MCV: 91.8 fL (ref 80.0–100.0)
Monocytes Absolute: 0.8 10*3/uL (ref 0.1–1.0)
Monocytes Relative: 7 %
Neutro Abs: 9 10*3/uL — ABNORMAL HIGH (ref 1.7–7.7)
Neutrophils Relative %: 79 %
Platelets: 161 10*3/uL (ref 150–400)
RBC: 4.75 MIL/uL (ref 4.22–5.81)
RDW: 12.6 % (ref 11.5–15.5)
WBC: 11.4 10*3/uL — ABNORMAL HIGH (ref 4.0–10.5)
nRBC: 0 % (ref 0.0–0.2)

## 2020-04-28 LAB — COMPREHENSIVE METABOLIC PANEL
ALT: 15 U/L (ref 0–44)
AST: 23 U/L (ref 15–41)
Albumin: 3.8 g/dL (ref 3.5–5.0)
Alkaline Phosphatase: 50 U/L (ref 38–126)
Anion gap: 12 (ref 5–15)
BUN: 23 mg/dL (ref 8–23)
CO2: 30 mmol/L (ref 22–32)
Calcium: 9.3 mg/dL (ref 8.9–10.3)
Chloride: 96 mmol/L — ABNORMAL LOW (ref 98–111)
Creatinine, Ser: 1.13 mg/dL (ref 0.61–1.24)
GFR calc Af Amer: >60 mL/min (ref >60)
GFR calc non Af Amer: >60 mL/min (ref >60)
Glucose, Bld: 115 mg/dL — ABNORMAL HIGH (ref 70–99)
Potassium: 3.6 mmol/L (ref 3.5–5.1)
Sodium: 138 mmol/L (ref 135–145)
Total Bilirubin: 1.1 mg/dL (ref 0.3–1.2)
Total Protein: 7.2 g/dL (ref 6.5–8.1)

## 2020-04-28 LAB — LACTIC ACID, PLASMA
Lactic Acid, Venous: 2 mmol/L (ref 0.5–1.9)
Lactic Acid, Venous: 1.4 mmol/L (ref 0.5–1.9)

## 2020-04-28 LAB — SARS CORONAVIRUS 2 BY RT PCR (HOSPITAL ORDER, PERFORMED IN ~~LOC~~ HOSPITAL LAB): SARS Coronavirus 2: NEGATIVE

## 2020-04-28 MED ORDER — IOHEXOL 300 MG/ML  SOLN
75.0000 mL | Freq: Once | INTRAMUSCULAR | Status: AC | PRN
  Administered 2020-04-28: 75 mL via INTRAVENOUS

## 2020-04-28 MED ORDER — HEPARIN SODIUM (PORCINE) 5000 UNIT/ML IJ SOLN
5000.0000 [IU] | Freq: Three times a day (TID) | INTRAMUSCULAR | Status: DC
  Administered 2020-04-29 – 2020-05-05 (×20): 5000 [IU] via SUBCUTANEOUS
  Filled 2020-04-28 (×20): qty 1

## 2020-04-28 MED ORDER — SODIUM CHLORIDE 0.9% FLUSH
3.0000 mL | Freq: Two times a day (BID) | INTRAVENOUS | Status: DC
  Administered 2020-04-29 – 2020-05-05 (×11): 3 mL via INTRAVENOUS

## 2020-04-28 MED ORDER — OXYCODONE HCL 5 MG PO TABS
5.0000 mg | ORAL_TABLET | ORAL | Status: DC | PRN
  Administered 2020-04-29 – 2020-05-05 (×26): 5 mg via ORAL
  Filled 2020-04-28 (×28): qty 1

## 2020-04-28 MED ORDER — SODIUM CHLORIDE 0.9 % IV BOLUS
1000.0000 mL | Freq: Once | INTRAVENOUS | Status: AC
  Administered 2020-04-28: 1000 mL via INTRAVENOUS

## 2020-04-28 MED ORDER — OXYCODONE-ACETAMINOPHEN 5-325 MG PO TABS
1.0000 | ORAL_TABLET | Freq: Once | ORAL | Status: AC
  Administered 2020-04-28: 1 via ORAL
  Filled 2020-04-28: qty 1

## 2020-04-28 MED ORDER — MORPHINE SULFATE (PF) 2 MG/ML IV SOLN
2.0000 mg | INTRAVENOUS | Status: DC | PRN
  Administered 2020-05-01 – 2020-05-02 (×2): 2 mg via INTRAVENOUS
  Filled 2020-04-28 (×2): qty 1

## 2020-04-28 MED ORDER — SODIUM CHLORIDE 0.9 % IV BOLUS
500.0000 mL | Freq: Once | INTRAVENOUS | Status: AC
  Administered 2020-04-28: 500 mL via INTRAVENOUS

## 2020-04-28 MED ORDER — SODIUM CHLORIDE 0.9 % IV SOLN
3.0000 g | Freq: Four times a day (QID) | INTRAVENOUS | Status: DC
  Administered 2020-04-29 – 2020-05-01 (×9): 3 g via INTRAVENOUS
  Filled 2020-04-28 (×4): qty 8
  Filled 2020-04-28: qty 3
  Filled 2020-04-28: qty 8
  Filled 2020-04-28 (×2): qty 3
  Filled 2020-04-28: qty 8
  Filled 2020-04-28 (×2): qty 3

## 2020-04-28 MED ORDER — SODIUM CHLORIDE 0.9 % IV SOLN: INTRAVENOUS | Status: DC

## 2020-04-28 MED ORDER — DULOXETINE HCL 30 MG PO CPEP
30.0000 mg | ORAL_CAPSULE | Freq: Every evening | ORAL | Status: DC
  Administered 2020-04-29 – 2020-05-04 (×6): 30 mg via ORAL
  Filled 2020-04-28 (×6): qty 1

## 2020-04-28 MED ORDER — DULOXETINE HCL 60 MG PO CPEP
60.0000 mg | ORAL_CAPSULE | Freq: Every day | ORAL | Status: DC
  Administered 2020-04-29 – 2020-05-05 (×7): 60 mg via ORAL
  Filled 2020-04-28 (×7): qty 1

## 2020-04-28 MED ORDER — MELATONIN 5 MG PO TABS
5.0000 mg | ORAL_TABLET | Freq: Every day | ORAL | Status: DC
  Administered 2020-04-29 – 2020-05-04 (×7): 5 mg via ORAL
  Filled 2020-04-28 (×7): qty 1

## 2020-04-28 MED ORDER — SODIUM CHLORIDE 0.9 % IV SOLN
3.0000 g | INTRAVENOUS | Status: AC
  Administered 2020-04-28: 3 g via INTRAVENOUS
  Filled 2020-04-28: qty 8

## 2020-04-28 MED ORDER — CARBIDOPA-LEVODOPA 25-100 MG PO TABS
2.0000 | ORAL_TABLET | Freq: Three times a day (TID) | ORAL | Status: DC
  Administered 2020-04-29 – 2020-05-05 (×19): 2 via ORAL
  Filled 2020-04-28 (×19): qty 2

## 2020-04-28 NOTE — ED Provider Notes (Signed)
[facility]-EMERGENCY DEPT Provider Note   CSN: [id number] Arrival date & time: 04/28/20  1732     History Chief Complaint  Patient presents with  . Jaw Pain    Jeffery [name] is a 64 y.o. male.  HPI Patient is a 64 year old male with medical history as noted below.  His wife states that 2 days ago he began experiencing exquisite pain and swelling in the preauricular region of the right face.  His symptoms continue to worsen and he was evaluated by his PCP yesterday.  She states "he got a shot of antibiotics" and was discharged on Augmentin.  He has been taking his Augmentin.  She states that his symptoms have continued to worsen and notes erythema across the right cheek, right neck, upper chest.  She states his symptoms are all new in the last 24 hours.  Patient continues to have worsening pain in the region.  He denies any difficulty swallowing or SOB at this time. His pain worsens with jaw movement and patient endorses some mild trismus.  His wife notes that patient had a temperature of 102 Fahrenheit yesterday and states that he was altered yesterday evening due to this.  He is afebrile at this time.  He denies any chills, URI symptoms, chest pain, shortness of breath, drooling, urinary changes, syncope.    Past Medical History:  Diagnosis Date  . Amaurosis fugax   . Amaurosis fugax 2012  . Aortic stenosis   . Bicuspid aortic valve   . Calculus of gallbladder   . Chest pain on exertion   . CHF (congestive heart failure) (HCC)   . Cholecystitis   . Coronary artery disease 08/01/2013   LAD stenosis  . Dysrhythmia   . Exertional shortness of breath   . Heart murmur   . Hypertension   . Hypertriglyceridemia   . Incidental pulmonary nodule, > 60mm and < 63mm 07/23/2013   5 mm L lung nodule and 4 mm R lung nodule seen incidentally on chest CT  . Osteoarthritis   . S/P aortic valve replacement with bioprosthetic valve 08/20/2013   25 mm [facility] Ease  bovine pericardial tissue valve  . 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 Left spastic hemiparesis (HCC) 11/11/2015  . S/P AVR (aortic valve replacement) 11/02/2014  . S/P aortic valve replacement with bioprosthetic valve + coronary artery bypass grafting 08/20/2013  . 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 Coronary artery disease 08/01/2013  . Incidental pulmonary nodule, > 43mm and < 63mm 07/23/2013  . Aortic stenosis 07/16/2013  . Hypertension 07/16/2013  . Osteoarthritis 07/16/2013  . Hypertriglyceridemia 07/16/2013  . Bicuspid aortic valve 07/16/2013  . Chest pain on exertion 07/16/2013  . Exertional shortness of breath 07/16/2013  . Amaurosis fugax 06/17/2011    Past Surgical History:  Procedure Laterality Date  . AORTIC VALVE REPLACEMENT N/A 08/20/2013   Procedure: AORTIC VALVE REPLACEMENT (AVR);  Surgeon: Purcell Nails, MD;  Location: [facility] OR;  Service: Open Heart Surgery;  Laterality: N/A;  . CHOLECYSTECTOMY    . CORONARY ARTERY BYPASS GRAFT N/A 08/20/2013   Procedure: CORONARY ARTERY BYPASS GRAFTING (CABG);  Surgeon: Purcell Nails, MD;  Location: [facility] OR;  Service: Open Heart Surgery;  Laterality: N/A;  CABG x one, using left internal mammary artery  . INTRAOPERATIVE TRANSESOPHAGEAL ECHOCARDIOGRAM N/A 08/20/2013   Procedure: INTRAOPERATIVE TRANSESOPHAGEAL ECHOCARDIOGRAM;  Surgeon: Salvatore Decent  Cornelius Moras, MD;  Location: [facility] OR;  Service: Open Heart Surgery;  Laterality: N/A;  . LEFT AND RIGHT HEART CATHETERIZATION WITH CORONARY ANGIOGRAM N/A 08/01/2013   Procedure: LEFT AND RIGHT HEART CATHETERIZATION WITH CORONARY ANGIOGRAM;  Surgeon: Donato Schultz, MD;  Location: [facility] CATH LAB;  Service: Cardiovascular;  Laterality: N/A;  . NOSE SURGERY    . umbillical hernia         Family History  Problem Relation Age of Onset  . Alzheimer's disease Mother 28  . CAD Father        MI  . Heart attack Father 97  . Arthritis-Osteo Brother   .  Allergic rhinitis Sister   . Colon polyps Daughter   . Heart attack Paternal Uncle   . Other Paternal Grandfather        Aortic Valve disorder  . Heart attack Paternal Uncle   . Heart attack Paternal Uncle   . Healthy Daughter     Social History   Tobacco Use  . Smoking status: Former Smoker    Quit date: 10/09/1992    Years since quitting: 27.5  . Smokeless tobacco: Former Neurosurgeon  . Tobacco comment: chewed on cigars  Vaping Use  . Vaping Use: Never used  Substance Use Topics  . Alcohol use: Yes    Alcohol/week: 1.0 standard drink    Types: 1 Cans of beer per week    Comment: 1 beer a month  . Drug use: No    Home Medications Prior to Admission medications   Medication Sig Start Date End Date Taking? Authorizing Provider  amoxicillin (AMOXIL) 500 MG capsule TAKE 4 CAPSULES 1 HOUR PRIOT TO APPOINTMENT 04/22/16   [provider]  aspirin EC 81 MG tablet Take 1 tablet (81 mg total) by mouth daily. 02/27/17   Rosalio Macadamia, NP  benazepril (LOTENSIN) 20 MG tablet Take 1 tablet (20 mg total) by mouth daily. 08/29/19   [name], MD  cyclobenzaprine (FLEXERIL) 5 MG tablet TAKE 1 TABLET BY MOUTH TWICE A DAY 08/29/17   Patel, Donika K, DO  DHA-EPA-VITAMIN E PO Take by mouth.    [provider]  DULoxetine (CYMBALTA) 30 MG capsule Take 1 capsule by mouth daily. 03/14/18   [provider]  gabapentin (NEURONTIN) 100 MG capsule Take 1 capsule by mouth 3 (three) times daily. 03/14/18   [provider]  glucosamine-chondroitin 500-400 MG tablet Take 1 tablet by mouth 2 (two) times daily.    [provider]  hydrochlorothiazide (HYDRODIURIL) 25 MG tablet TAKE 0.5 TABLETS (12.5 MG TOTAL) BY MOUTH DAILY. 05/13/19   Rosalio Macadamia, NP  HYDROcodone-acetaminophen (NORCO/VICODIN) 5-325 MG tablet Take 1 tablet by mouth every 8 (eight) hours as needed. 03/14/18   [provider]  meloxicam (MOBIC) 7.5 MG tablet Take 1 tablet by mouth 2 (two) times  daily. 03/14/18   [provider]  Multiple Vitamins-Minerals (MENS MULTIVITAMIN PLUS PO) Take 1 tablet by mouth daily.    [provider]  Omega-3 Fatty Acids (FISH OIL) 500 MG CAPS Take by mouth daily.    [provider]  OVER THE COUNTER MEDICATION Take 1,000 mg by mouth 2 (two) times daily.    [provider]  sildenafil (REVATIO) 20 MG tablet Take 1 tablet (20 mg total) by mouth daily as needed. 03/09/17   [name], MD  sildenafil (VIAGRA) 25 MG tablet Take 1 tablet (25 mg total) by mouth daily as needed for erectile dysfunction. 03/09/17  [name], MD  simvastatin (ZOCOR) 20 MG tablet TAKE 1 TABLET BY MOUTH EVERY DAY AT 6PM 02/27/17   Rosalio Macadamia, NP    Allergies    Patient has no known allergies.  Review of Systems   Review of Systems  Constitutional: Positive for fever. Negative for chills.  HENT: Positive for facial swelling. Negative for trouble swallowing and voice change.   Respiratory: Negative for shortness of breath.   Cardiovascular: Negative for chest pain and leg swelling.  Gastrointestinal: Negative for abdominal pain, diarrhea, nausea and vomiting.  Skin: Positive for color change and rash.  Neurological: Positive for headaches. Negative for syncope.   Physical Exam Updated Vital Signs BP 106/76 (BP Location: Right Arm)   Pulse 84   Temp 98.6 F (37 C) (Oral)   Resp (!) 25   SpO2 96%   Physical Exam Vitals and nursing note reviewed.  Constitutional:      General: He is in acute distress.     Appearance: Normal appearance. He is ill-appearing. He is not toxic-appearing or diaphoretic.  HENT:     Head: Normocephalic and atraumatic.     Comments: Moderate edema, erythema, exquisite tenderness noted along the right preauricular space overlying the parotid region with a point of induration.  Erythema spans the right cheek, right neck, upper chest.    Right Ear: External ear normal.     Left Ear: Tympanic membrane,  ear canal and external ear normal. There is no impacted cerumen.     Ears:     Comments: Unable to visualize right TM through right EAC, likely secondary to edema.    Nose: Nose normal.     Mouth/Throat:     Mouth: Mucous membranes are moist.     Pharynx: Oropharynx is clear. No oropharyngeal exudate or posterior oropharyngeal erythema.     Comments: Soft compartments noted in the submental space.  Mild trismus noted secondary to pain and edema.  Uvula is midline.  No tonsillar hypertrophy visualized.  Exam limited secondary to edema.  Multiple dental caries visualized.  No tenderness appreciated along the teeth or gingiva in the right mouth. Eyes:     General: No scleral icterus.       Right eye: No discharge.        Left eye: No discharge.     Extraocular Movements: Extraocular movements intact.     Conjunctiva/sclera: Conjunctivae normal.  Cardiovascular:     Rate and Rhythm: Normal rate and regular rhythm.     Pulses: Normal pulses.     Heart sounds: Normal heart sounds. No murmur heard.  No friction rub. No gallop.   Pulmonary:     Effort: Pulmonary effort is normal. No respiratory distress.     Breath sounds: Normal breath sounds. No stridor. No wheezing, rhonchi or rales.  Abdominal:     General: Abdomen is flat.     Tenderness: There is no abdominal tenderness.  Musculoskeletal:        General: Normal range of motion.     Cervical back: Normal range of motion and neck supple. No tenderness.  Skin:    General: Skin is warm and dry.     Findings: Erythema present.  Neurological:     Mental Status: He is alert and oriented to person, place, and time. Mental status is at baseline.  Psychiatric:        Mood and Affect: Mood normal.        Behavior: Behavior normal.  ED Results / Procedures / Treatments   Labs (all labs ordered are listed, but only abnormal results are displayed) Labs Reviewed  COMPREHENSIVE METABOLIC PANEL - Abnormal; Notable for the following  components:      Result Value   Chloride 96 (*)    Glucose, Bld 115 (*)    All other components within normal limits  CBC WITH DIFFERENTIAL/PLATELET - Abnormal; Notable for the following components:   WBC 11.4 (*)    Neutro Abs 9.0 (*)    All other components within normal limits  LACTIC ACID, PLASMA - Abnormal; Notable for the following components:   Lactic Acid, Venous 2.0 (*)    All other components within normal limits  CULTURE, BLOOD (ROUTINE X 2)  CULTURE, BLOOD (ROUTINE X 2)  SARS CORONAVIRUS 2 BY RT PCR (HOSPITAL ORDER, PERFORMED IN [facility])  LACTIC ACID, PLASMA  URINALYSIS, ROUTINE W REFLEX MICROSCOPIC   EKG None  Radiology CT Soft Tissue Neck W Contrast  Result Date: 04/28/2020 CLINICAL DATA:  Initial evaluation for acute cellulitis. EXAM: CT NECK WITH CONTRAST TECHNIQUE: Multidetector CT imaging of the neck was performed using the standard protocol following the bolus administration of intravenous contrast. CONTRAST:  75mL OMNIPAQUE IOHEXOL 300 MG/ML  SOLN COMPARISON:  None. FINDINGS: Pharynx and larynx: Oral cavity within normal limits without discrete mass or collection. Palatine tonsils symmetric and within normal limits. No tonsillar or peritonsillar abscess. Mild mucosal edema noted within the right oropharynx related to the acute inflammatory process within the right face. Remainder of the oropharynx and nasopharynx otherwise within normal limits. Trace retropharyngeal effusion, likely reactive. No frank retropharyngeal abscess. Epiglottis normal. Vallecula clear. Remainder of the hypopharynx and supraglottic larynx within normal limits. True cords grossly within normal limits allowing for patient positioning. Subglottic airway clear. Salivary glands: Asymmetric enlargement with irregularity and hyperenhancement involving the right parotid gland, consistent with acute parotitis. No obstructive stone seen within Stensen's duct. Associated swelling with  inflammatory stranding throughout the adjacent right face, with involvement of the right parotid, masticator, parapharyngeal, and submandibular spaces. Additional inflammatory stranding extends inferiorly along the anterior aspect of the right neck and upper chest wall. No discrete abscess or drainable fluid collection. Left parotid gland within normal limits. Submandibular glands within normal limits. Punctate stone noted at the right floor of mouth, likely in right Wharton's duct. No associated ductal dilatation. Thyroid: Normal. Lymph nodes: Mildly prominent right level II lymph nodes measure up to 1 cm, likely reactive. No other pathologically enlarged lymph nodes identified within the neck. Vascular: Normal intravascular enhancement seen throughout the neck. Few small foci of gas lucency within the right submandibular space likely lie within the venous system, suspected be related IV access. Mild-to-moderate atheromatous change about the aortic arch and carotid siphons. Limited intracranial: Unremarkable. Visualized orbits: Unremarkable. Mastoids and visualized paranasal sinuses: Paranasal sinuses are largely clear. Small chronic appearing bilateral mastoid effusions, of doubtful significance. Middle ear cavities are well pneumatized and free of fluid. Skeleton: No acute osseous abnormality. No discrete or worrisome osseous lesions. Moderate multilevel cervical spondylosis, most notable at C5-6 and C6-7. Median sternotomy wires noted. Upper chest: Visualized upper chest demonstrates no acute finding. Scattered atelectatic changes noted within the visualized lungs. Other: None. IMPRESSION: 1. Findings consistent with acute right parotitis with associated regional cellulitis as above. No obstructive stone, abscess, or other complication identified. 2. Punctate stone at the right floor of mouth, likely in the right Wharton's duct. No associated ductal dilatation. 3. Mildly prominent right  level II lymph nodes,  likely reactive. Electronically Signed   By: Rise Mu M.D.   On: 04/28/2020 20:52   Procedures Procedures   Medications Ordered in ED Medications  sodium chloride 0.9 % bolus 500 mL (0 mLs Intravenous Stopped 04/28/20 1922)  oxyCODONE-acetaminophen (PERCOCET/ROXICET) 5-325 MG per tablet 1 tablet (1 tablet Oral Given 04/28/20 1859)  Ampicillin-Sulbactam (UNASYN) 3 g in sodium chloride 0.9 % 100 mL IVPB (0 g Intravenous Stopped 04/28/20 2046)  sodium chloride 0.9 % bolus 1,000 mL (0 mLs Intravenous Stopped 04/28/20 2046)  iohexol (OMNIPAQUE) 300 MG/ML solution 75 mL (75 mLs Intravenous Contrast Given 04/28/20 2002)  sodium chloride 0.9 % bolus 1,000 mL (0 mLs Intravenous Stopped 04/28/20 2142)   ED Course  I have reviewed the triage vital signs and the nursing notes.  Pertinent labs & imaging results that were available during my care of the patient were reviewed by me and considered in my medical decision making (see chart for details).  Clinical Course as of Apr 28 2144  Wed Apr 28, 2020  1942 Lactic Acid, Venous(!!): 2.0 [LJ]  1942 WBC(!): 11.4 [LJ]  1942 NEUT#(!): 9.0 [LJ]  2126 1. Findings consistent with acute right parotitis with associated regional cellulitis as above. No obstructive stone, abscess, or other complication identified. 2. Punctate stone at the right floor of mouth, likely in the right Wharton's duct. No associated ductal dilatation. 3. Mildly prominent right level II lymph nodes, likely reactive.  CT Soft Tissue Neck W Contrast [LJ]  2136 Down from 2.0  Lactic Acid, Venous: 1.4 [LJ]    Clinical Course User Index [LJ] Placido Sou, PA-C   MDM Rules/Calculators/A&P                          Pt is a 64 y.o. male that present with a history, physical exam, ED Clinical Course as noted above.   Patient presents with worsening swelling along the right side of the face with overlying cellulitis.  CT scan of the soft tissue was obtained showing findings  consistent with acute right parotitis with associated regional cellulitis.  No obstructive stone, abscess, other complication identified.  Punctate stone at the right floor of the mouth, likely in the right Wharton's duct.  No associated ductal dilatation.  Patient was given IV fluids here in the emergency department as well as a dose of Unasyn.  Pain management with Percocet.  I discussed this patient with the hospitalist team for admission.  COVID-19 test has been ordered.  Note: Portions of this report may have been transcribed using voice recognition software. Every effort was made to ensure accuracy; however, inadvertent computerized transcription errors may be present.   Final Clinical Impression(s) / ED Diagnoses Final diagnoses:  Parotitis  Cellulitis of face   Rx / DC Orders ED Discharge Orders    None       Jannet Mantis 04/28/20 2149    Tilden Fossa, MD 04/29/20 1150

## 2020-04-28 NOTE — ED Notes (Signed)
Attempted to call report; nurse in a patient's room; will call back in 5 minutes

## 2020-04-28 NOTE — ED Triage Notes (Signed)
Pt arrived via EMS, per EMS pt started with jaw pain and redness with some associated ear and tooth pain. Pt was seen yesterday by PCP, rx abx. Pain worsening today.

## 2020-04-28 NOTE — ED Notes (Addendum)
ED TO INPATIENT HANDOFF REPORT  ED Nurse Name and Phone #: Sharene Skeans [phone]  S Name/Age/Gender Jeffery [name] 64 y.o. male Room/Bed: WA14/WA14  Code Status   Code Status: Prior  Home/SNF/Other Home Patient oriented to: self, place, time and situation Is this baseline? Yes   Triage Complete: Triage complete  Chief Complaint Cellulitis [L03.90]  Triage Note Pt arrived via EMS, per EMS pt started with jaw pain and redness with some associated ear and tooth pain. Pt was seen yesterday by PCP, rx abx. Pain worsening today.     Allergies No Known Allergies  Level of Care/Admitting Diagnosis ED Disposition    ED Disposition Condition Comment   Admit  Hospital Area: [facility] [[id number]]  Level of Care: Med-Surg [16]  May admit patient to [facility] or [facility] if equivalent level of care is available:: No  Covid Evaluation: Asymptomatic Screening Protocol (No Symptoms)  Diagnosis: Cellulitis [211941]  Admitting Physician: Lewie Chamber [phone]  Attending Physician: Lewie Chamber [phone]  Estimated length of stay: past midnight tomorrow  Certification:: I certify this patient will need inpatient services for at least 2 midnights       B Medical/Surgery History Past Medical History:  Diagnosis Date  . Amaurosis fugax   . Amaurosis fugax 2012  . Aortic stenosis   . Bicuspid aortic valve   . Calculus of gallbladder   . Chest pain on exertion   . CHF (congestive heart failure) (HCC)   . Cholecystitis   . Coronary artery disease 08/01/2013   LAD stenosis  . Dysrhythmia   . Exertional shortness of breath   . Heart murmur   . Hypertension   . Hypertriglyceridemia   . Incidental pulmonary nodule, > 70mm and < 62mm 07/23/2013   5 mm L lung nodule and 4 mm R lung nodule seen incidentally on chest CT  . Osteoarthritis   . S/P aortic valve replacement with bioprosthetic valve 08/20/2013   25 mm [facility] Ease bovine pericardial tissue valve   . 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 AORTIC VALVE REPLACEMENT N/A 08/20/2013   Procedure: AORTIC VALVE REPLACEMENT (AVR);  Surgeon: Purcell Nails, MD;  Location: [facility] OR;  Service: Open Heart Surgery;  Laterality: N/A;  . CHOLECYSTECTOMY    . CORONARY ARTERY BYPASS GRAFT N/A 08/20/2013   Procedure: CORONARY ARTERY BYPASS GRAFTING (CABG);  Surgeon: Purcell Nails, MD;  [facility] OR;  Service: Open Heart Surgery;  Laterality: N/A;  CABG x one, using left internal mammary artery  . INTRAOPERATIVE TRANSESOPHAGEAL ECHOCARDIOGRAM N/A 08/20/2013   Procedure: INTRAOPERATIVE TRANSESOPHAGEAL ECHOCARDIOGRAM;  Surgeon: Purcell Nails, MD;  Location: [facility] OR;  Service: Open Heart Surgery;  Laterality: N/A;  . LEFT AND RIGHT HEART CATHETERIZATION WITH CORONARY ANGIOGRAM N/A 08/01/2013   Procedure: LEFT AND RIGHT HEART CATHETERIZATION WITH CORONARY ANGIOGRAM;  Surgeon: Donato Schultz, MD;  Location: [facility] CATH LAB;  Service: Cardiovascular;  Laterality: N/A;  . NOSE SURGERY    . umbillical hernia       A IV Location/Drains/Wounds Patient Lines/Drains/Airways Status    Active Line/Drains/Airways    Name Placement date Placement time Site Days   Peripheral IV 04/28/20 Right Antecubital 04/28/20  1845  Antecubital  less than 1   Incision 08/20/13 Chest Other (Comment) 08/20/13  1132   2443          Intake/Output Last 24 hours No intake  or output data in the 24 hours ending 04/28/20 2307  Labs/Imaging Results for orders placed or performed during the hospital encounter of 04/28/20 (from the past 48 hour(s))  Lactic acid, plasma     Status: Abnormal   Collection Time: 04/28/20  6:21 PM  Result Value Ref Range   Lactic Acid, Venous 2.0 (HH) 0.5 - 1.9 mmol/L    Comment: CRITICAL RESULT CALLED TO, READ BACK BY AND VERIFIED WITH: BROOKS,B. RN @1937   04/28/20 BILLINGSLEY,L Performed at [facility], [address]  [place], [place]   Comprehensive metabolic panel     Status: Abnormal   Collection Time: 04/28/20  6:32 PM  Result Value Ref Range   Sodium 138 135 - 145 mmol/L   Potassium 3.6 3.5 - 5.1 mmol/L   Chloride 96 (L) 98 - 111 mmol/L   CO2 30 22 - 32 mmol/L   Glucose, Bld 115 (H) 70 - 99 mg/dL    Comment: Glucose reference range applies only to samples taken after fasting for at least 8 hours.   BUN 23 8 - 23 mg/dL   Creatinine, Ser 04/30/20 0.61 - 1.24 mg/dL   Calcium 9.3 8.9 - 3.97 mg/dL   Total Protein 7.2 6.5 - 8.1 g/dL   Albumin 3.8 3.5 - 5.0 g/dL   AST 23 15 - 41 U/L   ALT 15 0 - 44 U/L   Alkaline Phosphatase 50 38 - 126 U/L   Total Bilirubin 1.1 0.3 - 1.2 mg/dL   GFR calc non Af Amer >60 >60 mL/min   GFR calc Af Amer >60 >60 mL/min   Anion gap 12 5 - 15    Comment: Performed at [facility], [address], [place], [place]  CBC with Differential     Status: Abnormal   Collection Time: 04/28/20  6:32 PM  Result Value Ref Range   WBC 11.4 (H) 4.0 - 10.5 K/uL   RBC 4.75 4.22 - 5.81 MIL/uL   Hemoglobin 14.6 13.0 - 17.0 g/dL   HCT 04/30/20 39 - 52 %   MCV 91.8 80.0 - 100.0 fL   MCH 30.7 26.0 - 34.0 pg   MCHC 33.5 30.0 - 36.0 g/dL   RDW 90.2 40.9 - 73.5 %   Platelets 161 150 - 400 K/uL   nRBC 0.0 0.0 - 0.2 %   Neutrophils Relative % 79 %   Neutro Abs 9.0 (H) 1.7 - 7.7 K/uL   Lymphocytes Relative 13 %   Lymphs Abs 1.5 0.7 - 4.0 K/uL   Monocytes Relative 7 %   Monocytes Absolute 0.8 0 - 1 K/uL   Eosinophils Relative 1 %   Eosinophils Absolute 0.1 0 - 0 K/uL   Basophils Relative 0 %   Basophils Absolute 0.0 0 - 0 K/uL   Immature Granulocytes 0 %   Abs Immature Granulocytes 0.03 0.00 - 0.07 K/uL    Comment: Performed at [facility], [address] [place], [place]  Lactic acid, plasma     Status: None   Collection Time: 04/28/20  8:43 PM  Result Value Ref Range   Lactic Acid, Venous 1.4 0.5 - 1.9 mmol/L    Comment:  Performed at [facility], [address] [place], [place]  SARS Coronavirus 2 by RT PCR (hospital order, performed in [facility] hospital lab) Nasopharyngeal Nasopharyngeal Swab     Status: None   Collection Time: 04/28/20  9:41 PM  Specimen: Nasopharyngeal Swab  Result Value Ref Range   SARS Coronavirus 2 NEGATIVE NEGATIVE    Comment: (NOTE) SARS-CoV-2 target nucleic acids are NOT DETECTED.  The SARS-CoV-2 RNA is generally detectable in upper and lower respiratory specimens during the acute phase of infection. The lowest concentration of SARS-CoV-2 viral copies this assay can detect is 250 copies / mL. A negative result does not preclude SARS-CoV-2 infection and should not be used as the sole basis for treatment or other patient management decisions.  A negative result may occur with improper specimen collection / handling, submission of specimen other than nasopharyngeal swab, presence of viral mutation(s) within the areas targeted by this assay, and inadequate number of viral copies (<250 copies / mL). A negative result must be combined with clinical observations, patient history, and epidemiological information.  Fact Sheet for Patients:   BoilerBrush.com.cy  Fact Sheet for Healthcare Providers: https://pope.com/  This test is not yet approved or  cleared by the [place] FDA and has been authorized for detection and/or diagnosis of SARS-CoV-2 by FDA under an Emergency Use Authorization (EUA).  This EUA will remain in effect (meaning this test can be used) for the duration of the COVID-19 declaration under Section 564(b)(1) of the Act, 21 U.S.C. section 360bbb-3(b)(1), unless the authorization is terminated or revoked sooner.  Performed at [facility], [address] [place], [place] [address]    CT Soft Tissue Neck W Contrast  Result Date: 04/28/2020 CLINICAL DATA:   Initial evaluation for acute cellulitis. EXAM: CT NECK WITH CONTRAST TECHNIQUE: Multidetector CT imaging of the neck was performed using the standard protocol following the bolus administration of intravenous contrast. CONTRAST:  37mL OMNIPAQUE IOHEXOL 300 MG/ML  SOLN COMPARISON:  None. FINDINGS: Pharynx and larynx: Oral cavity within normal limits without discrete mass or collection. Palatine tonsils symmetric and within normal limits. No tonsillar or peritonsillar abscess. Mild mucosal edema noted within the right oropharynx related to the acute inflammatory process within the right face. Remainder of the oropharynx and nasopharynx otherwise within normal limits. Trace retropharyngeal effusion, likely reactive. No frank retropharyngeal abscess. Epiglottis normal. Vallecula clear. Remainder of the hypopharynx and supraglottic larynx within normal limits. True cords grossly within normal limits allowing for patient positioning. Subglottic airway clear. Salivary glands: Asymmetric enlargement with irregularity and hyperenhancement involving the right parotid gland, consistent with acute parotitis. No obstructive stone seen within Stensen's duct. Associated swelling with inflammatory stranding throughout the adjacent right face, with involvement of the right parotid, masticator, parapharyngeal, and submandibular spaces. Additional inflammatory stranding extends inferiorly along the anterior aspect of the right neck and upper chest wall. No discrete abscess or drainable fluid collection. Left parotid gland within normal limits. Submandibular glands within normal limits. Punctate stone noted at the right floor of mouth, likely in right Wharton's duct. No associated ductal dilatation. Thyroid: Normal. Lymph nodes: Mildly prominent right level II lymph nodes measure up to 1 cm, likely reactive. No other pathologically enlarged lymph nodes identified within the neck. Vascular: Normal intravascular enhancement seen  throughout the neck. Few small foci of gas lucency within the right submandibular space likely lie within the venous system, suspected be related IV access. Mild-to-moderate atheromatous change about the aortic arch and carotid siphons. Limited intracranial: Unremarkable. Visualized orbits: Unremarkable. Mastoids and visualized paranasal sinuses: Paranasal sinuses are largely clear. Small chronic appearing bilateral mastoid effusions, of doubtful significance. Middle ear cavities are well pneumatized and free of fluid. Skeleton: No acute osseous abnormality. No discrete or worrisome  osseous lesions. Moderate multilevel cervical spondylosis, most notable at C5-6 and C6-7. Median sternotomy wires noted. Upper chest: Visualized upper chest demonstrates no acute finding. Scattered atelectatic changes noted within the visualized lungs. Other: None. IMPRESSION: 1. Findings consistent with acute right parotitis with associated regional cellulitis as above. No obstructive stone, abscess, or other complication identified. 2. Punctate stone at the right floor of mouth, likely in the right Wharton's duct. No associated ductal dilatation. 3. Mildly prominent right level II lymph nodes, likely reactive. Electronically Signed   By: Rise MuBenjamin  McClintock M.D.   On: 04/28/2020 20:52    Pending Labs Unresulted Labs (From admission, onward) Comment          Start     Ordered   04/28/20 1821  Blood culture (routine x 2)  BLOOD CULTURE X 2,   STAT      04/28/20 1820   04/28/20 1812  Urinalysis, Routine w reflex microscopic  ONCE - STAT,   STAT        04/28/20 1820   Signed and Held  CBC  (heparin)  Once,   R       Comments: Baseline for heparin therapy IF NOT ALREADY DRAWN.  Notify MD if PLT < 100 K.    Signed and Held   Signed and Held  Creatinine, serum  (heparin)  Once,   R       Comments: Baseline for heparin therapy IF NOT ALREADY DRAWN.    Signed and Held   Signed and Held  Basic metabolic panel  Tomorrow  morning,   R        Signed and Held   Signed and Held  CBC with Differential/Platelet  Tomorrow morning,   R        Signed and Held   Signed and Held  Magnesium  Tomorrow morning,   R        Signed and Held          Vitals/Pain Today's Vitals   04/28/20 2100 04/28/20 2154 04/28/20 2200 04/28/20 2300  BP: 104/75  98/76 99/74  Pulse: 90  88 90  Resp: 15  16 17   Temp:      TempSrc:      SpO2: 94%  93% 92%  Weight:      Height:      PainSc:  5       Isolation Precautions No active isolations  Medications Medications  sodium chloride 0.9 % bolus 500 mL (0 mLs Intravenous Stopped 04/28/20 1922)  oxyCODONE-acetaminophen (PERCOCET/ROXICET) 5-325 MG per tablet 1 tablet (1 tablet Oral Given 04/28/20 1859)  Ampicillin-Sulbactam (UNASYN) 3 g in sodium chloride 0.9 % 100 mL IVPB (0 g Intravenous Stopped 04/28/20 2046)  sodium chloride 0.9 % bolus 1,000 mL (0 mLs Intravenous Stopped 04/28/20 2046)  iohexol (OMNIPAQUE) 300 MG/ML solution 75 mL (75 mLs Intravenous Contrast Given 04/28/20 2002)  sodium chloride 0.9 % bolus 1,000 mL (0 mLs Intravenous Stopped 04/28/20 2142)    Mobility non-ambulatory High fall risk   Focused Assessments Med surg Recommendations: See Admitting Provider Note  Report given to:   Additional Notes:

## 2020-04-28 NOTE — Progress Notes (Signed)
A consult was received from an ED provider for Unasyn per pharmacy dosing.  The patient's profile has been reviewed for ht/wt/allergies/indication/available labs.    A one time order has been placed for Unasyn 3g IV.  Further antibiotics/pharmacy consults should be ordered by admitting physician if indicated.                       Thank you, Jamse Mead 04/28/2020  6:58 PM

## 2020-04-28 NOTE — ED Notes (Signed)
At this time PT still unable to provide urine

## 2020-04-29 DIAGNOSIS — K1120 Sialoadenitis, unspecified: Secondary | ICD-10-CM

## 2020-04-29 DIAGNOSIS — L03221 Cellulitis of neck: Secondary | ICD-10-CM

## 2020-04-29 DIAGNOSIS — L03211 Cellulitis of face: Secondary | ICD-10-CM

## 2020-04-29 DIAGNOSIS — G3185 Corticobasal degeneration: Secondary | ICD-10-CM

## 2020-04-29 LAB — CBC WITH DIFFERENTIAL/PLATELET
Abs Immature Granulocytes: 0.02 10*3/uL (ref 0.00–0.07)
Basophils Absolute: 0 10*3/uL (ref 0.0–0.1)
Basophils Relative: 1 %
Eosinophils Absolute: 0.2 10*3/uL (ref 0.0–0.5)
Eosinophils Relative: 2 %
HCT: 37.3 % — ABNORMAL LOW (ref 39.0–52.0)
Hemoglobin: 12.4 g/dL — ABNORMAL LOW (ref 13.0–17.0)
Immature Granulocytes: 0 %
Lymphocytes Relative: 21 %
Lymphs Abs: 1.7 10*3/uL (ref 0.7–4.0)
MCH: 30.8 pg (ref 26.0–34.0)
MCHC: 33.2 g/dL (ref 30.0–36.0)
MCV: 92.8 fL (ref 80.0–100.0)
Monocytes Absolute: 0.6 10*3/uL (ref 0.1–1.0)
Monocytes Relative: 8 %
Neutro Abs: 5.8 10*3/uL (ref 1.7–7.7)
Neutrophils Relative %: 68 %
Platelets: 143 10*3/uL — ABNORMAL LOW (ref 150–400)
RBC: 4.02 MIL/uL — ABNORMAL LOW (ref 4.22–5.81)
RDW: 12.6 % (ref 11.5–15.5)
WBC: 8.3 10*3/uL (ref 4.0–10.5)
nRBC: 0 % (ref 0.0–0.2)

## 2020-04-29 LAB — URINALYSIS, ROUTINE W REFLEX MICROSCOPIC
Bilirubin Urine: NEGATIVE
Glucose, UA: NEGATIVE mg/dL
Hgb urine dipstick: NEGATIVE
Ketones, ur: NEGATIVE mg/dL
Leukocytes,Ua: NEGATIVE
Nitrite: NEGATIVE
Protein, ur: NEGATIVE mg/dL
Specific Gravity, Urine: 1.02 (ref 1.005–1.030)
pH: 5 (ref 5.0–8.0)

## 2020-04-29 LAB — BASIC METABOLIC PANEL
Anion gap: 8 (ref 5–15)
BUN: 18 mg/dL (ref 8–23)
CO2: 26 mmol/L (ref 22–32)
Calcium: 8.4 mg/dL — ABNORMAL LOW (ref 8.9–10.3)
Chloride: 103 mmol/L (ref 98–111)
Creatinine, Ser: 0.89 mg/dL (ref 0.61–1.24)
GFR calc Af Amer: >60 mL/min (ref >60)
GFR calc non Af Amer: >60 mL/min (ref >60)
Glucose, Bld: 97 mg/dL (ref 70–99)
Potassium: 2.8 mmol/L — ABNORMAL LOW (ref 3.5–5.1)
Sodium: 137 mmol/L (ref 135–145)

## 2020-04-29 LAB — POTASSIUM: Potassium: 3.8 mmol/L (ref 3.5–5.1)

## 2020-04-29 LAB — MAGNESIUM: Magnesium: 1.8 mg/dL (ref 1.7–2.4)

## 2020-04-29 MED ORDER — IBUPROFEN 400 MG PO TABS
600.0000 mg | ORAL_TABLET | Freq: Three times a day (TID) | ORAL | Status: AC
  Administered 2020-04-29 – 2020-05-01 (×6): 600 mg via ORAL
  Filled 2020-04-29 (×6): qty 1

## 2020-04-29 MED ORDER — PANTOPRAZOLE SODIUM 40 MG PO TBEC
40.0000 mg | DELAYED_RELEASE_TABLET | Freq: Every day | ORAL | Status: DC
  Administered 2020-04-29 – 2020-05-05 (×7): 40 mg via ORAL
  Filled 2020-04-29 (×7): qty 1

## 2020-04-29 MED ORDER — POTASSIUM CHLORIDE CRYS ER 20 MEQ PO TBCR
40.0000 meq | EXTENDED_RELEASE_TABLET | Freq: Once | ORAL | Status: AC
  Administered 2020-04-29: 40 meq via ORAL
  Filled 2020-04-29: qty 2

## 2020-04-29 MED ORDER — POTASSIUM CHLORIDE 10 MEQ/100ML IV SOLN
10.0000 meq | INTRAVENOUS | Status: AC
  Administered 2020-04-29 (×4): 10 meq via INTRAVENOUS
  Filled 2020-04-29 (×4): qty 100

## 2020-04-29 MED ORDER — ONDANSETRON 4 MG PO TBDP
8.0000 mg | ORAL_TABLET | Freq: Three times a day (TID) | ORAL | Status: AC
  Administered 2020-04-29 – 2020-05-01 (×6): 8 mg via ORAL
  Filled 2020-04-29 (×6): qty 2

## 2020-04-29 MED ORDER — BISACODYL 10 MG RE SUPP
10.0000 mg | Freq: Every day | RECTAL | Status: DC | PRN
  Administered 2020-04-29: 10 mg via RECTAL
  Filled 2020-04-29: qty 1

## 2020-04-29 NOTE — Hospital Course (Addendum)
Jeffery [name] is a 64 yo male with PMH corticobasal syndrome (Parkinsonian), AVR (bioprosthetic), CAD s/p CABG x 1 (LIMA to LAD), HTN, HLD who presented to the ER with his wife due to complaints of worsening pain and swelling in his right face down to his right neck.  History is provided by the patient's wife who is bedside in the ER.  She states that on Sunday the patient began feeling bad with associated swelling around his right ear.  His symptoms progressively worsened until Tuesday when his swelling extended down to his right neck.  She states that they received an antibiotic shot in the office on Tuesday and was given a prescription for Augmentin to continue at home.  Due to his symptoms still worsening today along with altered mentation and fever, 102 at home, he was told to present to the ER for further evaluation. Patient and wife deny any purulent discharge from outside his face or neck.  She states due to his Parkinson's, he has excessive salivation in his mouth as well, but also denies any purulent discharge inside his mouth.  In the ER he underwent CT neck which revealed acute parotitis but no obstructive stone appreciated.  There was a punctate stone noted in the right floor of the mouth.  Findings were consistent with surrounding cellulitis as well. He was given a dose of Unasyn in the ER and is admitted for further IV antibiotics and monitoring in case of need for surgical evaluation.

## 2020-04-29 NOTE — H&P (Signed)
History and Physical    Jeffery [name]  XBJ:[id number]  DOB: 06/27/1956  DOA: 04/28/2020  PCP: Jeffery RuaMeyers, Stephen, MD Patient coming from: home  Chief Complaint: right face pain  HPI:  Jeffery [name] is a 64 yo male with PMH corticobasal syndrome (Parkinsonian), AVR (bioprosthetic), CAD s/p CABG x 1 (LIMA to LAD), HTN, HLD who presented to the ER with his wife due to complaints of worsening pain and swelling in his right face down to his right neck.  History is provided by the patient's wife who is bedside in the ER.  She states that on Sunday the patient began feeling bad with associated swelling around his right ear.  His symptoms progressively worsened until Tuesday when his swelling extended down to his right neck.  She states that they received an antibiotic shot in the office on Tuesday and was given a prescription for Augmentin to continue at home.  Due to his symptoms still worsening today along with altered mentation and fever, 102 at home, he was told to present to the ER for further evaluation. Patient and wife deny any purulent discharge from outside his face or neck.  She states due to his Parkinson's, he has excessive salivation in his mouth as well, but also denies any purulent discharge inside his mouth.  In the ER he underwent CT neck which revealed acute parotitis but no obstructive stone appreciated.  There was a punctate stone noted in the right floor of the mouth.  Findings were consistent with surrounding cellulitis as well. He was given a dose of Unasyn in the ER and is admitted for further IV antibiotics and monitoring in case of need for surgical evaluation.   I have personally briefly reviewed patient's old medical records [facility] and discussed patient with the ER provider when appropriate/indicated.  Assessment/Plan: Hypertension - hypotensive in the ER - hold meds and resume as able   Corticobasal syndrome [facility](HCC) - follows outpatient with neurology; last  seen 03/21; note reviewed in care everywhere -Does not respond to Sinemet, however wife insists for patient to remain on -Patient is bedbound at this time  Cellulitis -Right-sided parotitis with surrounding cellulitis.  No obvious abscess.  No purulent drainage at home prior to admission.  Cheek and neck are indurated with appropriate level of pain, not out of proportion -CT reveals "Findings consistent with acute right parotitis with associated regional cellulitis as above. No obstructive stone, abscess, or other complication identified." - consider surgery consult in am to follow along in case of need for further intervention/testing - continue Unasyn 3 g IV q6h - morphine for pain control; monitor BP as already low/normal in ER   Code Status: Full DVT Prophylaxis:unfractionated SQ heparin 5000 units 2 hours prior to surgery then every 12 hours Anticipated disposition is to home  History: Past Medical History:  Diagnosis Date  . Amaurosis fugax   . Amaurosis fugax 2012  . Aortic stenosis   . Bicuspid aortic valve   . Calculus of gallbladder   . Chest pain on exertion   . CHF (congestive heart failure) (HCC)   . Cholecystitis   . Coronary artery disease 08/01/2013   LAD stenosis  . Dysrhythmia   . Exertional shortness of breath   . Heart murmur   . Hypertension   . Hypertriglyceridemia   . Incidental pulmonary nodule, > 3mm and < 8mm 07/23/2013   5 mm L lung nodule and 4 mm R lung nodule seen incidentally on chest CT  .  Osteoarthritis   . S/P aortic valve replacement with bioprosthetic valve 08/20/2013   25 mm [facility] Ease bovine pericardial tissue valve  . S/P CABG x 1 08/20/2013   LIMA to LAD    Past Surgical History:  Procedure Laterality Date  . AORTIC VALVE REPLACEMENT N/A 08/20/2013   Procedure: AORTIC VALVE REPLACEMENT (AVR);  Surgeon: Purcell Nails, MD;  Location: [facility] OR;  Service: Open Heart Surgery;  Laterality: N/A;  . CHOLECYSTECTOMY    . CORONARY  ARTERY BYPASS GRAFT N/A 08/20/2013   Procedure: CORONARY ARTERY BYPASS GRAFTING (CABG);  Surgeon: Purcell Nails, MD;  Location: [facility] OR;  Service: Open Heart Surgery;  Laterality: N/A;  CABG x one, using left internal mammary artery  . INTRAOPERATIVE TRANSESOPHAGEAL ECHOCARDIOGRAM N/A 08/20/2013   Procedure: INTRAOPERATIVE TRANSESOPHAGEAL ECHOCARDIOGRAM;  Surgeon: Purcell Nails, MD;  Location: [facility] OR;  Service: Open Heart Surgery;  Laterality: N/A;  . LEFT AND RIGHT HEART CATHETERIZATION WITH CORONARY ANGIOGRAM N/A 08/01/2013   Procedure: LEFT AND RIGHT HEART CATHETERIZATION WITH CORONARY ANGIOGRAM;  Surgeon: Donato Schultz, MD;  Location: [facility] CATH LAB;  Service: Cardiovascular;  Laterality: N/A;  . NOSE SURGERY    . umbillical hernia       reports that he quit smoking about 27 years ago. He has quit using smokeless tobacco. He reports current alcohol use of about 1.0 standard drink of alcohol per week. He reports that he does not use drugs.  No Known Allergies  Family History  Problem Relation Age of Onset  . Alzheimer's disease Mother 13  . CAD Father        MI  . Heart attack Father 96  . Arthritis-Osteo Brother   . Allergic rhinitis Sister   . Colon polyps Daughter   . Heart attack Paternal Uncle   . Other Paternal Grandfather        Aortic Valve disorder  . Heart attack Paternal Uncle   . Heart attack Paternal Uncle   . Healthy Daughter    Home Medications: Prior to Admission medications   Medication Sig Start Date End Date Taking? Authorizing Provider  amoxicillin (AMOXIL) 500 MG capsule Take 2,000 mg by mouth as directed. Take 4 capsules 1 hour prior to dental procedure 04/22/16  Yes [provider]  amoxicillin-clavulanate (AUGMENTIN) 875-125 MG tablet Take 1 tablet by mouth 2 (two) times daily.  04/27/20  Yes [provider]  aspirin EC 81 MG tablet Take 1 tablet (81 mg total) by mouth daily. Patient taking differently: Take 81 mg by mouth every evening.   02/27/17  Yes Gerhardt, Jennet Maduro, NP  benazepril (LOTENSIN) 10 MG tablet Take 10 mg by mouth every evening.  04/05/20  Yes [provider]  carbidopa-levodopa (SINEMET IR) 25-100 MG tablet Take 2 tablets by mouth 3 (three) times daily.   Yes [provider]  cetirizine (ZYRTEC) 10 MG tablet Take 10 mg by mouth daily.   Yes [provider]  co-enzyme Q-10 30 MG capsule Take 30 mg by mouth at bedtime.   Yes [provider]  DHA-EPA-VITAMIN E PO Take 1 tablet by mouth every evening.    Yes [provider]  docusate sodium (COLACE) 100 MG capsule Take 200 mg by mouth at bedtime.   Yes [provider]  DULoxetine (CYMBALTA) 30 MG capsule Take 1 capsule by mouth every evening.  03/14/18  Yes [provider]  DULoxetine (CYMBALTA) 60 MG capsule Take 60 mg by mouth every morning.  02/03/20  Yes [provider]  gabapentin (NEURONTIN) 300 MG capsule Take 600 mg by mouth 2 (two) times daily.  04/06/20  Yes [provider]  glucosamine-chondroitin 500-400 MG tablet Take 1 tablet by mouth 2 (two) times daily.   Yes [provider]  hydrochlorothiazide (HYDRODIURIL) 25 MG tablet TAKE 0.5 TABLETS (12.5 MG TOTAL) BY MOUTH DAILY. 05/13/19  Yes Rosalio Macadamia, NP  HYDROcodone-acetaminophen (NORCO/VICODIN) 5-325 MG tablet Take 1 tablet by mouth every 8 (eight) hours as needed for moderate pain or severe pain.  03/14/18  Yes [provider]  melatonin 5 MG TABS Take 5 mg by mouth at bedtime.   Yes [provider]  meloxicam (MOBIC) 7.5 MG tablet Take 1 tablet by mouth 2 (two) times daily. 03/14/18  Yes [provider]  Multiple Vitamins-Minerals (MENS MULTIVITAMIN PLUS PO) Take 1 tablet by mouth daily.   Yes [provider]  Omega-3 Fatty Acids (FISH OIL) 500 MG CAPS Take 1 capsule by mouth daily.    Yes [provider]  simvastatin (ZOCOR) 20 MG tablet TAKE 1 TABLET BY MOUTH EVERY DAY AT 6PM  02/27/17  Yes Rosalio Macadamia, NP  Specialty Vitamins Products (PROSTATE PO) Take 1 tablet by mouth daily.   Yes [provider]  tiZANidine (ZANAFLEX) 4 MG tablet Take 4 mg by mouth 2 (two) times daily. 04/06/20  Yes [provider]    Review of Systems:  Pertinent items noted in HPI and remainder of comprehensive ROS otherwise negative.  Physical Exam: Vitals:   04/28/20 2200 04/28/20 2300 04/28/20 2330 04/28/20 2349  BP: 98/76 99/74  93/68  Pulse: 88 90  84  Resp: Temp:   98.8 F (37.1 C) 98.8 F (37.1 C)  TempSrc:   Oral Oral  SpO2: 93% 92%  94%  Weight:      Height:       General appearance: chronically ill man laying in bed with resting tremor in hands and soft speech that is hesistant Head: mildly swollen and induration noted extending from right upper cheek to right lateral neck; no fluctuance nor purulent drainage appreciated; mild erythema; area is TTP appropriately Eyes: EOMI Ears: no obvious swelling of right ear appreciated Throat: unable to fully evaluate but white spot appreciated in right upper palate Neck: indurated and erythema noted in right lateral neck Lungs: clear to auscultation bilaterally Heart: regular rate and rhythm and S1, S2 normal Abdomen: soft, ND, NT, BS present Extremities: no edema Skin: normal Neurologic: resting tremor in UE bilaterally; does move all 4 extremities  Labs on Admission:  I have personally reviewed following labs and imaging studies Results for orders placed or performed during the hospital encounter of 04/28/20 (from the past 24 hour(s))  Lactic acid, plasma     Status: Abnormal   Collection Time: 04/28/20  6:21 PM  Result Value Ref Range   Lactic Acid, Venous 2.0 (HH) 0.5 - 1.9 mmol/L  Comprehensive metabolic panel     Status: Abnormal   Collection Time: 04/28/20  6:32 PM  Result Value Ref Range   Sodium 138 135 - 145 mmol/L   Potassium 3.6 3.5 - 5.1 mmol/L   Chloride 96 (L) 98 - 111 mmol/L    CO2 30 22 - 32 mmol/L   Glucose, Bld 115 (H) 70 - 99 mg/dL   BUN 23 8 - 23 mg/dL   Creatinine, Ser 1.61 0.61 - 1.24 mg/dL   Calcium 9.3 8.9 - 09.6 mg/dL  Total Protein 7.2 6.5 - 8.1 g/dL   Albumin 3.8 3.5 - 5.0 g/dL   AST 23 15 - 41 U/L   ALT 15 0 - 44 U/L   Alkaline Phosphatase 50 38 - 126 U/L   Total Bilirubin 1.1 0.3 - 1.2 mg/dL   GFR calc non Af Amer >60 >60 mL/min   GFR calc Af Amer >60 >60 mL/min   Anion gap 12 5 - 15  CBC with Differential     Status: Abnormal   Collection Time: 04/28/20  6:32 PM  Result Value Ref Range   WBC 11.4 (H) 4.0 - 10.5 K/uL   RBC 4.75 4.22 - 5.81 MIL/uL   Hemoglobin 14.6 13.0 - 17.0 g/dL   HCT 00.8 39 - 52 %   MCV 91.8 80.0 - 100.0 fL   MCH 30.7 26.0 - 34.0 pg   MCHC 33.5 30.0 - 36.0 g/dL   RDW 67.6 19.5 - 09.3 %   Platelets 161 150 - 400 K/uL   nRBC 0.0 0.0 - 0.2 %   Neutrophils Relative % 79 %   Neutro Abs 9.0 (H) 1.7 - 7.7 K/uL   Lymphocytes Relative 13 %   Lymphs Abs 1.5 0.7 - 4.0 K/uL   Monocytes Relative 7 %   Monocytes Absolute 0.8 0 - 1 K/uL   Eosinophils Relative 1 %   Eosinophils Absolute 0.1 0 - 0 K/uL   Basophils Relative 0 %   Basophils Absolute 0.0 0 - 0 K/uL   Immature Granulocytes 0 %   Abs Immature Granulocytes 0.03 0.00 - 0.07 K/uL  Lactic acid, plasma     Status: None   Collection Time: 04/28/20  8:43 PM  Result Value Ref Range   Lactic Acid, Venous 1.4 0.5 - 1.9 mmol/L  SARS Coronavirus 2 by RT PCR (hospital order, performed in [facility] hospital lab) Nasopharyngeal Nasopharyngeal Swab     Status: None   Collection Time: 04/28/20  9:41 PM   Specimen: Nasopharyngeal Swab  Result Value Ref Range   SARS Coronavirus 2 NEGATIVE NEGATIVE  Urinalysis, Routine w reflex microscopic     Status: Abnormal   Collection Time: 04/29/20 12:00 AM  Result Value Ref Range   Color, Urine YELLOW YELLOW   APPearance CLEAR CLEAR   Specific Gravity, Urine 1.020 1.005 - 1.030   pH 5.0 5.0 - 8.0   Glucose, UA NEGATIVE NEGATIVE  mg/dL   Hgb urine dipstick NEGATIVE NEGATIVE   Bilirubin Urine NEGATIVE NEGATIVE   Ketones, ur NEGATIVE NEGATIVE mg/dL   Protein, ur NEGATIVE NEGATIVE mg/dL   Nitrite NEGATIVE NEGATIVE   Leukocytes,Ua NEGATIVE NEGATIVE   RBC / HPF 0-5 0 - 5 RBC/hpf   WBC, UA 0-5 0 - 5 WBC/hpf   Bacteria, UA FEW (A) NONE SEEN   Squamous Epithelial / LPF 0-5 0 - 5   Mucus PRESENT    Hyaline Casts, UA PRESENT    Sperm, UA PRESENT      Radiological Exams on Admission: CT Soft Tissue Neck W Contrast  Result Date: 04/28/2020 CLINICAL DATA:  Initial evaluation for acute cellulitis. EXAM: CT NECK WITH CONTRAST TECHNIQUE: Multidetector CT imaging of the neck was performed using the standard protocol following the bolus administration of intravenous contrast. CONTRAST:  52mL OMNIPAQUE IOHEXOL 300 MG/ML  SOLN COMPARISON:  None. FINDINGS: Pharynx and larynx: Oral cavity within normal limits without discrete mass or collection. Palatine tonsils symmetric and within normal limits. No tonsillar or peritonsillar abscess. Mild mucosal edema noted within  the right oropharynx related to the acute inflammatory process within the right face. Remainder of the oropharynx and nasopharynx otherwise within normal limits. Trace retropharyngeal effusion, likely reactive. No frank retropharyngeal abscess. Epiglottis normal. Vallecula clear. Remainder of the hypopharynx and supraglottic larynx within normal limits. True cords grossly within normal limits allowing for patient positioning. Subglottic airway clear. Salivary glands: Asymmetric enlargement with irregularity and hyperenhancement involving the right parotid gland, consistent with acute parotitis. No obstructive stone seen within Stensen's duct. Associated swelling with inflammatory stranding throughout the adjacent right face, with involvement of the right parotid, masticator, parapharyngeal, and submandibular spaces. Additional inflammatory stranding extends inferiorly along the  anterior aspect of the right neck and upper chest wall. No discrete abscess or drainable fluid collection. Left parotid gland within normal limits. Submandibular glands within normal limits. Punctate stone noted at the right floor of mouth, likely in right Wharton's duct. No associated ductal dilatation. Thyroid: Normal. Lymph nodes: Mildly prominent right level II lymph nodes measure up to 1 cm, likely reactive. No other pathologically enlarged lymph nodes identified within the neck. Vascular: Normal intravascular enhancement seen throughout the neck. Few small foci of gas lucency within the right submandibular space likely lie within the venous system, suspected be related IV access. Mild-to-moderate atheromatous change about the aortic arch and carotid siphons. Limited intracranial: Unremarkable. Visualized orbits: Unremarkable. Mastoids and visualized paranasal sinuses: Paranasal sinuses are largely clear. Small chronic appearing bilateral mastoid effusions, of doubtful significance. Middle ear cavities are well pneumatized and free of fluid. Skeleton: No acute osseous abnormality. No discrete or worrisome osseous lesions. Moderate multilevel cervical spondylosis, most notable at C5-6 and C6-7. Median sternotomy wires noted. Upper chest: Visualized upper chest demonstrates no acute finding. Scattered atelectatic changes noted within the visualized lungs. Other: None. IMPRESSION: 1. Findings consistent with acute right parotitis with associated regional cellulitis as above. No obstructive stone, abscess, or other complication identified. 2. Punctate stone at the right floor of mouth, likely in the right Wharton's duct. No associated ductal dilatation. 3. Mildly prominent right level II lymph nodes, likely reactive. Electronically Signed   By: Rise Mu M.D.   On: 04/28/2020 20:52   CT Soft Tissue Neck W Contrast  Final Result      Consults called:  n/a   EKG: Independently reviewed. SR with  PVCs   Lewie Chamber, MD [facility] Pager: Secure chat  If 7PM-7AM, please contact night-coverage [email or web address] Use universal [employer] password for that web site. If you do not have the password, please call the hospital operator.  04/29/2020, 1:10 AM

## 2020-04-29 NOTE — Assessment & Plan Note (Addendum)
-   follows outpatient with neurology; last seen 03/21; note reviewed in care everywhere -Does not respond to Sinemet, however wife insists for patient to remain on -Patient is bedbound at this time

## 2020-04-29 NOTE — Plan of Care (Signed)

## 2020-04-29 NOTE — Progress Notes (Signed)
[facility] PROGRESS NOTE   Jeffery [name] SWF:[id number] DOB: 23-May-1956 DOA: 04/28/2020  PCP: Joycelyn Rua, MD  Brief History/Interval Summary: 64 yo male with PMH corticobasal syndrome (Parkinsonian), AVR (bioprosthetic), CAD s/p CABG x 1 (LIMA to LAD), HTN, HLD who presented to the ER with his wife due to complaints of worsening pain and swelling in his right face down to his right neck.  Symptoms have been ongoing for about 5 days.  Apparently seen by an outpatient provider who started the patient on Augmentin but due to worsening symptoms and fever at home he presented to the emergency department.  CT scan did not show any abscess but did show acute right parotitis.  Patient was hospitalized for further management.  Reason for Visit: Acute right parotitis with the surrounding cellulitis  Consultants: Phone discussion with ENT, Dr. Jearld Fenton  Procedures: None  Antibiotics: Anti-infectives (From admission, onward)   Start     Dose/Rate Route Frequency Ordered Stop   04/29/20 0200  Ampicillin-Sulbactam (UNASYN) 3 g in sodium chloride 0.9 % 100 mL IVPB     Discontinue     3 g 200 mL/hr over 30 Minutes Intravenous Every 6 hours 04/28/20 2355     04/28/20 1900  Ampicillin-Sulbactam (UNASYN) 3 g in sodium chloride 0.9 % 100 mL IVPB        3 g 200 mL/hr over 30 Minutes Intravenous STAT 04/28/20 1857 04/28/20 2046      Subjective/Interval History: Patient not very communicative due to his history of Parkinson's disease.  He does mention pain in the right side of the face.  He wants something to eat and drink.  His wife and daughter at the bedside.  ROS: Denies any nausea vomiting.    Assessment/Plan:  Acute right parotitis with surrounding cellulitis Follow-up on cultures.  Patient has been started on Unasyn.  CT findings discussed with Dr. Jearld Fenton with ENT.  No role for surgical intervention.  Heating pad and NSAIDs recommended along with hydration.  His renal function is  normal.  We will give him Motrin for a day or so.  No clear evidence for sepsis.  1.4 from a high of 2.0.  WBC normal this morning.  Was 7.4 yesterday.  History of Parkinson's/Corticobasal syndrome Followed by neurology.  Last seen in March.  Noted to be on Sinemet which is being continued.  He apparently takes Zofran sometime before his Sinemet which has been ordered.  Speech therapy consulted for swallow evaluation.  Hypokalemia Will be repleted aggressively.  Magnesium 1.8.  Essential hypertension Blood pressure noted to be borderline low in the emergency department.  His antihypertensives are on hold.   DVT Prophylaxis: Subcutaneous heparin Code Status: Full code Family Communication: Discussed with the patient and his family Disposition Plan: PT and OT evaluation.  Hopefully back home in improved.  Status is: Inpatient  Remains inpatient appropriate because:Ongoing active pain requiring inpatient pain management and IV treatments appropriate due to intensity of illness or inability to take PO   Dispo: The patient is from: Home              Anticipated d/c is to: Home              Anticipated d/c date is: 2 days              Patient currently is not medically stable to d/c.     Medications:  Scheduled: . carbidopa-levodopa  2 tablet Oral TID PC  . DULoxetine  30 mg Oral QPM  . DULoxetine  60 mg Oral Daily  . heparin  5,000 Units Subcutaneous Q8H  . melatonin  5 mg Oral QHS  . ondansetron  8 mg Oral TID  . sodium chloride flush  3 mL Intravenous Q12H   Continuous: . sodium chloride 75 mL/hr at 04/29/20 0600  . ampicillin-sulbactam (UNASYN) IV 3 g (04/29/20 8937)  . potassium chloride 10 mEq (04/29/20 0907)   DSK:AJGOTLXB injection, oxyCODONE   Objective:  Vital Signs  Vitals:   04/28/20 2300 04/28/20 2330 04/28/20 2349 04/29/20 0547  BP: 99/74  93/68 117/78  Pulse: 90  84 81  Resp: 17  16 16   Temp:  98.8 F (37.1 C) 98.8 F (37.1 C) 97.6 F (36.4 C)    TempSrc:  Oral Oral Oral  SpO2: 92%  94% 95%  Weight:      Height:        Intake/Output Summary (Last 24 hours) at 04/29/2020 1050 Last data filed at 04/29/2020 0855 Gross per 24 hour  Intake 525.49 ml  Output 550 ml  Net -24.51 ml   Filed Weights   04/28/20 1841  Weight: 90.7 kg    General appearance: Awake alert.  In no distress Swelling of the right side of the face is noted.  He does have enlarged right parotid gland.  Tender to palpation.  Mild erythema is noted.  Slightly warm to touch.  Erythema seems to be extending down to his neck. Resp: Clear to auscultation bilaterally.  Normal effort Cardio: S1-S2 is normal regular.  No S3-S4.  No rubs murmurs or bruit GI: Abdomen is soft.  Nontender nondistended.  Bowel sounds are present normal.  No masses organomegaly Extremities: No edema.  Able to move all his extremities but limited due to Parkinson Neurologic: Wake alert.  No obvious focal neurological deficits.  Rigidity noted due to Parkinson's.   Lab Results:  Data Reviewed: I have personally reviewed following labs and imaging studies  CBC: Recent Labs  Lab 04/28/20 1832 04/29/20 0308  WBC 11.4* 8.3  NEUTROABS 9.0* 5.8  HGB 14.6 12.4*  HCT 43.6 37.3*  MCV 91.8 92.8  PLT 161 143*    Basic Metabolic Panel: Recent Labs  Lab 04/28/20 1832 04/29/20 0308  NA 138 137  K 3.6 2.8*  CL 96* 103  CO2 30 26  GLUCOSE 115* 97  BUN 23 18  CREATININE 1.13 0.89  CALCIUM 9.3 8.4*  MG  --  1.8    GFR: Estimated Creatinine Clearance: 97.5 mL/min (by C-G formula based on SCr of 0.89 mg/dL).  Liver Function Tests: Recent Labs  Lab 04/28/20 1832  AST 23  ALT 15  ALKPHOS 50  BILITOT 1.1  PROT 7.2  ALBUMIN 3.8     Recent Results (from the past 240 hour(s))  SARS Coronavirus 2 by RT PCR (hospital order, performed in [facility] hospital lab) Nasopharyngeal Nasopharyngeal Swab     Status: None   Collection Time: 04/28/20  9:41 PM   Specimen: Nasopharyngeal  Swab  Result Value Ref Range Status   SARS Coronavirus 2 NEGATIVE NEGATIVE Final    Comment: (NOTE) SARS-CoV-2 target nucleic acids are NOT DETECTED.  The SARS-CoV-2 RNA is generally detectable in upper and lower respiratory specimens during the acute phase of infection. The lowest concentration of SARS-CoV-2 viral copies this assay can detect is 250 copies / mL. A negative result does not preclude SARS-CoV-2 infection and should not be used as the sole basis for treatment  or other patient management decisions.  A negative result may occur with improper specimen collection / handling, submission of specimen other than nasopharyngeal swab, presence of viral mutation(s) within the areas targeted by this assay, and inadequate number of viral copies (<250 copies / mL). A negative result must be combined with clinical observations, patient history, and epidemiological information.  Fact Sheet for Patients:   BoilerBrush.com.cy  Fact Sheet for Healthcare Providers: https://pope.com/  This test is not yet approved or  cleared by the [place] FDA and has been authorized for detection and/or diagnosis of SARS-CoV-2 by FDA under an Emergency Use Authorization (EUA).  This EUA will remain in effect (meaning this test can be used) for the duration of the COVID-19 declaration under Section 564(b)(1) of the Act, 21 U.S.C. section 360bbb-3(b)(1), unless the authorization is terminated or revoked sooner.  Performed at [facility], [address], [place], [place] [address]       Radiology Studies: CT Soft Tissue Neck W Contrast  Result Date: 04/28/2020 CLINICAL DATA:  Initial evaluation for acute cellulitis. EXAM: CT NECK WITH CONTRAST TECHNIQUE: Multidetector CT imaging of the neck was performed using the standard protocol following the bolus administration of intravenous contrast. CONTRAST:  69mL OMNIPAQUE IOHEXOL 300  MG/ML  SOLN COMPARISON:  None. FINDINGS: Pharynx and larynx: Oral cavity within normal limits without discrete mass or collection. Palatine tonsils symmetric and within normal limits. No tonsillar or peritonsillar abscess. Mild mucosal edema noted within the right oropharynx related to the acute inflammatory process within the right face. Remainder of the oropharynx and nasopharynx otherwise within normal limits. Trace retropharyngeal effusion, likely reactive. No frank retropharyngeal abscess. Epiglottis normal. Vallecula clear. Remainder of the hypopharynx and supraglottic larynx within normal limits. True cords grossly within normal limits allowing for patient positioning. Subglottic airway clear. Salivary glands: Asymmetric enlargement with irregularity and hyperenhancement involving the right parotid gland, consistent with acute parotitis. No obstructive stone seen within Stensen's duct. Associated swelling with inflammatory stranding throughout the adjacent right face, with involvement of the right parotid, masticator, parapharyngeal, and submandibular spaces. Additional inflammatory stranding extends inferiorly along the anterior aspect of the right neck and upper chest wall. No discrete abscess or drainable fluid collection. Left parotid gland within normal limits. Submandibular glands within normal limits. Punctate stone noted at the right floor of mouth, likely in right Wharton's duct. No associated ductal dilatation. Thyroid: Normal. Lymph nodes: Mildly prominent right level II lymph nodes measure up to 1 cm, likely reactive. No other pathologically enlarged lymph nodes identified within the neck. Vascular: Normal intravascular enhancement seen throughout the neck. Few small foci of gas lucency within the right submandibular space likely lie within the venous system, suspected be related IV access. Mild-to-moderate atheromatous change about the aortic arch and carotid siphons. Limited intracranial:  Unremarkable. Visualized orbits: Unremarkable. Mastoids and visualized paranasal sinuses: Paranasal sinuses are largely clear. Small chronic appearing bilateral mastoid effusions, of doubtful significance. Middle ear cavities are well pneumatized and free of fluid. Skeleton: No acute osseous abnormality. No discrete or worrisome osseous lesions. Moderate multilevel cervical spondylosis, most notable at C5-6 and C6-7. Median sternotomy wires noted. Upper chest: Visualized upper chest demonstrates no acute finding. Scattered atelectatic changes noted within the visualized lungs. Other: None. IMPRESSION: 1. Findings consistent with acute right parotitis with associated regional cellulitis as above. No obstructive stone, abscess, or other complication identified. 2. Punctate stone at the right floor of mouth, likely in the right Wharton's duct. No associated ductal dilatation.  3. Mildly prominent right level II lymph nodes, likely reactive. Electronically Signed   By: Rise MuBenjamin  McClintock M.D.   On: 04/28/2020 20:52       LOS: 1 day   Osvaldo ShipperGokul Keesha Pellum  [facility] Pager on [email or web address]  04/29/2020, 10:50 AM

## 2020-04-29 NOTE — Assessment & Plan Note (Signed)
-   hypotensive in the ER - hold meds and resume as able

## 2020-04-29 NOTE — Assessment & Plan Note (Addendum)
-  Right-sided parotitis with surrounding cellulitis.  No obvious abscess.  No purulent drainage at home prior to admission.  Cheek and neck are indurated with appropriate level of pain, not out of proportion -CT reveals "Findings consistent with acute right parotitis with associated regional cellulitis as above. No obstructive stone, abscess, or other complication identified." - consider surgery consult in am to follow along in case of need for further intervention/testing - continue Unasyn 3 g IV q6h - morphine for pain control; monitor BP as already low/normal in ER

## 2020-04-29 NOTE — Plan of Care (Signed)

## 2020-04-30 DIAGNOSIS — E876 Hypokalemia: Secondary | ICD-10-CM

## 2020-04-30 DIAGNOSIS — I10 Essential (primary) hypertension: Secondary | ICD-10-CM

## 2020-04-30 LAB — BASIC METABOLIC PANEL
Anion gap: 11 (ref 5–15)
BUN: 15 mg/dL (ref 8–23)
CO2: 24 mmol/L (ref 22–32)
Calcium: 8.6 mg/dL — ABNORMAL LOW (ref 8.9–10.3)
Chloride: 103 mmol/L (ref 98–111)
Creatinine, Ser: 0.77 mg/dL (ref 0.61–1.24)
GFR calc Af Amer: >60 mL/min (ref >60)
GFR calc non Af Amer: >60 mL/min (ref >60)
Glucose, Bld: 84 mg/dL (ref 70–99)
Potassium: 3.3 mmol/L — ABNORMAL LOW (ref 3.5–5.1)
Sodium: 138 mmol/L (ref 135–145)

## 2020-04-30 LAB — CBC
HCT: 37.1 % — ABNORMAL LOW (ref 39.0–52.0)
Hemoglobin: 12.8 g/dL — ABNORMAL LOW (ref 13.0–17.0)
MCH: 31.3 pg (ref 26.0–34.0)
MCHC: 34.5 g/dL (ref 30.0–36.0)
MCV: 90.7 fL (ref 80.0–100.0)
Platelets: 155 10*3/uL (ref 150–400)
RBC: 4.09 MIL/uL — ABNORMAL LOW (ref 4.22–5.81)
RDW: 12.1 % (ref 11.5–15.5)
WBC: 5.1 10*3/uL (ref 4.0–10.5)
nRBC: 0 % (ref 0.0–0.2)

## 2020-04-30 LAB — MAGNESIUM: Magnesium: 1.9 mg/dL (ref 1.7–2.4)

## 2020-04-30 MED ORDER — LIDOCAINE 5 % EX PTCH
1.0000 | MEDICATED_PATCH | CUTANEOUS | Status: DC
  Administered 2020-04-30 – 2020-05-05 (×6): 1 via TRANSDERMAL
  Filled 2020-04-30 (×6): qty 1

## 2020-04-30 MED ORDER — ENSURE ENLIVE PO LIQD
237.0000 mL | Freq: Two times a day (BID) | ORAL | Status: DC
  Administered 2020-05-04 – 2020-05-05 (×3): 237 mL via ORAL

## 2020-04-30 MED ORDER — POTASSIUM CHLORIDE CRYS ER 20 MEQ PO TBCR
40.0000 meq | EXTENDED_RELEASE_TABLET | Freq: Once | ORAL | Status: AC
  Administered 2020-04-30: 40 meq via ORAL
  Filled 2020-04-30: qty 2

## 2020-04-30 MED ORDER — SACCHAROMYCES BOULARDII 250 MG PO CAPS
250.0000 mg | ORAL_CAPSULE | Freq: Two times a day (BID) | ORAL | Status: DC
  Administered 2020-04-30 – 2020-05-05 (×11): 250 mg via ORAL
  Filled 2020-04-30 (×11): qty 1

## 2020-04-30 MED ORDER — ADULT MULTIVITAMIN W/MINERALS CH
1.0000 | ORAL_TABLET | Freq: Every day | ORAL | Status: DC
  Administered 2020-04-30 – 2020-05-05 (×6): 1 via ORAL
  Filled 2020-04-30 (×6): qty 1

## 2020-04-30 NOTE — Plan of Care (Signed)
Plan of care reviewed and discussed with the patient. 

## 2020-04-30 NOTE — Evaluation (Signed)
Occupational Therapy Evaluation Patient Details Name: Jeffery [name] MRN: [id number] DOB: [birth year] Today's Date: 04/30/2020    History of Present Illness 64 yo male with PMH corticobasal syndrome (Parkinsonian), AVR (bioprosthetic), CAD s/p CABG x 1 (LIMA to LAD), HTN, HLD who presented to the ER with his wife due to complaints of worsening pain and swelling in his right face down to his right neck. CT scan did not show any abscess but did show acute right parotitis with surrounding cellulitis.    Clinical Impression   Pt admitted with the above. Pt currently with functional limitations due to the deficits listed below (see OT Problem List).  Pt will benefit from skilled OT to increase their safety and independence with ADL and functional mobility for ADL to facilitate discharge to venue listed below.   Pts wife is VERY supportive. OT did discuss with pt and wife options for urine management.       Follow Up Recommendations  Home health OT;Supervision/Assistance - 24 hour    Equipment Recommendations  None recommended by OT    Recommendations for Other Services       Precautions / Restrictions Precautions Precautions: Fall Restrictions Weight Bearing Restrictions: No      Mobility Bed Mobility Overal bed mobility: Needs Assistance Bed Mobility: Supine to Sit     Supine to sit: Max assist Sit to supine: +2 for physical assistance;+2 for safety/equipment;Max assist   General bed mobility comments: Pt required max assist to control lowering trunk to Lt side and raise LE's into bed. +2 assist required to reposition in bed.   Transfers Overall transfer level: Needs assistance Equipment used: 2 person hand held assist Transfers: Sit to/from [employer] Sit to Stand: Max assist;+2 physical assistance;+2 safety/equipment Stand pivot transfers: Max assist;+2 physical assistance;+2 safety/equipment;Total assist       General transfer comment: Pt required +2  assist for Sit<>Stand from recliner with 2 HHA and RW. 2HHA provided for stand pivot to [facility] and pt very limited by poor LE coordination to step. MAX assist to prevent fall. Pt required mod assist +2 to rise with Stedy from [facility] to return to sit EOB.    Balance Overall balance assessment: Needs assistance Sitting-balance support: Feet supported;Bilateral upper extremity supported Sitting balance-Leahy Scale: Fair     Standing balance support: Bilateral upper extremity supported Standing balance-Leahy Scale: Zero                             ADL either performed or assessed with clinical judgement   ADL Overall ADL's : Needs assistance/impaired Eating/Feeding: Minimal assistance;Sitting   Grooming: Minimal assistance;Sitting   Upper Body Bathing: Min guard;Sitting   Lower Body Bathing: Sit to/from stand;+2 for physical assistance;+2 for safety/equipment;Maximal assistance   Upper Body Dressing : Minimal assistance;Sitting   Lower Body Dressing: Maximal assistance;Sit to/from stand;Cueing for safety;Cueing for sequencing;Cueing for compensatory techniques;+2 for physical assistance;+2 for safety/equipment   Toilet Transfer: Maximal assistance;+2 for physical assistance;+2 for safety/equipment;Stand-pivot   Toileting- Clothing Manipulation and Hygiene: +2 for physical assistance;+2 for safety/equipment;Maximal assistance               Vision Patient Visual Report: No change from baseline       Perception     Praxis      Pertinent Vitals/Pain Pain Assessment: No/denies pain     Hand Dominance     Extremity/Trunk Assessment Upper Extremity Assessment Upper Extremity Assessment: Generalized weakness  Lower Extremity Assessment Lower Extremity Assessment: Generalized weakness;RLE deficits/detail;LLE deficits/detail RLE Deficits / Details: pt with weakness and unable to fully extend at knees and hips in standing on bil LEs. LLE Deficits / Details: pt with  weakness and unable to fully extend at knees and hips in standing on bil LEs.   Cervical / Trunk Assessment Cervical / Trunk Assessment: Kyphotic   Communication Communication Communication: HOH;Expressive difficulties (low volume for voice)   Cognition Arousal/Alertness: Awake/alert Behavior During Therapy: WFL for tasks assessed/performed Overall Cognitive Status: Within Functional Limits for tasks assessed                                     General Comments       Exercises     Shoulder Instructions      Home Living Family/patient expects to be discharged to:: Private residence Living Arrangements: Spouse/significant other Available Help at Discharge: Family Type of Home: House Home Access: Ramped entrance     Home Layout: One level     Bathroom Shower/Tub: Walk-in shower (roll in)   Bathroom Toilet: Handicapped height Bathroom Accessibility: Yes   Home Equipment: Hospital bed;Walker - 2 wheels;Bedside commode;Wheelchair - power;Shower seat - built in   Additional Comments: pt's wife is helping him with all mobility and ADL's      Prior Functioning/Environment Level of Independence: Needs assistance  Gait / Transfers Assistance Needed: ~6 months ago pt was ambulating with RW, over last 6 weeks he has declined more and required 2 person HHA to take several small steps or transfer. pt's wife is using rollator to roll him into bathroom. He has been non-ambulatory for a little more than 2 weeks. ADL's / Homemaking Assistance Needed: Pt requires assist with all bathing, dressing, and self care from wife.             OT Problem List: Decreased strength;Decreased range of motion;Decreased activity tolerance;Impaired balance (sitting and/or standing)      OT Treatment/Interventions: Self-care/ADL training;Patient/family education;Therapeutic activities;DME and/or AE instruction    OT Goals(Current goals can be found in the care plan section) Acute  Rehab OT Goals Patient Stated Goal: be able to assist with standing more  OT Frequency: Min 2X/week   Barriers to D/C:            Co-evaluation              AM-PAC OT "6 Clicks" Daily Activity     Outcome Measure Help from another person eating meals?: A Little Help from another person taking care of personal grooming?: A Little Help from another person toileting, which includes using toliet, bedpan, or urinal?: Total Help from another person bathing (including washing, rinsing, drying)?: A Lot Help from another person to put on and taking off regular upper body clothing?: A Lot Help from another person to put on and taking off regular lower body clothing?: Total 6 Click Score: 12   End of Session Equipment Utilized During Treatment: Gait belt Nurse Communication: Mobility status  Activity Tolerance: Patient tolerated treatment well Patient left: in chair;with call bell/phone within reach;with chair alarm set;with family/visitor present  OT Visit Diagnosis: Unsteadiness on feet (R26.81);Other abnormalities of gait and mobility (R26.89);Muscle weakness (generalized) (M62.81);History of falling (Z91.81)                Time: 1035-1050 OT Time Calculation (min): 15 min Charges:  OT General Charges $OT Visit: 1  Visit OT Evaluation $OT Eval Moderate Complexity: 1 Mod  Lise Auer, [place] [facility] Services [phone] Office- [phone]     Jazmene Racz, Karin Golden D 04/30/2020, 3:06 PM

## 2020-04-30 NOTE — Progress Notes (Addendum)
[facility] PROGRESS NOTE   Jeffery [name] QAS:[id number] DOB: [birth year] DOA: 04/28/2020  PCP: Joycelyn Rua, MD  Brief History/Interval Summary: 64 yo male with PMH corticobasal syndrome (Parkinsonian), AVR (bioprosthetic), CAD s/p CABG x 1 (LIMA to LAD), HTN, HLD who presented to the ER with his wife due to complaints of worsening pain and swelling in his right face down to his right neck.  Symptoms have been ongoing for about 5 days.  Apparently seen by an outpatient provider who started the patient on Augmentin but due to worsening symptoms and fever at home he presented to the emergency department.  CT scan did not show any abscess but did show acute right parotitis.  Patient was hospitalized for further management.  Reason for Visit: Acute right parotitis with the surrounding cellulitis  Consultants: Phone discussion with ENT, Dr. Jearld Fenton  Procedures: None  Antibiotics: Anti-infectives (From admission, onward)   Start     Dose/Rate Route Frequency Ordered Stop   04/29/20 0200  Ampicillin-Sulbactam (UNASYN) 3 g in sodium chloride 0.9 % 100 mL IVPB     Discontinue     3 g 200 mL/hr over 30 Minutes Intravenous Every 6 hours 04/28/20 2355     04/28/20 1900  Ampicillin-Sulbactam (UNASYN) 3 g in sodium chloride 0.9 % 100 mL IVPB        3 g 200 mL/hr over 30 Minutes Intravenous STAT 04/28/20 1857 04/28/20 2046      Subjective/Interval History: Patient mentions that the face on the right side feels better.  Not as tender.  Not as swollen.  He however does report body aches and difficulty sleeping.  His wife is at the bedside.  ROS: Denies any nausea or vomiting.    Assessment/Plan:  Acute right parotitis with surrounding cellulitis Patient was started on Unasyn.  He was started on Augmentin but took it just for 1 day before he presented to the hospital.  Will not consider that a failure of treatment.   Patient underwent CT scan which did not show any abscess.  The  findings were discussed with Dr. Jearld Fenton with ENT.  Apart from antibiotics, heating pad and NSAIDs were also recommended.  He was started on Motrin.  Renal function remained stable.  Lactic acid level was 1.4.  WBC is normal.  He is experiencing some loose stools presumably due to antibacterials.  Start him on probiotics.  History of Parkinson's/Corticobasal syndrome Followed by neurology.  Last seen in March.  Noted to be on Sinemet which is being continued.  Sometimes he apparently takes Zofran before his Sinemet.  Speech therapy consulted for swallow evaluation.  Hypokalemia Potassium level improved but still low this morning.  Magnesium 1.9.  Potassium will be repleted.  Essential hypertension Blood pressure noted to be borderline low in the emergency department.  His antihypertensives are on hold.  Blood pressure stable.   DVT Prophylaxis: Subcutaneous heparin Code Status: Full code Family Communication: Discussed with the patient and his wife. Disposition Plan: PT and OT evaluation.  Hopefully back home when improved.  Status is: Inpatient  Remains inpatient appropriate because:IV treatments appropriate due to intensity of illness or inability to take PO   Dispo:  Patient From: Home  Planned Disposition: Home  Expected discharge date: 05/02/20  Medically stable for discharge: No        Medications:  Scheduled: . carbidopa-levodopa  2 tablet Oral TID PC  . DULoxetine  30 mg Oral QPM  . DULoxetine  60 mg Oral Daily  .  heparin  5,000 Units Subcutaneous Q8H  . ibuprofen  600 mg Oral TID with meals  . lidocaine  1 patch Transdermal Q24H  . melatonin  5 mg Oral QHS  . ondansetron  8 mg Oral TID  . pantoprazole  40 mg Oral Q1200  . saccharomyces boulardii  250 mg Oral BID  . sodium chloride flush  3 mL Intravenous Q12H   Continuous: . sodium chloride 30 mL/hr at 04/30/20 0932  . ampicillin-sulbactam (UNASYN) IV 3 g (04/30/20 0754)   SWH:QPRFFMBWG, morphine injection,  oxyCODONE   Objective:  Vital Signs  Vitals:   04/29/20 0547 04/29/20 1331 04/29/20 2138 04/30/20 0546  BP: 117/78 (!) 122/87 111/84 (!) 132/96  Pulse: 81 79 79 78  Resp: 16 17 18 16   Temp: 97.6 F (36.4 C) 97.6 F (36.4 C) 97.7 F (36.5 C) 98.3 F (36.8 C)  TempSrc: Oral Oral Oral Oral  SpO2: 95% 98% 99% 97%  Weight:      Height:        Intake/Output Summary (Last 24 hours) at 04/30/2020 1057 Last data filed at 04/30/2020 1009 Gross per 24 hour  Intake 2521.81 ml  Output 1050 ml  Net 1471.81 ml   Filed Weights   04/28/20 1841  Weight: 90.7 kg    General appearance: Awake alert.  In no distress Swelling in the right side of his face seems to be improving.  Not as erythematous today compared to yesterday.  Less warm to touch.  Less tender. Resp: Clear to auscultation bilaterally.  Normal effort Cardio: S1-S2 is normal regular.  No S3-S4.  No rubs murmurs or bruit GI: Abdomen is soft.  Nontender nondistended.  Bowel sounds are present normal.  No masses organomegaly Extremities: No edema.   Neurologic: Awake alert.  Rigidity due to Parkinson's.  No other focal deficits.   Lab Results:  Data Reviewed: I have personally reviewed following labs and imaging studies  CBC: Recent Labs  Lab 04/28/20 1832 04/29/20 0308 04/30/20 0258  WBC 11.4* 8.3 5.1  NEUTROABS 9.0* 5.8  --   HGB 14.6 12.4* 12.8*  HCT 43.6 37.3* 37.1*  MCV 91.8 92.8 90.7  PLT 161 143* 155    Basic Metabolic Panel: Recent Labs  Lab 04/28/20 1832 04/29/20 0308 04/29/20 1356 04/30/20 0258  NA 138 137  --  138  K 3.6 2.8* 3.8 3.3*  CL 96* 103  --  103  CO2 30 26  --  24  GLUCOSE 115* 97  --  84  BUN 23 18  --  15  CREATININE 1.13 0.89  --  0.77  CALCIUM 9.3 8.4*  --  8.6*  MG  --  1.8  --  1.9    GFR: Estimated Creatinine Clearance: 108.5 mL/min (by C-G formula based on SCr of 0.77 mg/dL).  Liver Function Tests: Recent Labs  Lab 04/28/20 1832  AST 23  ALT 15  ALKPHOS 50    BILITOT 1.1  PROT 7.2  ALBUMIN 3.8     Recent Results (from the past 240 hour(s))  Blood culture (routine x 2)     Status: None (Preliminary result)   Collection Time: 04/28/20  6:26 PM   Specimen: BLOOD LEFT HAND  Result Value Ref Range Status   Specimen Description   Final    BLOOD LEFT HAND Performed at [facility], [address] [place], [place]    Special Requests   Final    BOTTLES DRAWN AEROBIC ONLY Blood  Culture adequate volume Performed at [facility], [address], [place], [place] [address]    Culture   Final    NO GROWTH 2 DAYS Performed at [facility], [address] [place], [place] [address]    Report Status PENDING  Incomplete  Blood culture (routine x 2)     Status: None (Preliminary result)   Collection Time: 04/28/20  6:32 PM   Specimen: BLOOD  Result Value Ref Range Status   Specimen Description   Final    BLOOD RIGHT ANTECUBITAL Performed at [facility], [address], [place], [place] [address]    Special Requests   Final    BOTTLES DRAWN AEROBIC AND ANAEROBIC Blood Culture adequate volume Performed at [facility], [address] [place], [place] [address]    Culture   Final    NO GROWTH 2 DAYS Performed at [facility], [address], [place], [place] [address]    Report Status PENDING  Incomplete  SARS Coronavirus 2 by RT PCR (hospital order, performed in [facility] hospital lab) Nasopharyngeal Nasopharyngeal Swab     Status: None   Collection Time: 04/28/20  9:41 PM   Specimen: Nasopharyngeal Swab  Result Value Ref Range Status   SARS Coronavirus 2 NEGATIVE NEGATIVE Final    Comment: (NOTE) SARS-CoV-2 target nucleic acids are NOT DETECTED.  The SARS-CoV-2 RNA is generally detectable in upper and lower respiratory specimens during the acute phase of infection. The lowest concentration of SARS-CoV-2 viral copies this assay can  detect is 250 copies / mL. A negative result does not preclude SARS-CoV-2 infection and should not be used as the sole basis for treatment or other patient management decisions.  A negative result may occur with improper specimen collection / handling, submission of specimen other than nasopharyngeal swab, presence of viral mutation(s) within the areas targeted by this assay, and inadequate number of viral copies (<250 copies / mL). A negative result must be combined with clinical observations, patient history, and epidemiological information.  Fact Sheet for Patients:   BoilerBrush.com.cy  Fact Sheet for Healthcare Providers: https://pope.com/  This test is not yet approved or  cleared by the [place] FDA and has been authorized for detection and/or diagnosis of SARS-CoV-2 by FDA under an Emergency Use Authorization (EUA).  This EUA will remain in effect (meaning this test can be used) for the duration of the COVID-19 declaration under Section 564(b)(1) of the Act, 21 U.S.C. section 360bbb-3(b)(1), unless the authorization is terminated or revoked sooner.  Performed at [facility], [address], [place], [place] [address]       Radiology Studies: CT Soft Tissue Neck W Contrast  Result Date: 04/28/2020 CLINICAL DATA:  Initial evaluation for acute cellulitis. EXAM: CT NECK WITH CONTRAST TECHNIQUE: Multidetector CT imaging of the neck was performed using the standard protocol following the bolus administration of intravenous contrast. CONTRAST:  50mL OMNIPAQUE IOHEXOL 300 MG/ML  SOLN COMPARISON:  None. FINDINGS: Pharynx and larynx: Oral cavity within normal limits without discrete mass or collection. Palatine tonsils symmetric and within normal limits. No tonsillar or peritonsillar abscess. Mild mucosal edema noted within the right oropharynx related to the acute inflammatory process within the right face.  Remainder of the oropharynx and nasopharynx otherwise within normal limits. Trace retropharyngeal effusion, likely reactive. No frank retropharyngeal abscess. Epiglottis normal. Vallecula clear. Remainder of the hypopharynx and supraglottic larynx within normal limits. True cords grossly within normal  limits allowing for patient positioning. Subglottic airway clear. Salivary glands: Asymmetric enlargement with irregularity and hyperenhancement involving the right parotid gland, consistent with acute parotitis. No obstructive stone seen within Stensen's duct. Associated swelling with inflammatory stranding throughout the adjacent right face, with involvement of the right parotid, masticator, parapharyngeal, and submandibular spaces. Additional inflammatory stranding extends inferiorly along the anterior aspect of the right neck and upper chest wall. No discrete abscess or drainable fluid collection. Left parotid gland within normal limits. Submandibular glands within normal limits. Punctate stone noted at the right floor of mouth, likely in right Wharton's duct. No associated ductal dilatation. Thyroid: Normal. Lymph nodes: Mildly prominent right level II lymph nodes measure up to 1 cm, likely reactive. No other pathologically enlarged lymph nodes identified within the neck. Vascular: Normal intravascular enhancement seen throughout the neck. Few small foci of gas lucency within the right submandibular space likely lie within the venous system, suspected be related IV access. Mild-to-moderate atheromatous change about the aortic arch and carotid siphons. Limited intracranial: Unremarkable. Visualized orbits: Unremarkable. Mastoids and visualized paranasal sinuses: Paranasal sinuses are largely clear. Small chronic appearing bilateral mastoid effusions, of doubtful significance. Middle ear cavities are well pneumatized and free of fluid. Skeleton: No acute osseous abnormality. No discrete or worrisome osseous lesions.  Moderate multilevel cervical spondylosis, most notable at C5-6 and C6-7. Median sternotomy wires noted. Upper chest: Visualized upper chest demonstrates no acute finding. Scattered atelectatic changes noted within the visualized lungs. Other: None. IMPRESSION: 1. Findings consistent with acute right parotitis with associated regional cellulitis as above. No obstructive stone, abscess, or other complication identified. 2. Punctate stone at the right floor of mouth, likely in the right Wharton's duct. No associated ductal dilatation. 3. Mildly prominent right level II lymph nodes, likely reactive. Electronically Signed   By: Rise MuBenjamin  McClintock M.D.   On: 04/28/2020 20:52       LOS: 2 days   Jodye Scali Rito EhrlichKrishnan  [facility] Pager on [email or web address]  04/30/2020, 10:57 AM

## 2020-04-30 NOTE — Evaluation (Signed)
Clinical/Bedside Swallow Evaluation Patient Details  Name: Jeffery [name] MRN: [id number] Date of Birth: 12-Dec-1955  Today's Date: 04/30/2020 Time: SLP Start Time (ACUTE ONLY): 7412 SLP Stop Time (ACUTE ONLY): 1032 SLP Time Calculation (min) (ACUTE ONLY): 56 min  Past Medical History:  Past Medical History:  Diagnosis Date  . Amaurosis fugax   . Amaurosis fugax 2012  . Aortic stenosis   . Bicuspid aortic valve   . Calculus of gallbladder   . Chest pain on exertion   . CHF (congestive heart failure) (HCC)   . Cholecystitis   . Coronary artery disease 08/01/2013   LAD stenosis  . Dysrhythmia   . Exertional shortness of breath   . Heart murmur   . Hypertension   . Hypertriglyceridemia   . Incidental pulmonary nodule, > 43mm and < 66mm 07/23/2013   5 mm L lung nodule and 4 mm R lung nodule seen incidentally on chest CT  . Osteoarthritis   . S/P aortic valve replacement with bioprosthetic valve 08/20/2013   25 mm [facility] (1-Rh) Ease bovine pericardial tissue valve  . S/P CABG x 1 08/20/2013   LIMA to LAD   Past Surgical History:  Past Surgical History:  Procedure Laterality Date  . AORTIC VALVE REPLACEMENT N/A 08/20/2013   Procedure: AORTIC VALVE REPLACEMENT (AVR);  Surgeon: Purcell Nails, MD;  Location: [facility] OR;  Service: Open Heart Surgery;  Laterality: N/A;  . CHOLECYSTECTOMY    . CORONARY ARTERY BYPASS GRAFT N/A 08/20/2013   Procedure: CORONARY ARTERY BYPASS GRAFTING (CABG);  Surgeon: Purcell Nails, MD;  Location: [facility] OR;  Service: Open Heart Surgery;  Laterality: N/A;  CABG x one, using left internal mammary artery  . INTRAOPERATIVE TRANSESOPHAGEAL ECHOCARDIOGRAM N/A 08/20/2013   Procedure: INTRAOPERATIVE TRANSESOPHAGEAL ECHOCARDIOGRAM;  Surgeon: Purcell Nails, MD;  Location: [facility] OR;  Service: Open Heart Surgery;  Laterality: N/A;  . LEFT AND RIGHT HEART CATHETERIZATION WITH CORONARY ANGIOGRAM N/A 08/01/2013   Procedure: LEFT AND RIGHT HEART CATHETERIZATION WITH CORONARY  ANGIOGRAM;  Surgeon: Donato Schultz, MD;  Location: [facility] CATH LAB;  Service: Cardiovascular;  Laterality: N/A;  . NOSE SURGERY    . umbillical hernia     HPI:  64 yo male adm to [facility] with right sided facial edema/cellulitis, diagnosed with right parotiditis.  PMH + for Parkinson's disease diagnosed in 2017, AVR, CAD s/p CABG, DDD C5-C6, C6-C7.  Swallow eval was ordered.   Assessment / Plan / Recommendation Clinical Impression  Patient presents with mild oral dysphagia due to his Parkinson's and likely exacerbated from right parotiditis.  No indication of aspiration or laryngeal penetration with intake observed *cereal bar, applesauce, 3 ounce water test.  Prolonged mastication with solids noted and wife reports pt takes approximately 1.5 hours to eat his meals.  She endorses 15 pounds of weight loss by pt and pt admits to gustatory changes.  Advised pt to consume nutrition dense foods including liquid supplements for maximizing nutrition.    Voice and cough are reportedly weak and pt presents with the typical rushed imprecise articulation noted with speech patterns of those with Parkinson's.  Recommend consider RMST after his parotiditis heals to maximize his phonatory/cough strength and swallow ability.  Wife states she would agree but pt is apprehensive stating "I'm going to get weaker any way".    Advised pt to try to [facility] on his left side, add extra gravies/sauces to foods, rinse and expectorate with water after meals to assure adequate oral clerance, brush teeth/gums/hard palate am  and pm, and avoid alcohol based cleansers.  In addition, recommend pt strengthen cough and "hock" for airway protection.  Discussed alternative ways of take po medications but  he denies issues at this time and RN reports tolerating po one at a time.    All education completed and pt is essentially on premorbid diet (Dys2) per his wife.  Recommend consider a nutrition consult to maximize po given pt 15 pound weight loss  in the last year.  Pt has been taking Prolife milk supplements x2-3 weeks prior to admission. SLP Visit Diagnosis: Dysphagia, oral phase (R13.11)    Aspiration Risk  Mild aspiration risk    Diet Recommendation Dysphagia 2 (Fine chop);Thin liquid   Liquid Administration via: Straw Medication Administration: Whole meds with liquid Supervision: Patient able to self feed Compensations: Slow rate;Small sips/bites;Other (Comment) (rinse and expectorate with water after meals)    Other  Recommendations Oral Care Recommendations: Oral care BID   Follow up Recommendations Home health SLP      Frequency and Duration     n/a       Prognosis   n/a     Swallow Study   General Date of Onset: 04/30/20 HPI: 64 yo male adm to [facility] with right sided facial edema/cellulitis, diagnosed with right parotiditis.  PMH + for Parkinson's disease diagnosed in 2017, AVR, CAD s/p CABG, DDD C5-C6, C6-C7.  Swallow eval was ordered. Previous Swallow Assessment: none Diet Prior to this Study: Dysphagia 2 (chopped);Thin liquids Temperature Spikes Noted: No Respiratory Status: Room air History of Recent Intubation: No Behavior/Cognition: Alert;Cooperative;Pleasant mood Oral Cavity Assessment: Edema;Other (comment) (erythemic soft palate) Oral Care Completed by SLP: No Oral Cavity - Dentition: Adequate natural dentition Vision: Functional for self-feeding Self-Feeding Abilities: Able to feed self Patient Positioning: Upright in bed Baseline Vocal Quality: Low vocal intensity Volitional Cough: Strong Volitional Swallow: Able to elicit    Oral/Motor/Sensory Function Overall Oral Motor/Sensory Function: Mild impairment (impaired due to Parkinson's and edema from cellulitis/parotiditis, masked facies) Facial ROM: Reduced right Facial Symmetry: Abnormal symmetry right Facial Strength: Reduced right Lingual ROM: Other (Comment) (reduced) Lingual Symmetry: Within Functional Limits Lingual Strength:  Reduced Velum: Within Functional Limits Mandible: Other (Comment) (DNT D/T cellulitis and pain risk)   Ice Chips Ice chips: Not tested   Thin Liquid Thin Liquid: Within functional limits Presentation: Straw;Self Fed    Nectar Thick Nectar Thick Liquid: Not tested   Honey Thick Honey Thick Liquid: Not tested   Puree Puree: Within functional limits Presentation: Self Fed;Spoon   Solid     Solid: Impaired Presentation: Self Fed Oral Phase Impairments: Impaired mastication Oral Phase Functional Implications: Prolonged oral transit;Impaired mastication Other Comments: prolonged mastication due to Parkinson's noted without oral pocketing, minimal adherence to upper dentition      Chales Abrahams 04/30/2020,11:14 AM  Rolena Infante, MS [facility] SLP Acute Rehab Services Office [phone]

## 2020-04-30 NOTE — TOC Initial Note (Addendum)
Transition of Care [facility]) - Initial/Assessment Note    Patient Details  Name: Jeffery [name] MRN: [id number] Date of Birth: 04-16-1956  Transition of Care [facility]) CM/SW Contact:    Jeffery [name], [place] Phone Number: 04/30/2020, 12:20 PM  Clinical Narrative:                 Patient spouse requested to talk with social worker. CSW met with the patient and his spouse at bedside. Patient eating breakfast during the visit. Spouse reports the patient is non ambulatory at home. She reports a wheelchair will be helpful at home to help with his transfers. The patient requires assistance with bathing and dressing.Spouse and family help with these task. Spouse inquired about Palliative Care Services. She reports, "his condition is not going to get better." CSW made a referral to [facility] for Palliative care services.Physician notified of the spouse request.   Spouse wants therapy at home and has requested PT/OT and a nurse aide. She inquired about having personal caregivers at home as well. CSW explain that [place] does not pay for private caregivers, this is paid via private pay, medicaid or a policy that pays for personal care services. Spouse report understanding. CSW provided education on how to search private care online. Spouse reports she will have her children look into it. Spouse inquired about Meals on Wheels, she gave csw permission to make a referral. CSW unable to make referral through [place]. CSW reached out to senior resources of [place], staff unavailable today and cannot provide an application. CSW instructed the spouse to call on Monday and provided contact information.   Wheelchair ordered through [place].  [facility] ordered for PT/OT/nurse aide.   Expected Discharge Plan: [place] Barriers to Discharge: No Barriers Identified   Patient Goals and CMS Choice   CMS Medicare.gov Compare Post Acute Care list provided to:: Patient Choice  offered to / list presented to : Patient  Expected Discharge Plan and Services Expected Discharge Plan: [place] In-house Referral: Clinical Social Work Discharge Planning Services: Mobile Meals (CSW provided patient spouse information for Meal On Wheels.) Post Acute Care Choice: Home Health Living arrangements for the past 2 months: Single Family Home                 DME Arranged: Wheelchair manual DME Agency: [employer] Date DME Agency Contacted: 04/30/20 Time DME Agency Contacted: 1220 Representative spoke with at DME Agency: [place]: PT, OT, Nurse's Aide [place] Agency: [facility] [place] Date [place]: 04/30/20 Time [place]: 39 Representative spoke with at [place]: [place] Arrangements/Services Living arrangements for the past 2 months: [place] with:: Spouse Patient language and need for interpreter reviewed:: No Do you feel safe going back to the place where you live?: Yes      Need for Family Participation in Patient Care: Yes (Comment) Care giver support system in place?: Yes (comment) Current home services: DME Criminal Activity/Legal Involvement Pertinent to Current Situation/Hospitalization: No - Comment as needed  Activities of Daily Living Home Assistive Devices/Equipment: Bedside commode/3-in-1, Wheelchair, Other (Comment), Hospital bed (electric wheelchair) ADL Screening (condition at time of admission) Patient's cognitive ability adequate to safely complete daily activities?: Yes Is the patient deaf or have difficulty hearing?: Yes (some trouble now due to infection) Does the patient have difficulty seeing, even when wearing glasses/contacts?: Yes Does the patient have difficulty concentrating, remembering, or  making decisions?: No Patient able to express need for assistance with ADLs?: Yes (has a speech impediment) Does the patient have difficulty dressing or bathing?:  Yes Independently performs ADLs?: No Communication: Independent Dressing (OT): Needs assistance Is this a change from baseline?: Pre-admission baseline Grooming: Needs assistance Is this a change from baseline?: Pre-admission baseline Feeding: Needs assistance Is this a change from baseline?: Pre-admission baseline Bathing: Needs assistance Is this a change from baseline?: Pre-admission baseline Toileting: Needs assistance Is this a change from baseline?: Pre-admission baseline In/Out Bed: Needs assistance Is this a change from baseline?: Pre-admission baseline Walks in Home: Dependent Is this a change from baseline?: Pre-admission baseline Does the patient have difficulty walking or climbing stairs?: Yes Weakness of Legs: Both Weakness of Arms/Hands: Both  Permission Sought/Granted Permission sought to share information with : Case Manager Permission granted to share information with : Yes, Verbal Permission Granted  Share Information with NAME: Jeffery [name]  Permission granted to share info w AGENCY: [place] granted to share info w Relationship: Spouse  Permission granted to share info w Contact Information: [phone]  [phone]  Emotional Assessment Appearance:: Appears stated age   Affect (typically observed): Calm Orientation: : Oriented to Self, Oriented to Place, Oriented to  Time, Oriented to Situation Alcohol / Substance Use: Not Applicable Psych Involvement: No (comment)  Admission diagnosis:  Cellulitis of face [L03.211] Cellulitis [L03.90] Parotitis [K11.20] Patient Active Problem List   Diagnosis Date Noted  . 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 Cellulitis 04/28/2020  . Left spastic hemiparesis ([place]) 11/11/2015  . S/P AVR (aortic valve replacement) 11/02/2014  . S/P aortic valve replacement with bioprosthetic valve + coronary artery bypass grafting 08/20/2013  . 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 Coronary artery disease 08/01/2013   . Incidental pulmonary nodule, > 57m and < 823m10/15/2014  . Aortic stenosis 07/16/2013  . Hypertension 07/16/2013  . Osteoarthritis 07/16/2013  . Hypertriglyceridemia 07/16/2013  . Bicuspid aortic valve 07/16/2013  . Chest pain on exertion 07/16/2013  . Exertional shortness of breath 07/16/2013  . Amaurosis fugax 06/17/2011   PCP:  MeOrpah MelterMD Pharmacy:   [id number], [address] [id number]: [phone]: [phone]  Social Determinants of Health (SDOH) Interventions    Readmission Risk Interventions No flowsheet data found.

## 2020-04-30 NOTE — TOC Progression Note (Addendum)
Transition of Care [facility]) - Progression Note    Patient Details  Name: Jeffery [name] MRN: [id number] Date of Birth: 17-Oct-1955  Transition of Care [facility]) CM/SW Contact  Clearance Coots, LCSW Phone Number: 04/30/2020, 4:09 PM  Clinical Narrative:    Patient spouse wants SNF rehab. She requested CSW look into [facility] in [place]. The SNF declined no bed availability.   She does not want pt. Info. Faxed out  to other SNF's until she does research on additional SNF's.  Weekend [facility] staff will follow up with the patient tomorrow.    Expected Discharge Plan: Home w Home Health Services Barriers to Discharge: No Barriers Identified  Expected Discharge Plan and Services Expected Discharge Plan: Home w Home Health Services In-house Referral: Clinical Social Work Discharge Planning Services: Mobile Meals (CSW provided patient spouse information for Meal On Wheels.) Post Acute Care Choice: Home Health Living arrangements for the past 2 months: Single Family Home                 DME Arranged: Wheelchair manual DME Agency: [employer] Date DME Agency Contacted: 04/30/20 Time DME Agency Contacted: 1220 Representative spoke with at DME Agency: Oletha Cruel HH Arranged: PT, OT, Nurse's Aide HH Agency: [facility] Date [facility] Agency Contacted: 04/30/20 Time HH Agency Contacted: 1113 Representative spoke with at [facility] Agency: Amy Hyatt   Social Determinants of Health (SDOH) Interventions    Readmission Risk Interventions No flowsheet data found.

## 2020-04-30 NOTE — Progress Notes (Signed)
Initial Nutrition Assessment  DOCUMENTATION CODES:   Not applicable  INTERVENTION:  Ensure Enlive po BID, each supplement provides 350 kcal and 20 grams of protein  Magic cup TID with meals, each supplement provides 290 kcal and 9 grams of protein  MVI with minerals po daily   NUTRITION DIAGNOSIS:   Increased nutrient needs related to acute illness (parotitis with surrounding cellulitis) as evidenced by estimated needs.    GOAL:   Patient will meet greater than or equal to 90% of their needs    MONITOR:   Labs, I & O's, Supplement acceptance, PO intake, Weight trends  REASON FOR ASSESSMENT:   Malnutrition Screening Tool    ASSESSMENT:  RD working remotely.   64 year old male admitted for right-sided parotitis with surrounding cellulitis without abscess with past medical history of corticobasal syndrome, CHF, AVR, CAD s/p CABG x 1, HTN, HLD. Patient presented with complaints of 1 week history of worsening pain and swelling in right face extending to neck.  Per flowsheets, po intake is improving. Patient consumed 0% of breakfast and lunch tray yesterday, 75% of dinner and 100% of breakfast this morning. Suspect po intake has been decreased due to facial pain and swelling, will continue to monitor meal intakes and will order Ensure, Magic Cup, as well as MVI to aid with meeting needs.  Mild pitting BLE; mild right facial swelling per RN assessment. No recent wt history for review, currently pt weighs 199.54 lb. Per chart, weights appear stable (201-207 lb) over the past 5 years.  Medications reviewed and include: Zofran, Klor-con, Florastor IVF: NaCl IVPB: Unasyn  Labs: K 3.3 (L) Mg 1.9 trending up   NUTRITION - FOCUSED PHYSICAL EXAM: Unable to complete at this time, RD working remotely.  Diet Order:   Diet Order            DIET DYS 2 Room service appropriate? Yes; Fluid consistency: Thin  Diet effective now                 EDUCATION NEEDS:   No  education needs have been identified at this time  Skin:  Skin Assessment: Skin Integrity Issues: Skin Integrity Issues:: Other (Comment) Other: Cellulitis; R ear  Last BM:  7/23 type 6  Height:   Ht Readings from Last 1 Encounters:  04/28/20 5\' 10"  (1.778 m)    Weight:   Wt Readings from Last 1 Encounters:  04/28/20 90.7 kg    BMI:  Body mass index is 28.7 kg/m.  Estimated Nutritional Needs:   Kcal:  2200-2400  Protein:  110-120  Fluid:  >/= 2.2 L   04/30/20, RD, LDN Clinical Nutrition After Hours/Weekend Pager # in Amion

## 2020-04-30 NOTE — Evaluation (Signed)
Physical Therapy Evaluation Patient Details Name: Jeffery [name] MRN: [id number] DOB: 06-30-1956 Today's Date: 04/30/2020   History of Present Illness  64 yo male with PMH corticobasal syndrome (Parkinsonian), AVR (bioprosthetic), CAD s/p CABG x 1 (LIMA to LAD), HTN, HLD who presented to the ER with his wife due to complaints of worsening pain and swelling in his right face down to his right neck. CT scan did not show any abscess but did show acute right parotitis with surrounding cellulitis.     Clinical Impression  Jeffery [name] is 64 y.o. male admitted with above HPI and diagnosis. Patient is currently limited by functional impairments below (see PT problem list). Patient lives with his wife and has required increased assistance with all ADL's and mobility over the last few months. He currently requires MAX assist for all mobility. Pt was able to initiate sit<>stand with improved use of UE's/LE's using Corene Cornea vs HHA or use of RW. Recommend use of Stedy by staff at this time. Patient will benefit from continued skilled PT interventions to address impairments and progress independence with mobility, recommending SNF with 24/7 assist. Acute PT will follow and progress as able.     Follow Up Recommendations SNF;Supervision/Assistance - 24 hour    Equipment Recommendations   (defer to facility, (maybe Stedy lift, transport chair))    Recommendations for Other Services       Precautions / Restrictions Precautions Precautions: Fall Restrictions Weight Bearing Restrictions: No      Mobility  Bed Mobility Overal bed mobility: Needs Assistance Bed Mobility: Sit to Supine       Sit to supine: +2 for physical assistance;+2 for safety/equipment;Max assist   General bed mobility comments: Pt required max assist to control lowering trunk to Lt side and raise LE's into bed. +2 assist required to reposition in bed.   Transfers Overall transfer level: Needs assistance Equipment used:  Rolling walker (2 wheeled);2 person hand held assist Transfers: Sit to/from [employer] Sit to Stand: Max assist;+2 physical assistance;+2 safety/equipment Stand pivot transfers: Max assist;+2 physical assistance;+2 safety/equipment;Total assist       General transfer comment: Pt required +2 assist for Sit<>Stand from recliner with 2 HHA and RW. 2HHA provided for stand pivot to [facility] and pt very limited by poor LE coordination to step. MAX assist to prevent fall. Pt required mod assist +2 to rise with Stedy from [facility] to return to sit EOB.  Ambulation/Gait      General Gait Details: unsafe at this time  Stairs         Wheelchair Mobility    Modified Rankin (Stroke Patients Only)       Balance Overall balance assessment: Needs assistance Sitting-balance support: Feet supported;Bilateral upper extremity supported Sitting balance-Leahy Scale: Fair     Standing balance support: Bilateral upper extremity supported Standing balance-Leahy Scale: Zero              Pertinent Vitals/Pain Pain Assessment: No/denies pain    Home Living Family/patient expects to be discharged to:: Private residence Living Arrangements: Spouse/significant other Available Help at Discharge: Family Type of Home: House Home Access: Ramped entrance     Home Layout: One level Home Equipment: Hospital bed;Walker - 2 wheels;Bedside commode;Wheelchair - power;Shower seat - built in Additional Comments: pt's wife is helping him with all mobility and ADL's    Prior Function Level of Independence: Needs assistance   Gait / Transfers Assistance Needed: ~6 months ago pt was ambulating with RW, over last  6 weeks he has declined more and required 2 person HHA to take several small steps or transfer. pt's wife is using rollator to roll him into bathroom. He has been non-ambulatory for a little more than 2 weeks.  ADL's / Homemaking Assistance Needed: Pt requires assist with all bathing,  dressing, and self care from wife.         Hand Dominance        Extremity/Trunk Assessment   Upper Extremity Assessment Upper Extremity Assessment: Defer to OT evaluation    Lower Extremity Assessment Lower Extremity Assessment: Generalized weakness;RLE deficits/detail;LLE deficits/detail RLE Deficits / Details: pt with weakness and unable to fully extend at knees and hips in standing on bil LEs. LLE Deficits / Details: pt with weakness and unable to fully extend at knees and hips in standing on bil LEs.    Cervical / Trunk Assessment Cervical / Trunk Assessment: Kyphotic  Communication   Communication: HOH;Expressive difficulties (low volume for voice)  Cognition Arousal/Alertness: Awake/alert Behavior During Therapy: WFL for tasks assessed/performed Overall Cognitive Status: Within Functional Limits for tasks assessed         General Comments      Exercises     Assessment/Plan    PT Assessment Patient needs continued PT services  PT Problem List Decreased strength;Decreased range of motion;Decreased activity tolerance;Decreased balance;Decreased mobility;Decreased coordination;Decreased knowledge of use of DME;Decreased safety awareness       PT Treatment Interventions DME instruction;Gait training;Functional mobility training;Therapeutic activities;Therapeutic exercise;Balance training;Cognitive remediation;Neuromuscular re-education;Patient/family education;Manual techniques    PT Goals (Current goals can be found in the Care Plan section)  Acute Rehab PT Goals Patient Stated Goal: be able to assist with standing more PT Goal Formulation: With patient/family Time For Goal Achievement: 05/07/20 Potential to Achieve Goals: Fair    Frequency Min 2X/week   Barriers to discharge Decreased caregiver support pts wife cannot provide needed physical assist and will need additional support for pt to return home.       AM-PAC PT "6 Clicks" Mobility  Outcome  Measure Help needed turning from your back to your side while in a flat bed without using bedrails?: Total Help needed moving from lying on your back to sitting on the side of a flat bed without using bedrails?: Total Help needed moving to and from a bed to a chair (including a wheelchair)?: Total Help needed standing up from a chair using your arms (e.g., wheelchair or bedside chair)?: Total Help needed to walk in hospital room?: Total Help needed climbing 3-5 steps with a railing? : Total 6 Click Score: 6    End of Session Equipment Utilized During Treatment: Gait belt Activity Tolerance: Patient tolerated treatment well Patient left: in bed;with call bell/phone within reach;with bed alarm set;with family/visitor present Nurse Communication: Mobility status;Need for lift equipment PT Visit Diagnosis: Muscle weakness (generalized) (M62.81);Difficulty in walking, not elsewhere classified (R26.2);Other symptoms and signs involving the nervous system (R29.898);Other abnormalities of gait and mobility (R26.89);Unsteadiness on feet (R26.81)    Time: 3545-6256 PT Time Calculation (min) (ACUTE ONLY): 44 min   Charges:   PT Evaluation $PT Eval Moderate Complexity: 1 Mod PT Treatments $Therapeutic Activity: 23-37 mins        Wynn Maudlin, DPT Acute Rehabilitation Services  Office [phone] Pager [phone]  04/30/2020 2:51 PM

## 2020-04-30 NOTE — Progress Notes (Signed)
[facility])  Referral received for outpatient palliative care once pt discharges home.  ACC will follow and begin services once he is discharged.  Thank you, Wallis Bamberg RN, BSN, CCRN [facility] Liaison

## 2020-05-01 LAB — BASIC METABOLIC PANEL
Anion gap: 7 (ref 5–15)
BUN: 11 mg/dL (ref 8–23)
CO2: 27 mmol/L (ref 22–32)
Calcium: 8.6 mg/dL — ABNORMAL LOW (ref 8.9–10.3)
Chloride: 104 mmol/L (ref 98–111)
Creatinine, Ser: 0.83 mg/dL (ref 0.61–1.24)
GFR calc Af Amer: >60 mL/min (ref >60)
GFR calc non Af Amer: >60 mL/min (ref >60)
Glucose, Bld: 99 mg/dL (ref 70–99)
Potassium: 3.8 mmol/L (ref 3.5–5.1)
Sodium: 138 mmol/L (ref 135–145)

## 2020-05-01 LAB — CBC
HCT: 38.1 % — ABNORMAL LOW (ref 39.0–52.0)
Hemoglobin: 12.6 g/dL — ABNORMAL LOW (ref 13.0–17.0)
MCH: 30.4 pg (ref 26.0–34.0)
MCHC: 33.1 g/dL (ref 30.0–36.0)
MCV: 91.8 fL (ref 80.0–100.0)
Platelets: 194 10*3/uL (ref 150–400)
RBC: 4.15 MIL/uL — ABNORMAL LOW (ref 4.22–5.81)
RDW: 12.1 % (ref 11.5–15.5)
WBC: 5.1 10*3/uL (ref 4.0–10.5)
nRBC: 0 % (ref 0.0–0.2)

## 2020-05-01 MED ORDER — TRAZODONE HCL 50 MG PO TABS
50.0000 mg | ORAL_TABLET | Freq: Once | ORAL | Status: AC
  Administered 2020-05-01: 50 mg via ORAL
  Filled 2020-05-01: qty 1

## 2020-05-01 MED ORDER — MELOXICAM 7.5 MG PO TABS
7.5000 mg | ORAL_TABLET | Freq: Two times a day (BID) | ORAL | Status: DC
  Administered 2020-05-01 – 2020-05-05 (×9): 7.5 mg via ORAL
  Filled 2020-05-01 (×10): qty 1

## 2020-05-01 MED ORDER — ONDANSETRON 4 MG PO TBDP
8.0000 mg | ORAL_TABLET | Freq: Three times a day (TID) | ORAL | Status: AC
  Administered 2020-05-01 – 2020-05-02 (×6): 8 mg via ORAL
  Filled 2020-05-01 (×6): qty 2

## 2020-05-01 MED ORDER — AMOXICILLIN-POT CLAVULANATE 875-125 MG PO TABS
1.0000 | ORAL_TABLET | Freq: Two times a day (BID) | ORAL | Status: DC
  Administered 2020-05-01 – 2020-05-05 (×9): 1 via ORAL
  Filled 2020-05-01 (×9): qty 1

## 2020-05-01 MED ORDER — LOPERAMIDE HCL 2 MG PO CAPS: 4.0000 mg | ORAL_CAPSULE | Freq: Three times a day (TID) | ORAL | Status: DC | PRN

## 2020-05-01 NOTE — Plan of Care (Signed)
  Problem: Health Behavior/Discharge Planning: Goal: Ability to manage health-related needs will improve Outcome: Progressing   Problem: Clinical Measurements: Goal: Ability to maintain clinical measurements within normal limits will improve Outcome: Progressing Goal: Will remain free from infection Outcome: Progressing Goal: Diagnostic test results will improve Outcome: Progressing   Problem: Activity: Goal: Risk for activity intolerance will decrease Outcome: Progressing   Problem: Pain Managment: Goal: General experience of comfort will improve Outcome: Progressing   

## 2020-05-01 NOTE — Progress Notes (Signed)
[facility] PROGRESS NOTE   FERGUS THRONE VOJ:[id number] DOB: Nov 18, 1955 DOA: 04/28/2020  PCP: Joycelyn Rua, MD  Brief History/Interval Summary: 64 yo male with PMH corticobasal syndrome (Parkinsonian), AVR (bioprosthetic), CAD s/p CABG x 1 (LIMA to LAD), HTN, HLD who presented to the ER with his wife due to complaints of worsening pain and swelling in his right face down to his right neck.  Symptoms have been ongoing for about 5 days.  Apparently seen by an outpatient provider who started the patient on Augmentin but due to worsening symptoms and fever at home he presented to the emergency department.  CT scan did not show any abscess but did show acute right parotitis.  Patient was hospitalized for further management.  Reason for Visit: Acute right parotitis with the surrounding cellulitis  Consultants: Phone discussion with ENT, Dr. Jearld Fenton  Procedures: None  Antibiotics: Anti-infectives (From admission, onward)   Start     Dose/Rate Route Frequency Ordered Stop   05/01/20 1200  amoxicillin-clavulanate (AUGMENTIN) 875-125 MG per tablet 1 tablet     Discontinue     1 tablet Oral Every 12 hours 05/01/20 1149     04/29/20 0200  Ampicillin-Sulbactam (UNASYN) 3 g in sodium chloride 0.9 % 100 mL IVPB  Status:  Discontinued        3 g 200 mL/hr over 30 Minutes Intravenous Every 6 hours 04/28/20 2355 05/01/20 1149   04/28/20 1900  Ampicillin-Sulbactam (UNASYN) 3 g in sodium chloride 0.9 % 100 mL IVPB        3 g 200 mL/hr over 30 Minutes Intravenous STAT 04/28/20 1857 04/28/20 2046      Subjective/Interval History: Patient mentions that he continues to have loose stools although it is not watery.  Denies any abdominal pain.  The facial swelling has been improving.  His wife is at the bedside.  She is asking about short-term rehab.     Assessment/Plan:  Acute right parotitis with surrounding cellulitis Patient was started on Unasyn.  He was started on Augmentin but took it  just for 1 day before he presented to the hospital.  Will not consider that a failure of treatment.   Patient underwent CT scan which did not show any abscess.  The findings were discussed with Dr. Jearld Fenton with ENT.  Apart from antibiotics, heating pad and NSAIDs were also recommended.  He was started on Motrin.  Patient's parotitis and surrounding inflammation has improved.  His swelling has decreased significantly.  He feels better.  Change him over to Augmentin today.  Loose stools most likely due to antibiotics.  Abdomen is benign.  Probiotics.  Imodium as needed.    History of Parkinson's/Corticobasal syndrome Followed by neurology.  Last seen in March.  Noted to be on Sinemet which is being continued.  Sometimes he apparently takes Zofran before his Sinemet.  Speech therapy consulted for swallow evaluation.  On dysphagia 2 diet currently.  Hypokalemia Potassium normal this morning.  Magnesium is 1.8.  Essential hypertension Blood pressure noted to be borderline low in the emergency department.  His antihypertensives are on hold.  Blood pressure stable.  Normocytic anemia Mild.  No evidence of overt bleeding.  Outpatient evaluation.   DVT Prophylaxis: Subcutaneous heparin Code Status: Full code Family Communication: Discussed with the patient and his wife. Disposition Plan: PT and OT has seen the patient.  Skilled nursing facility has been recommended.  Family in  discussions with Child psychotherapist.    Status is: Inpatient  Remains  inpatient appropriate because:IV treatments appropriate due to intensity of illness or inability to take PO   Dispo:  Patient From: Home  Planned Disposition: Home  Expected discharge date: 05/01/20  Medically stable for discharge: No        Medications:  Scheduled: . amoxicillin-clavulanate  1 tablet Oral Q12H  . carbidopa-levodopa  2 tablet Oral TID PC  . DULoxetine  30 mg Oral QPM  . DULoxetine  60 mg Oral Daily  . feeding supplement (ENSURE  ENLIVE)  237 mL Oral BID BM  . heparin  5,000 Units Subcutaneous Q8H  . lidocaine  1 patch Transdermal Q24H  . melatonin  5 mg Oral QHS  . meloxicam  7.5 mg Oral BID  . multivitamin with minerals  1 tablet Oral Daily  . ondansetron  8 mg Oral TID  . pantoprazole  40 mg Oral Q1200  . saccharomyces boulardii  250 mg Oral BID  . sodium chloride flush  3 mL Intravenous Q12H   Continuous: . sodium chloride 30 mL/hr at 04/30/20 1500   NIO:EVOJJKKXF, morphine injection, oxyCODONE   Objective:  Vital Signs  Vitals:   04/30/20 0546 04/30/20 1342 04/30/20 2202 05/01/20 0514  BP: (!) 132/96 (!) 125/88 (!) 158/78 (!) 139/89  Pulse: 78 79 86 74  Resp: 16 17 16 18   Temp: 98.3 F (36.8 C) (!) 97.5 F (36.4 C) 98 F (36.7 C) 98 F (36.7 C)  TempSrc: Oral Oral    SpO2: 97% 98% 96% 96%  Weight:      Height:        Intake/Output Summary (Last 24 hours) at 05/01/2020 1223 Last data filed at 05/01/2020 0959 Gross per 24 hour  Intake 1564.79 ml  Output 2000 ml  Net -435.21 ml   Filed Weights   04/28/20 1841  Weight: 90.7 kg    General appearance: Awake alert.  In no distress Right side of the face has improved significantly.  Swelling is gone down.  No erythema noted today.  Nontender. Resp: Clear to auscultation bilaterally.  Normal effort Cardio: S1-S2 is normal regular.  No S3-S4.  No rubs murmurs or bruit GI: Abdomen is soft.  Nontender nondistended.  Bowel sounds are present normal.  No masses organomegaly Extremities: No edema.   Neurologic: Awake alert.  Rigidity due to Parkinson's.  No other deficits.    Lab Results:  Data Reviewed: I have personally reviewed following labs and imaging studies  CBC: Recent Labs  Lab 04/28/20 1832 04/29/20 0308 04/30/20 0258 05/01/20 0332  WBC 11.4* 8.3 5.1 5.1  NEUTROABS 9.0* 5.8  --   --   HGB 14.6 12.4* 12.8* 12.6*  HCT 43.6 37.3* 37.1* 38.1*  MCV 91.8 92.8 90.7 91.8  PLT 161 143* 155 194    Basic Metabolic  Panel: Recent Labs  Lab 04/28/20 1832 04/29/20 0308 04/29/20 1356 04/30/20 0258 05/01/20 0332  NA 138 137  --  138 138  K 3.6 2.8* 3.8 3.3* 3.8  CL 96* 103  --  103 104  CO2 30 26  --  24 27  GLUCOSE 115* 97  --  84 99  BUN 23 18  --  15 11  CREATININE 1.13 0.89  --  0.77 0.83  CALCIUM 9.3 8.4*  --  8.6* 8.6*  MG  --  1.8  --  1.9  --     GFR: Estimated Creatinine Clearance: 104.5 mL/min (by C-G formula based on SCr of 0.83 mg/dL).  Liver Function Tests: Recent Labs  Lab 04/28/20 1832  AST 23  ALT 15  ALKPHOS 50  BILITOT 1.1  PROT 7.2  ALBUMIN 3.8     Recent Results (from the past 240 hour(s))  Blood culture (routine x 2)     Status: None (Preliminary result)   Collection Time: 04/28/20  6:26 PM   Specimen: BLOOD LEFT HAND  Result Value Ref Range Status   Specimen Description   Final    BLOOD LEFT HAND Performed at [facility], [address] [place], [place] [address]    Special Requests   Final    BOTTLES DRAWN AEROBIC ONLY Blood Culture adequate volume Performed at [facility], [address], [place], [place] [address]    Culture   Final    NO GROWTH 3 DAYS Performed at [facility], [address] [place], [place] [address]    Report Status PENDING  Incomplete  Blood culture (routine x 2)     Status: None (Preliminary result)   Collection Time: 04/28/20  6:32 PM   Specimen: BLOOD  Result Value Ref Range Status   Specimen Description   Final    BLOOD RIGHT ANTECUBITAL Performed at [facility], [address], [place], [place] [address]    Special Requests   Final    BOTTLES DRAWN AEROBIC AND ANAEROBIC Blood Culture adequate volume Performed at [facility], [address], [place], [place] [address]    Culture   Final    NO GROWTH 3 DAYS Performed at [facility], [address] [place], [place] [address]    Report Status PENDING  Incomplete  SARS  Coronavirus 2 by RT PCR (hospital order, performed in [facility] hospital lab) Nasopharyngeal Nasopharyngeal Swab     Status: None   Collection Time: 04/28/20  9:41 PM   Specimen: Nasopharyngeal Swab  Result Value Ref Range Status   SARS Coronavirus 2 NEGATIVE NEGATIVE Final    Comment: (NOTE) SARS-CoV-2 target nucleic acids are NOT DETECTED.  The SARS-CoV-2 RNA is generally detectable in upper and lower respiratory specimens during the acute phase of infection. The lowest concentration of SARS-CoV-2 viral copies this assay can detect is 250 copies / mL. A negative result does not preclude SARS-CoV-2 infection and should not be used as the sole basis for treatment or other patient management decisions.  A negative result may occur with improper specimen collection / handling, submission of specimen other than nasopharyngeal swab, presence of viral mutation(s) within the areas targeted by this assay, and inadequate number of viral copies (<250 copies / mL). A negative result must be combined with clinical observations, patient history, and epidemiological information.  Fact Sheet for Patients:   BoilerBrush.com.cy  Fact Sheet for Healthcare Providers: https://pope.com/  This test is not yet approved or  cleared by the [place] FDA and has been authorized for detection and/or diagnosis of SARS-CoV-2 by FDA under an Emergency Use Authorization (EUA).  This EUA will remain in effect (meaning this test can be used) for the duration of the COVID-19 declaration under Section 564(b)(1) of the Act, 21 U.S.C. section 360bbb-3(b)(1), unless the authorization is terminated or revoked sooner.  Performed at [facility], [address] [place], [place] [address]       Radiology Studies: No results found.     LOS: 3 days   Aidenjames Heckmann [employer] on [email or web address]  05/01/2020, 12:23 PM

## 2020-05-01 NOTE — Progress Notes (Signed)
Occupational Therapy Treatment Patient Details Name: Jeffery [name] MRN: [id number] DOB: 1955/12/12 Today's Date: 05/01/2020    History of present illness 64 yo male with PMH corticobasal syndrome (Parkinsonian), AVR (bioprosthetic), CAD s/p CABG x 1 (LIMA to LAD), HTN, HLD who presented to the ER with his wife due to complaints of worsening pain and swelling in his right face down to his right neck. CT scan did not show any abscess but did show acute right parotitis with surrounding cellulitis.    OT comments  Wife is interested in SNF  Follow Up Recommendations  Home health OT;Supervision/Assistance - 24 hour;SNF    Equipment Recommendations  None recommended by OT    Recommendations for Other Services      Precautions / Restrictions Precautions Precautions: Fall       Mobility Bed Mobility Overal bed mobility: Needs Assistance Bed Mobility: Supine to Sit     Supine to sit: Max assist        Transfers Overall transfer level: Needs assistance Equipment used: 2 person hand held assist Transfers: Sit to/from [employer] Transfers Sit to Stand: Max assist;+2 physical assistance;+2 safety/equipment Stand pivot transfers: Max assist;+2 physical assistance;+2 safety/equipment;Total assist            Balance Overall balance assessment: Needs assistance Sitting-balance support: Feet supported;Bilateral upper extremity supported Sitting balance-Leahy Scale: Fair     Standing balance support: Bilateral upper extremity supported Standing balance-Leahy Scale: Zero                             ADL either performed or assessed with clinical judgement   ADL Overall ADL's : Needs assistance/impaired                         Toilet Transfer: Maximal assistance;+2 for physical assistance;+2 for safety/equipment;Stand-pivot;BSC   Toileting- Clothing Manipulation and Hygiene: +2 for physical assistance;+2 for safety/equipment;Maximal assistance          General ADL Comments: Discussion with wife regarding DC plan -Wife is not interested in ST SNF.  OT does agree.  Pt did try primo male catheter yesterday and wife stated it didnt work well.     Vision Patient Visual Report: No change from baseline            Cognition Arousal/Alertness: Awake/alert Behavior During Therapy: WFL for tasks assessed/performed Overall Cognitive Status: Within Functional Limits for tasks assessed                                                     Pertinent Vitals/ Pain       Faces Pain Scale: Hurts even more Pain Location: head Pain Descriptors / Indicators: Dull Pain Intervention(s): Limited activity within patient's tolerance;Monitored during session;Premedicated before session;Repositioned     Prior Functioning/Environment              Frequency  Min 2X/week        Progress Toward Goals  OT Goals(current goals can now be found in the care plan section)  Progress towards OT goals: Progressing toward goals     Plan Discharge plan needs to be updated       AM-PAC OT "6 Clicks" Daily Activity     Outcome Measure   Help from another person  eating meals?: A Little Help from another person taking care of personal grooming?: A Little Help from another person toileting, which includes using toliet, bedpan, or urinal?: Total Help from another person bathing (including washing, rinsing, drying)?: A Lot Help from another person to put on and taking off regular upper body clothing?: A Lot Help from another person to put on and taking off regular lower body clothing?: Total 6 Click Score: 12    End of Session Equipment Utilized During Treatment: Gait belt  OT Visit Diagnosis: Unsteadiness on feet (R26.81);Other abnormalities of gait and mobility (R26.89);Muscle weakness (generalized) (M62.81);History of falling (Z91.81)   Activity Tolerance Patient tolerated treatment well   Patient Left with call  bell/phone within reach;with family/visitor present;in bed;with bed alarm set   Nurse Communication Mobility status        Time: 1031-2811 OT Time Calculation (min): 20 min  Charges: OT General Charges $OT Visit: 1 Visit OT Treatments $Self Care/Home Management : 8-22 mins  Lise Auer, OT Acute Rehabilitation Services [phone] Office- [phone]      Giuliano Preece, Karin Golden D 05/01/2020, 11:25 AM

## 2020-05-02 LAB — CBC
HCT: 41.2 % (ref 39.0–52.0)
Hemoglobin: 13.6 g/dL (ref 13.0–17.0)
MCH: 30.4 pg (ref 26.0–34.0)
MCHC: 33 g/dL (ref 30.0–36.0)
MCV: 92 fL (ref 80.0–100.0)
Platelets: 226 10*3/uL (ref 150–400)
RBC: 4.48 MIL/uL (ref 4.22–5.81)
RDW: 12.1 % (ref 11.5–15.5)
WBC: 5.8 10*3/uL (ref 4.0–10.5)
nRBC: 0 % (ref 0.0–0.2)

## 2020-05-02 LAB — BASIC METABOLIC PANEL
Anion gap: 9 (ref 5–15)
BUN: 8 mg/dL (ref 8–23)
CO2: 28 mmol/L (ref 22–32)
Calcium: 9.1 mg/dL (ref 8.9–10.3)
Chloride: 103 mmol/L (ref 98–111)
Creatinine, Ser: 0.93 mg/dL (ref 0.61–1.24)
GFR calc Af Amer: >60 mL/min (ref >60)
GFR calc non Af Amer: >60 mL/min (ref >60)
Glucose, Bld: 90 mg/dL (ref 70–99)
Potassium: 3.9 mmol/L (ref 3.5–5.1)
Sodium: 140 mmol/L (ref 135–145)

## 2020-05-02 NOTE — TOC Progression Note (Signed)
Transition of Care [facility]) - Progression Note    Patient Details  Name: Jeffery [name] MRN: [id number] Date of Birth: January 19, 1956  [facility]) CM/SW Contact  Jeffery [name] Phone Number: [phone] 05/02/2020, 1:57 PM  Clinical Narrative:     CSW spoke with patient and family about SNF placement.  Family is aware patient was decline for bed offer at [facility] in [place].  Family requested CSW try to send [facility] and referral information to [facility] again, CSW stated I would.  CSw explained to family SNF placement procedure, including the inability for CSW to decide who will take the patient or the reasons why a patient is denied a bed offer.  Patient's family verbalized understanding.  Pateient's family would requested CSW to send Fl2 and referral to [facility] in [place] and [facility] at [place].  CSW stated that I would send the information.  Patient's wife Jeffery [name], would like for patient to be placed "somewhere I can see him anytime I want" and will not consider a SNF placement with restrictive visitation policies. Jeffery [name] stated, "If I can't find one that let's me see him, I will bring him home against the doctor's advice." CSW stated she understood her concerns, and once again reiterated the SNF placement process to Derrill Memo stated understanding.  CSW gave Jeffery [name], Medicare.gov info, so she could contact SNFs and ask information about visitation policies.  CSW has sent Fl2 and referral to several SNFs in the [place], [place], [place], [place] and [place] areas.  Expected Discharge Plan: Home w Home Health Services Barriers to Discharge: No Barriers Identified  Expected Discharge Plan and Services Expected Discharge Plan: Home w Home Health Services In-house Referral: Clinical Social Work Discharge Planning Services: Mobile Meals (CSW provided patient spouse information for [employer] Whe[employer].) Post Acute Care Choice: Home Health Living  arrangements for the past 2 months: Single Family Home                 DME Arranged: Wheelchair manual DME Agency: [employer] Date DME Agency Contacted: 04/30/20 Time DME Agency Contacted: 1220 Representative spoke with at DME Agency: Oletha Cruel HH Arranged: PT, OT, Nurse's Aide HH Agency: [facility] Date [facility] Agency Contacted: 04/30/20 Time HH Agency Contacted: 1113 Representative spoke with at [facility] Agency: Amy Hyatt   Social Determinants of Health (SDOH) Interventions    Readmission Risk Interventions No flowsheet data found.

## 2020-05-02 NOTE — Progress Notes (Signed)
[facility] PROGRESS NOTE   JASH WAHLEN OEV:[id number] DOB: 1956-09-15 DOA: 04/28/2020  PCP: Joycelyn Rua, MD  Brief History/Interval Summary: 64 yo male with PMH corticobasal syndrome (Parkinsonian), AVR (bioprosthetic), CAD s/p CABG x 1 (LIMA to LAD), HTN, HLD who presented to the ER with his wife due to complaints of worsening pain and swelling in his right face down to his right neck.  Symptoms have been ongoing for about 5 days.  Apparently seen by an outpatient provider who started the patient on Augmentin but due to worsening symptoms and fever at home he presented to the emergency department.  CT scan did not show any abscess but did show acute right parotitis.  Patient was hospitalized for further management.  Reason for Visit: Acute right parotitis with the surrounding cellulitis  Consultants: Phone discussion with ENT, Dr. Jearld Fenton  Procedures: None  Antibiotics: Anti-infectives (From admission, onward)   Start     Dose/Rate Route Frequency Ordered Stop   05/01/20 1200  amoxicillin-clavulanate (AUGMENTIN) 875-125 MG per tablet 1 tablet     Discontinue     1 tablet Oral Every 12 hours 05/01/20 1149     04/29/20 0200  Ampicillin-Sulbactam (UNASYN) 3 g in sodium chloride 0.9 % 100 mL IVPB  Status:  Discontinued        3 g 200 mL/hr over 30 Minutes Intravenous Every 6 hours 04/28/20 2355 05/01/20 1149   04/28/20 1900  Ampicillin-Sulbactam (UNASYN) 3 g in sodium chloride 0.9 % 100 mL IVPB        3 g 200 mL/hr over 30 Minutes Intravenous STAT 04/28/20 1857 04/28/20 2046      Subjective/Interval History: Patient feels well.  Facial swelling has improved.  Pain is better than before.  Able to tolerate diet.  No new complaints offered.     Assessment/Plan:  Acute right parotitis with surrounding cellulitis Patient was started on Unasyn.  He was started on Augmentin but took it just for 1 day before he presented to the hospital.  Will not consider that a failure of  treatment.   Patient underwent CT scan which did not show any abscess.  The findings were discussed with Dr. Jearld Fenton with ENT.  Apart from antibiotics, heating pad and NSAIDs were also recommended.  Patient was given short course of Motrin.  Patient's parotitis and surrounding inflammation has improved.  His swelling has decreased significantly.   Patient feels much better.  Changed over to Augmentin yesterday.  Probiotics.  Imodium as needed for loose stools.  Abdomen is benign.  Loose stools have resolved.  History of Parkinson's/Corticobasal syndrome Followed by neurology.  Last seen in March.  Noted to be on Sinemet which is being continued.  Sometimes he apparently takes Zofran before his Sinemet.  Speech therapy consulted for swallow evaluation.  On dysphagia 2 diet currently.  Hypokalemia Normal this morning.    Essential hypertension Blood pressure noted to be borderline low in the emergency department.  His antihypertensives are on hold.  Blood pressures have improved.  On benazepril and HCTZ at home.  Should be okay to resume at discharge.  Normocytic anemia Mild.  No evidence of overt bleeding.  Outpatient evaluation.   DVT Prophylaxis: Subcutaneous heparin Code Status: Full code Family Communication: Discussed with the patient and his wife. Disposition Plan: PT and OT has seen the patient.  Skilled nursing facility has been recommended.  Family in  discussions with Child psychotherapist.    Status is: Inpatient  Remains inpatient appropriate  because:IV treatments appropriate due to intensity of illness or inability to take PO   Dispo:  Patient From: Home  Planned Disposition: Skilled Nursing Facility  Expected discharge date: 05/03/20  Medically stable for discharge: Yes      Medications:  Scheduled: . amoxicillin-clavulanate  1 tablet Oral Q12H  . carbidopa-levodopa  2 tablet Oral TID PC  . DULoxetine  30 mg Oral QPM  . DULoxetine  60 mg Oral Daily  . feeding supplement  (ENSURE ENLIVE)  237 mL Oral BID BM  . heparin  5,000 Units Subcutaneous Q8H  . lidocaine  1 patch Transdermal Q24H  . melatonin  5 mg Oral QHS  . meloxicam  7.5 mg Oral BID  . multivitamin with minerals  1 tablet Oral Daily  . ondansetron  8 mg Oral TID  . pantoprazole  40 mg Oral Q1200  . saccharomyces boulardii  250 mg Oral BID  . sodium chloride flush  3 mL Intravenous Q12H   Continuous:  ONG:EXBMWUXLKPRN:bisacodyl, loperamide, morphine injection, oxyCODONE   Objective:  Vital Signs  Vitals:   05/01/20 0514 05/01/20 1321 05/01/20 2051 05/02/20 0609  BP: (!) 139/89 (!) 138/93 (!) 119/94 (!) 126/95  Pulse: 74 84 81 76  Resp: 18 18 18 16   Temp: 98 F (36.7 C) 97.7 F (36.5 C) 98.6 F (37 C) 98.7 F (37.1 C)  TempSrc:  Oral Oral Oral  SpO2: 96% 96% 95% 95%  Weight:      Height:        Intake/Output Summary (Last 24 hours) at 05/02/2020 1119 Last data filed at 05/02/2020 0850 Gross per 24 hour  Intake 815.22 ml  Output 2125 ml  Net -1309.78 ml   Filed Weights   04/28/20 1841  Weight: 90.7 kg    General appearance: Awake alert.  In no distress Right parotid gland swelling has significantly improved.  No erythema.  Nontender. Resp: Clear to auscultation bilaterally.  Normal effort Cardio: S1-S2 is normal regular.  No S3-S4.  No rubs murmurs or bruit GI: Abdomen is soft.  Nontender nondistended.  Bowel sounds are present normal.  No masses organomegaly Extremities: No edema.  Neurologic: No focal neurological deficits.     Lab Results:  Data Reviewed: I have personally reviewed following labs and imaging studies  CBC: Recent Labs  Lab 04/28/20 1832 04/29/20 0308 04/30/20 0258 05/01/20 0332 05/02/20 0338  WBC 11.4* 8.3 5.1 5.1 5.8  NEUTROABS 9.0* 5.8  --   --   --   HGB 14.6 12.4* 12.8* 12.6* 13.6  HCT 43.6 37.3* 37.1* 38.1* 41.2  MCV 91.8 92.8 90.7 91.8 92.0  PLT 161 143* 155 194 226    Basic Metabolic Panel: Recent Labs  Lab 04/28/20 1832 04/28/20 1832  04/29/20 0308 04/29/20 1356 04/30/20 0258 05/01/20 0332 05/02/20 0338  NA 138  --  137  --  138 138 140  K 3.6   < > 2.8* 3.8 3.3* 3.8 3.9  CL 96*  --  103  --  103 104 103  CO2 30  --  26  --  24 27 28   GLUCOSE 115*  --  97  --  84 99 90  BUN 23  --  18  --  15 11 8   CREATININE 1.13  --  0.89  --  0.77 0.83 0.93  CALCIUM 9.3  --  8.4*  --  8.6* 8.6* 9.1  MG  --   --  1.8  --  1.9  --   --    < > =  values in this interval not displayed.    GFR: Estimated Creatinine Clearance: 93.3 mL/min (by C-G formula based on SCr of 0.93 mg/dL).  Liver Function Tests: Recent Labs  Lab 04/28/20 1832  AST 23  ALT 15  ALKPHOS 50  BILITOT 1.1  PROT 7.2  ALBUMIN 3.8     Recent Results (from the past 240 hour(s))  Blood culture (routine x 2)     Status: None (Preliminary result)   Collection Time: 04/28/20  6:26 PM   Specimen: BLOOD LEFT HAND  Result Value Ref Range Status   Specimen Description   Final    BLOOD LEFT HAND Performed at [facility], [address] [place], [place] [address]    Special Requests   Final    BOTTLES DRAWN AEROBIC ONLY Blood Culture adequate volume Performed at [facility], [address], [place], [place] [address]    Culture   Final    NO GROWTH 4 DAYS Performed at [facility], [address], [place], [place] [address]    Report Status PENDING  Incomplete  Blood culture (routine x 2)     Status: None (Preliminary result)   Collection Time: 04/28/20  6:32 PM   Specimen: BLOOD  Result Value Ref Range Status   Specimen Description   Final    BLOOD RIGHT ANTECUBITAL Performed at [facility], [address] [place], [place] [address]    Special Requests   Final    BOTTLES DRAWN AEROBIC AND ANAEROBIC Blood Culture adequate volume Performed at [facility], [address] [place], [place] [address]    Culture   Final    NO GROWTH 4 DAYS Performed at [facility], [address] [place], [place] [address]    Report Status PENDING  Incomplete  SARS Coronavirus 2 by RT PCR (hospital order, performed in [facility] hospital lab) Nasopharyngeal Nasopharyngeal Swab     Status: None   Collection Time: 04/28/20  9:41 PM   Specimen: Nasopharyngeal Swab  Result Value Ref Range Status   SARS Coronavirus 2 NEGATIVE NEGATIVE Final    Comment: (NOTE) SARS-CoV-2 target nucleic acids are NOT DETECTED.  The SARS-CoV-2 RNA is generally detectable in upper and lower respiratory specimens during the acute phase of infection. The lowest concentration of SARS-CoV-2 viral copies this assay can detect is 250 copies / mL. A negative result does not preclude SARS-CoV-2 infection and should not be used as the sole basis for treatment or other patient management decisions.  A negative result may occur with improper specimen collection / handling, submission of specimen other than nasopharyngeal swab, presence of viral mutation(s) within the areas targeted by this assay, and inadequate number of viral copies (<250 copies / mL). A negative result must be combined with clinical observations, patient history, and epidemiological information.  Fact Sheet for Patients:   BoilerBrush.com.cy  Fact Sheet for Healthcare Providers: https://pope.com/  This test is not yet approved or  cleared by the [place] FDA and has been authorized for detection and/or diagnosis of SARS-CoV-2 by FDA under an Emergency Use Authorization (EUA).  This EUA will remain in effect (meaning this test can be used) for the duration of the COVID-19 declaration under Section 564(b)(1) of the Act, 21 U.S.C. section 360bbb-3(b)(1), unless the authorization is terminated or revoked sooner.  Performed at [facility], [address], [place], [place] [address]       Radiology  Studies: No results found.     LOS: 4  days   Carneshia Raker [employer] on [email or web address]  05/02/2020, 11:19 AM

## 2020-05-03 LAB — CULTURE, BLOOD (ROUTINE X 2)
Culture: NO GROWTH
Culture: NO GROWTH
Special Requests: ADEQUATE
Special Requests: ADEQUATE

## 2020-05-03 MED ORDER — ONDANSETRON 4 MG PO TBDP
8.0000 mg | ORAL_TABLET | Freq: Three times a day (TID) | ORAL | Status: DC
  Administered 2020-05-03: 8 mg via ORAL
  Filled 2020-05-03: qty 2

## 2020-05-03 MED ORDER — ONDANSETRON 4 MG PO TBDP
8.0000 mg | ORAL_TABLET | Freq: Three times a day (TID) | ORAL | Status: DC
  Administered 2020-05-03 – 2020-05-05 (×6): 8 mg via ORAL
  Filled 2020-05-03 (×5): qty 2

## 2020-05-03 MED ORDER — GABAPENTIN 300 MG PO CAPS
600.0000 mg | ORAL_CAPSULE | Freq: Two times a day (BID) | ORAL | Status: DC
  Administered 2020-05-03 – 2020-05-05 (×5): 600 mg via ORAL
  Filled 2020-05-03 (×5): qty 2

## 2020-05-03 MED ORDER — SIMVASTATIN 20 MG PO TABS
20.0000 mg | ORAL_TABLET | Freq: Every day | ORAL | Status: DC
  Administered 2020-05-03 – 2020-05-04 (×2): 20 mg via ORAL
  Filled 2020-05-03 (×2): qty 1

## 2020-05-03 MED ORDER — HYDROCHLOROTHIAZIDE 25 MG PO TABS
12.5000 mg | ORAL_TABLET | Freq: Every day | ORAL | Status: DC
  Administered 2020-05-03 – 2020-05-05 (×3): 12.5 mg via ORAL
  Filled 2020-05-03 (×3): qty 1

## 2020-05-03 MED ORDER — BENAZEPRIL HCL 10 MG PO TABS
10.0000 mg | ORAL_TABLET | Freq: Every evening | ORAL | Status: DC
  Administered 2020-05-03 – 2020-05-04 (×2): 10 mg via ORAL
  Filled 2020-05-03 (×2): qty 1

## 2020-05-03 MED ORDER — ONDANSETRON 4 MG PO TBDP: 8.0000 mg | ORAL_TABLET | Freq: Three times a day (TID) | ORAL | Status: DC

## 2020-05-03 NOTE — Plan of Care (Signed)
  Problem: Health Behavior/Discharge Planning: Goal: Ability to manage health-related needs will improve Outcome: Progressing   Problem: Clinical Measurements: Goal: Ability to maintain clinical measurements within normal limits will improve Outcome: Progressing Goal: Will remain free from infection Outcome: Progressing Goal: Diagnostic test results will improve Outcome: Progressing   Problem: Activity: Goal: Risk for activity intolerance will decrease Outcome: Progressing   Problem: Pain Managment: Goal: General experience of comfort will improve Outcome: Progressing

## 2020-05-03 NOTE — TOC Progression Note (Addendum)
Transition of Care [facility]) - Progression Note    Patient Details  Name: Jeffery [name] MRN: [id number] Date of Birth: 1956/04/13  Transition of [facility] CM/SW Contact  Clearance Coots, LCSW Phone Number: 05/03/2020, 2:37 PM  Clinical Narrative:    CSW provided patient spouse with a list of Medicare. Gov snf rating, and notified spouse and daughter of the SNF's that made a bed offers.  [facility] insurance auth pending # [id number]  Pt. Daughter has requested CSW look into SNF's [place], [place] and [employer].      Expected Discharge Plan: Skilled Nursing Facility Barriers to Discharge: English as a second language teacher  Expected Discharge Plan and Services Expected Discharge Plan: Skilled Nursing Facility In-house Referral: Clinical Social Work Discharge Planning Services: Mobile Meals (CSW provided patient spouse information for Meal On Wheels.) Post Acute Care Choice: Home Health Living arrangements for the past 2 months: Single Family Home                 DME Arranged: Wheelchair manual DME Agency: [employer] Date DME Agency Contacted: 04/30/20 Time DME Agency Contacted: 1220 Representative spoke with at DME Agency: Oletha Cruel HH Arranged: PT, OT, Nurse's Aide HH Agency: [facility] Date [facility] Agency Contacted: 04/30/20 Time HH Agency Contacted: 1113 Representative spoke with at [facility] Agency: Amy Hyatt   Social Determinants of Health (SDOH) Interventions    Readmission Risk Interventions No flowsheet data found.

## 2020-05-03 NOTE — Progress Notes (Signed)
[facility] PROGRESS NOTE   Jeffery [name] GDJ:[id number] DOB: [birth year] DOA: 04/28/2020  PCP: Joycelyn Rua, MD  Brief History/Interval Summary: 64 yo male with PMH corticobasal syndrome (Parkinsonian), AVR (bioprosthetic), CAD s/p CABG x 1 (LIMA to LAD), HTN, HLD who presented to the ER with his wife due to complaints of worsening pain and swelling in his right face down to his right neck.  Symptoms have been ongoing for about 5 days.  Apparently seen by an outpatient provider who started the patient on Augmentin but due to worsening symptoms and fever at home he presented to the emergency department.  CT scan did not show any abscess but did show acute right parotitis.  Patient was hospitalized for further management.  Reason for Visit: Acute right parotitis with the surrounding cellulitis  Consultants: Phone discussion with ENT, Dr. Jearld Fenton  Procedures: None  Antibiotics: Anti-infectives (From admission, onward)   Start     Dose/Rate Route Frequency Ordered Stop   05/01/20 1200  amoxicillin-clavulanate (AUGMENTIN) 875-125 MG per tablet 1 tablet     Discontinue     1 tablet Oral Every 12 hours 05/01/20 1149     04/29/20 0200  Ampicillin-Sulbactam (UNASYN) 3 g in sodium chloride 0.9 % 100 mL IVPB  Status:  Discontinued        3 g 200 mL/hr over 30 Minutes Intravenous Every 6 hours 04/28/20 2355 05/01/20 1149   04/28/20 1900  Ampicillin-Sulbactam (UNASYN) 3 g in sodium chloride 0.9 % 100 mL IVPB        3 g 200 mL/hr over 30 Minutes Intravenous STAT 04/28/20 1857 04/28/20 2046      Subjective/Interval History: Patient feels well.  Face feels much better.  No new complaints offered.  He would prefer to go home if his choice of skilled nursing facility is not available.   Assessment/Plan:  Acute right parotitis with surrounding cellulitis Patient was started on Unasyn.  He was started on Augmentin but took it just for 1 day before he presented to the hospital.  Will not  consider that a failure of treatment.   Patient underwent CT scan which did not show any abscess.  The findings were discussed with Dr. Jearld Fenton with ENT.  Apart from antibiotics, heating pad and NSAIDs were also recommended.  Patient was given short course of Motrin.  Patient started improving.  He was changed over to Augmentin.  He was given probiotics and Imodium as needed for loose stools.  GI symptoms have improved. Facial swelling and the right parotitis is also improved.  History of Parkinson's/Corticobasal syndrome Followed by neurology.  Last seen in March.  Noted to be on Sinemet which is being continued.  Sometimes he apparently takes Zofran before his Sinemet.  Speech therapy consulted for swallow evaluation.  On dysphagia 2 diet currently.  Hypokalemia Repleted.  Essential hypertension Blood pressure noted to be borderline low in the emergency department.  His antihypertensives were placed on hold.   Over the last 48 hours his blood pressures have been climbing.  We will resume his home medications.    Normocytic anemia Mild.  No evidence of overt bleeding.  Outpatient evaluation.   DVT Prophylaxis: Subcutaneous heparin Code Status: Full code Family Communication: Discussed with the patient.  His wife was not in the room. Disposition Plan: PT and OT has seen the patient.  Skilled nursing facility has been recommended.  Family in  discussions with Child psychotherapist.    Status is: Inpatient  Remains inpatient  appropriate because:IV treatments appropriate due to intensity of illness or inability to take PO   Dispo:  Patient From: Home  Planned Disposition: Skilled Nursing Facility  Expected discharge date: 05/03/20  Medically stable for discharge: Yes      Medications:  Scheduled: . amoxicillin-clavulanate  1 tablet Oral Q12H  . carbidopa-levodopa  2 tablet Oral TID PC  . DULoxetine  30 mg Oral QPM  . DULoxetine  60 mg Oral Daily  . feeding supplement (ENSURE ENLIVE)   237 mL Oral BID BM  . heparin  5,000 Units Subcutaneous Q8H  . lidocaine  1 patch Transdermal Q24H  . melatonin  5 mg Oral QHS  . meloxicam  7.5 mg Oral BID  . multivitamin with minerals  1 tablet Oral Daily  . ondansetron  8 mg Oral TID  . pantoprazole  40 mg Oral Q1200  . saccharomyces boulardii  250 mg Oral BID  . sodium chloride flush  3 mL Intravenous Q12H   Continuous:  GDJ:MEQASTMHD, loperamide, morphine injection, oxyCODONE   Objective:  Vital Signs  Vitals:   05/02/20 1411 05/02/20 2109 05/03/20 0525 05/03/20 0525  BP: (!) 145/99 (!) 133/91 (!) 148/97 (!) 148/97  Pulse: 96 76 72 72  Resp: 18 16 16 16   Temp: 98.4 F (36.9 C) 98.3 F (36.8 C) 98.3 F (36.8 C) 98.3 F (36.8 C)  TempSrc: Oral Oral Oral Oral  SpO2: 97% 95% 97% 97%  Weight:      Height:        Intake/Output Summary (Last 24 hours) at 05/03/2020 1019 Last data filed at 05/03/2020 1000 Gross per 24 hour  Intake 1047 ml  Output 1600 ml  Net -553 ml   Filed Weights   04/28/20 1841  Weight: 90.7 kg    General appearance: Awake alert.  In no distress Right-sided facial swelling has improved.  No erythema.  Nontender. Resp: Clear to auscultation bilaterally.  Normal effort Cardio: S1-S2 is normal regular.  No S3-S4.  No rubs murmurs or bruit GI: Abdomen is soft.  Nontender nondistended.  Bowel sounds are present normal.  No masses organomegaly Extremities: No edema.   Neurologic: Alert and oriented x3.  Rigidity secondary to Parkinson's is noted.     Lab Results:  Data Reviewed: I have personally reviewed following labs and imaging studies  CBC: Recent Labs  Lab 04/28/20 1832 04/29/20 0308 04/30/20 0258 05/01/20 0332 05/02/20 0338  WBC 11.4* 8.3 5.1 5.1 5.8  NEUTROABS 9.0* 5.8  --   --   --   HGB 14.6 12.4* 12.8* 12.6* 13.6  HCT 43.6 37.3* 37.1* 38.1* 41.2  MCV 91.8 92.8 90.7 91.8 92.0  PLT 161 143* 155 194 226    Basic Metabolic Panel: Recent Labs  Lab 04/28/20 1832  04/28/20 1832 04/29/20 0308 04/29/20 1356 04/30/20 0258 05/01/20 0332 05/02/20 0338  NA 138  --  137  --  138 138 140  K 3.6   < > 2.8* 3.8 3.3* 3.8 3.9  CL 96*  --  103  --  103 104 103  CO2 30  --  26  --  24 27 28   GLUCOSE 115*  --  97  --  84 99 90  BUN 23  --  18  --  15 11 8   CREATININE 1.13  --  0.89  --  0.77 0.83 0.93  CALCIUM 9.3  --  8.4*  --  8.6* 8.6* 9.1  MG  --   --  1.8  --  1.9  --   --    < > = values in this interval not displayed.    GFR: Estimated Creatinine Clearance: 93.3 mL/min (by C-G formula based on SCr of 0.93 mg/dL).  Liver Function Tests: Recent Labs  Lab 04/28/20 1832  AST 23  ALT 15  ALKPHOS 50  BILITOT 1.1  PROT 7.2  ALBUMIN 3.8     Recent Results (from the past 240 hour(s))  Blood culture (routine x 2)     Status: None (Preliminary result)   Collection Time: 04/28/20  6:26 PM   Specimen: BLOOD LEFT HAND  Result Value Ref Range Status   Specimen Description   Final    BLOOD LEFT HAND Performed at [facility], [address] [place], [place] [address]    Special Requests   Final    BOTTLES DRAWN AEROBIC ONLY Blood Culture adequate volume Performed at [facility], [address], [place], [place] [address]    Culture   Final    NO GROWTH 4 DAYS Performed at [facility], [address] [place], [place] [address]    Report Status PENDING  Incomplete  Blood culture (routine x 2)     Status: None (Preliminary result)   Collection Time: 04/28/20  6:32 PM   Specimen: BLOOD  Result Value Ref Range Status   Specimen Description   Final    BLOOD RIGHT ANTECUBITAL Performed at [facility], [address], [place], [place] [address]    Special Requests   Final    BOTTLES DRAWN AEROBIC AND ANAEROBIC Blood Culture adequate volume Performed at [facility], [address], [place], [place] [address]    Culture   Final    NO GROWTH 4 DAYS Performed  at [facility], [address] [place], [place] [address]    Report Status PENDING  Incomplete  SARS Coronavirus 2 by RT PCR (hospital order, performed in [facility] hospital lab) Nasopharyngeal Nasopharyngeal Swab     Status: None   Collection Time: 04/28/20  9:41 PM   Specimen: Nasopharyngeal Swab  Result Value Ref Range Status   SARS Coronavirus 2 NEGATIVE NEGATIVE Final    Comment: (NOTE) SARS-CoV-2 target nucleic acids are NOT DETECTED.  The SARS-CoV-2 RNA is generally detectable in upper and lower respiratory specimens during the acute phase of infection. The lowest concentration of SARS-CoV-2 viral copies this assay can detect is 250 copies / mL. A negative result does not preclude SARS-CoV-2 infection and should not be used as the sole basis for treatment or other patient management decisions.  A negative result may occur with improper specimen collection / handling, submission of specimen other than nasopharyngeal swab, presence of viral mutation(s) within the areas targeted by this assay, and inadequate number of viral copies (<250 copies / mL). A negative result must be combined with clinical observations, patient history, and epidemiological information.  Fact Sheet for Patients:   BoilerBrush.com.cy  Fact Sheet for Healthcare Providers: https://pope.com/  This test is not yet approved or  cleared by the [place] FDA and has been authorized for detection and/or diagnosis of SARS-CoV-2 by FDA under an Emergency Use Authorization (EUA).  This EUA will remain in effect (meaning this test can be used) for the duration of the COVID-19 declaration under Section 564(b)(1) of the Act, 21 U.S.C. section 360bbb-3(b)(1), unless the authorization is terminated or revoked sooner.  Performed at [facility], [address] [place], [place] [id number]       Radiology Studies: No results  found.     LOS: 5 days   Jeffery [name]  [facility] Pager on [email or web address]  05/03/2020, 10:19 AM

## 2020-05-03 NOTE — Progress Notes (Signed)
Physical Therapy Treatment Patient Details Name: Jeffery [name] MRN: [id number] DOB: Jul 23, 1956 Today's Date: 05/03/2020    History of Present Illness 64 yo male with PMH corticobasal syndrome (Parkinsonian), AVR (bioprosthetic), CAD s/p CABG x 1 (LIMA to LAD), HTN, HLD who presented to the ER with his wife due to complaints of worsening pain and swelling in his right face down to his right neck. CT scan did not show any abscess but did show acute right parotitis with surrounding cellulitis.     PT Comments    General bed mobility comments: Pt required max assist B LE and upper body to transition to seated EOB.  Once upright, pt was able to static sit at Supervision level.  Assisted OOB using stedy.  General transfer comment: pt was able to pull self up from elevated bed then required assist for upright posture to "tuck hips in" to close stedy flaps.  Assisted to [facility] for a small BM.  Assisted to recliner.  General Gait Details: Used Clinical biochemist with + 2 side by side assist and spouse following with recliner.  Therapist had to advance walker as pt took very small shuffled steps.  Rigid and increased tremors with fatigue.  Follow Up Recommendations  SNF;Supervision/Assistance - 24 hour;Home health PT (spouse declining SNF due to visitor restrictions and poor ratings)     Equipment Recommendations       Recommendations for Other Services       Precautions / Restrictions Precautions Precautions: Fall Precaution Comments: Hx Parkinson's Restrictions Weight Bearing Restrictions: No    Mobility  Bed Mobility Overal bed mobility: Needs Assistance Bed Mobility: Supine to Sit     Supine to sit: Max assist     General bed mobility comments: Pt required max assist B LE and upper body to transition to seated EOB.  Once upright, pt was able to static sit at Supervision level.  Transfers     Transfers: Sit to/from Stand Sit to Stand: Min assist;Mod assist         General  transfer comment: pt was able to pull self up from elevated bed then required assist for upright posture to "tuck hips in" to close stedy flaps.  Assisted to [facility] for a small BM.  Assisted to recliner.  Ambulation/Gait Ambulation/Gait assistance: Mod assist;+2 physical assistance;+2 safety/equipment Gait Distance (Feet): 12 Feet Assistive device: Bilateral platform walker (EVA walker) Gait Pattern/deviations: Step-through pattern;Decreased stride length;Drifts right/left;Trunk flexed;Shuffle Gait velocity: decreased   General Gait Details: Used b Radiographer, therapeutic with + 2 side by side assist and spouse following with recliner.  Therapist had to advance walker as pt took very small shuffled steps.  Rigid and increased tremors with fatigue.   Stairs             Wheelchair Mobility    Modified Rankin (Stroke Patients Only)       Balance                                            Cognition     Overall Cognitive Status: Within Functional Limits for tasks assessed                                 General Comments: AxO x 3 pleasant      Exercises  General Comments        Pertinent Vitals/Pain Pain Assessment: No/denies pain    Home Living                      Prior Function            PT Goals (current goals can now be found in the care plan section) Progress towards PT goals: Progressing toward goals    Frequency    Min 2X/week      PT Plan Current plan remains appropriate    Co-evaluation              AM-PAC PT "6 Clicks" Mobility   Outcome Measure  Help needed turning from your back to your side while in a flat bed without using bedrails?: A Lot Help needed moving from lying on your back to sitting on the side of a flat bed without using bedrails?: A Lot Help needed moving to and from a bed to a chair (including a wheelchair)?: A Lot Help needed standing up from a chair using your arms (e.g.,  wheelchair or bedside chair)?: Total Help needed to walk in hospital room?: Total Help needed climbing 3-5 steps with a railing? : Total 6 Click Score: 9    End of Session Equipment Utilized During Treatment: Gait belt Activity Tolerance: Patient tolerated treatment well Patient left: in chair;with call bell/phone within reach;with family/visitor present Nurse Communication: Mobility status;Need for lift equipment PT Visit Diagnosis: Muscle weakness (generalized) (M62.81);Difficulty in walking, not elsewhere classified (R26.2);Other symptoms and signs involving the nervous system (R29.898);Other abnormalities of gait and mobility (R26.89);Unsteadiness on feet (R26.81)     Time: 1120-1150 PT Time Calculation (min) (ACUTE ONLY): 30 min  Charges:  $Gait Training: 8-22 mins $Therapeutic Activity: 8-22 mins                     {Lothar Prehn  PTA Acute  Rehabilitation Services Pager      [phone] Office      [phone]

## 2020-05-04 LAB — SARS CORONAVIRUS 2 BY RT PCR (HOSPITAL ORDER, PERFORMED IN ~~LOC~~ HOSPITAL LAB): SARS Coronavirus 2: NEGATIVE

## 2020-05-04 MED ORDER — PANTOPRAZOLE SODIUM 40 MG PO TBEC: 40.0000 mg | DELAYED_RELEASE_TABLET | Freq: Every day | ORAL | Status: DC

## 2020-05-04 MED ORDER — NYSTATIN 100000 UNIT/ML MT SUSP: 5.0000 mL | Freq: Four times a day (QID) | OROMUCOSAL | 0 refills | Status: DC

## 2020-05-04 MED ORDER — AMOXICILLIN-POT CLAVULANATE 875-125 MG PO TABS: 1.0000 | ORAL_TABLET | Freq: Two times a day (BID) | ORAL | 0 refills | Status: AC

## 2020-05-04 MED ORDER — ONDANSETRON 8 MG PO TBDP: 8.0000 mg | ORAL_TABLET | Freq: Three times a day (TID) | ORAL | 0 refills | Status: DC

## 2020-05-04 MED ORDER — NYSTATIN 100000 UNIT/ML MT SUSP
5.0000 mL | Freq: Four times a day (QID) | OROMUCOSAL | Status: DC
  Administered 2020-05-04 – 2020-05-05 (×5): 500000 [IU] via ORAL
  Filled 2020-05-04 (×5): qty 5

## 2020-05-04 MED ORDER — SACCHAROMYCES BOULARDII 250 MG PO CAPS: 250.0000 mg | ORAL_CAPSULE | Freq: Two times a day (BID) | ORAL | Status: DC

## 2020-05-04 MED ORDER — HYDROCODONE-ACETAMINOPHEN 5-325 MG PO TABS: 1.0000 | ORAL_TABLET | Freq: Three times a day (TID) | ORAL | 0 refills | Status: DC | PRN

## 2020-05-04 NOTE — [place] FL2 (Signed)
[place] MEDICAID FL2 LEVEL OF CARE SCREENING TOOL     IDENTIFICATION  Patient Name: Jeffery [name] Birthdate: 05-Mar-1956 Sex: male Admission Date (Current Location): 04/28/2020  [facility] and [place] Number:  Producer, television/film/video and Address:  [facility],  [address] [place], [place] [address]      Provider Number: [id number]  Attending Physician Name and Address:  Osvaldo Shipper, MD  Relative Name and Phone Number:  Kinser, Fellman [phone]  [phone]    Current Level of Care: Hospital Recommended Level of Care: Skilled Nursing Facility Prior Approval Number:    Date Approved/Denied:   PASRR Number:    Discharge Plan: SNF    Current Diagnoses: Patient Active Problem List   Diagnosis Date Noted  . Corticobasal syndrome (HCC) 04/29/2020  . Cellulitis 04/28/2020  . Left spastic hemiparesis (HCC) 11/11/2015  . S/P AVR (aortic valve replacement) 11/02/2014  . S/P aortic valve replacement with bioprosthetic valve + coronary artery bypass grafting 08/20/2013  . 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 Coronary artery disease 08/01/2013  . Incidental pulmonary nodule, > 60mm and < 88mm 07/23/2013  . Aortic stenosis 07/16/2013  . Hypertension 07/16/2013  . Osteoarthritis 07/16/2013  . Hypertriglyceridemia 07/16/2013  . Bicuspid aortic valve 07/16/2013  . Chest pain on exertion 07/16/2013  . Exertional shortness of breath 07/16/2013  . Amaurosis fugax 06/17/2011    Orientation RESPIRATION BLADDER Height & Weight     Self, Time, Situation, Place  Normal Incontinent Weight: 200 lb (90.7 kg) Height:  5\' 10"  (177.8 cm)  BEHAVIORAL SYMPTOMS/MOOD NEUROLOGICAL BOWEL NUTRITION STATUS      Incontinent Diet  AMBULATORY STATUS COMMUNICATION OF NEEDS Skin   Extensive Assist Verbally Normal                       Personal Care Assistance Level of Assistance  Bathing, Feeding, Dressing Bathing Assistance: Maximum assistance Feeding assistance: Limited  assistance Dressing Assistance: Maximum assistance     Functional Limitations Info  Sight, Hearing, Speech Sight Info: Impaired Hearing Info: Adequate Speech Info: Adequate    SPECIAL CARE FACTORS FREQUENCY  PT (By licensed PT), OT (By licensed OT)     PT Frequency: 5x/week OT Frequency: 5x/week            Contractures Contractures Info: Not present    Additional Factors Info  Code Status, Allergies Code Status Info: Fullcode Allergies Info: Allergies: No Known Allergies           Current Medications (05/04/2020):  This is the current hospital active medication list Current Facility-Administered Medications  Medication Dose Route Frequency Provider Last Rate Last Admin  . amoxicillin-clavulanate (AUGMENTIN) 875-125 MG per tablet 1 tablet  1 tablet Oral Q12H 05/06/2020, MD   1 tablet at 05/04/20 0948  . benazepril (LOTENSIN) tablet 10 mg  10 mg Oral QPM 05/06/20, MD   10 mg at 05/03/20 1733  . bisacodyl (DULCOLAX) suppository 10 mg  10 mg Rectal Daily PRN 05/05/20, MD   10 mg at 04/29/20 1732  . carbidopa-levodopa (SINEMET IR) 25-100 MG per tablet immediate release 2 tablet  2 tablet Oral TID 05/01/20, MD   2 tablet at 05/04/20 [phone]  . DULoxetine (CYMBALTA) DR capsule 30 mg  30 mg Oral QPM [id number], MD   30 mg at 05/03/20 1733  . DULoxetine (CYMBALTA) DR capsule 60 mg  60 mg Oral Daily 05/05/20, MD   60 mg at 05/04/20  1245  . feeding supplement (ENSURE ENLIVE) (ENSURE ENLIVE) liquid 237 mL  237 mL Oral BID BM Osvaldo Shipper, MD   237 mL at 05/04/20 0949  . gabapentin (NEURONTIN) capsule 600 mg  600 mg Oral BID Osvaldo Shipper, MD   600 mg at 05/04/20 0948  . heparin injection 5,000 Units  5,000 Units Subcutaneous Eliezer Lofts, MD   5,000 Units at 05/04/20 0523  . hydrochlorothiazide (HYDRODIURIL) tablet 12.5 mg  12.5 mg Oral Daily Osvaldo Shipper, MD   12.5 mg at 05/04/20 0948  . lidocaine (LIDODERM) 5 % 1 patch  1 patch  Transdermal Q24H Osvaldo Shipper, MD   1 patch at 05/04/20 0949  . loperamide (IMODIUM) capsule 4 mg  4 mg Oral TID PRN Osvaldo Shipper, MD      . melatonin tablet 5 mg  5 mg Oral Axel Filler, MD   5 mg at 05/03/20 2143  . meloxicam (MOBIC) tablet 7.5 mg  7.5 mg Oral BID Osvaldo Shipper, MD   7.5 mg at 05/04/20 0948  . morphine 2 MG/ML injection 2 mg  2 mg Intravenous Q2H PRN Lewie Chamber, MD   2 mg at 05/02/20 0800  . multivitamin with minerals tablet 1 tablet  1 tablet Oral Daily Osvaldo Shipper, MD   1 tablet at 05/04/20 0948  . nystatin (MYCOSTATIN) 100000 UNIT/ML suspension 500,000 Units  5 mL Oral QID Osvaldo Shipper, MD   500,000 Units at 05/04/20 [phone]  . ondansetron (ZOFRAN-ODT) disintegrating tablet 8 mg  8 mg Oral TID with meals Osvaldo Shipper, MD   8 mg at 05/04/20 8338  . oxyCODONE (Oxy IR/ROXICODONE) immediate release tablet 5 mg  5 mg Oral Q4H PRN Lewie Chamber, MD   5 mg at 05/04/20 0948  . pantoprazole (PROTONIX) EC tablet 40 mg  40 mg Oral Q1200 Osvaldo Shipper, MD   40 mg at 05/03/20 1100  . saccharomyces boulardii (FLORASTOR) capsule 250 mg  250 mg Oral BID Osvaldo Shipper, MD   250 mg at 05/04/20 0949  . simvastatin (ZOCOR) tablet 20 mg  20 mg Oral q1800 Osvaldo Shipper, MD   20 mg at 05/03/20 1733  . sodium chloride flush (NS) 0.9 % injection 3 mL  3 mL Intravenous Q12H Lewie Chamber, MD   3 mL at 05/04/20 2505     Discharge Medications: Please see discharge summary for a list of discharge medications.  Relevant Imaging Results:  Relevant Lab Results:   Additional Information ssn: [id number]  Clearance Coots, LCSW

## 2020-05-04 NOTE — Care Management Important Message (Signed)
Important Message  Patient Details IM Letter presented to the Patient Name: Jeffery [name] MRN: [id number] Date of Birth: March 25, 1956   Medicare Important Message Given:  Yes     Caren Macadam 05/04/2020, 12:14 PM

## 2020-05-04 NOTE — Discharge Instructions (Signed)
Parotitis  Parotitis is inflammation of one or both of your parotid glands. These glands produce saliva. They are found on each side of your face, below and in front of your earlobes. The saliva that they produce comes out of tiny openings (ducts) inside your cheeks. Parotitis may cause sudden swelling and pain (acute parotitis). It can also cause repeated episodes of swelling and pain or continued swelling that may or may not be painful (chronic parotitis). What are the causes? This condition may be caused by:  Infections from bacteria.  Infections from viruses, such as mumps or HIV.  Blockage (obstruction) of saliva flow through the parotid glands. This can be from a stone, scar tissue, or a tumor.  Diseases that cause your body's defense system (immune system) to attack healthy cells in your salivary glands. These are called autoimmune diseases. What increases the risk? You are more likely to develop this condition if:  You are 50 years old or older.  You do not drink enough fluids (are dehydrated).  You drink too much alcohol.  You have: ? A dry mouth. ? Poor dental hygiene. ? Diabetes. ? Gout. ? A long-term illness.  You have had radiation treatments to the head and neck.  You take certain medicines. What are the signs or symptoms? Symptoms of this condition depend on the cause. Symptoms may include:  Swelling under and in front of the ear. This may get worse after eating.  Redness of the skin over the parotid gland.  Pain and tenderness over the parotid gland. This may get worse after eating.  Fever or chills.  Pus coming from the ducts inside the mouth.  Dry mouth.  A bad taste in the mouth. How is this diagnosed? This condition may be diagnosed based on:  Your medical history.  A physical exam.  Tests to find the cause of the parotitis. These may include: ? Doing blood tests to check for an autoimmune disease or infections from a virus. ? Taking a  fluid sample from the parotid gland and testing it for infection. ? Injecting the ducts of the parotid gland with a dye and then taking X-rays (sialogram). ? Having other imaging tests of the gland, such as X-rays, ultrasound, MRI, or CT scan. ? Checking the opening of the gland for a stone or obstruction. ? Placing a needle into the gland to remove tissue for a biopsy (fine needle aspiration). How is this treated? Treatment for this condition depends on the cause. Treatment may include:  Antibiotic medicine for a bacterial infection.  Drinking more fluids.  Removing a stone or obstruction.  Treating an underlying disease that is causing parotitis.  Surgery to drain an infection, remove a growth, or remove the whole gland (parotidectomy). Treatment may not be needed if parotid swelling goes away with home care. Follow these instructions at home: Medicines   Take over-the-counter and prescription medicines only as told by your health care provider.  If you were prescribed an antibiotic medicine, take it as told by your health care provider. Do not stop taking the antibiotic even if you start to feel better. Managing pain and swelling  If directed, apply heat to the affected area as often as told by your health care provider. Use the heat source that your health care provider recommends, such as a moist heat pack or a heating pad. To apply the heat: ? Place a towel between your skin and the heat source. ? Leave the heat on for 20-30 minutes. ?   Remove the heat if your skin turns bright red. This is especially important if you are unable to feel pain, heat, or cold. You may have a greater risk of getting burned.  Gargle with a salt-water mixture 3-4 times a day or as needed. To make a salt-water mixture, completely dissolve -1 tsp (3-6 g) of salt in 1 cup (237 mL) of warm water.  Gently massage the parotid glands as told by your health care provider. General instructions   Drink  enough fluid to keep your urine pale yellow.  Keep your mouth clean and moist.  Try sucking on sour candy. This may help to make your mouth less dry by stimulating the flow of saliva.  Maintain good oral health. ? Brush your teeth at least two times a day. ? Floss your teeth every day. ? See your dentist regularly.  Do not use any products that contain nicotine or tobacco, such as cigarettes, e-cigarettes, and chewing tobacco. If you need help quitting, ask your health care provider.  Do not drink alcohol.  Keep all follow-up visits as told by your health care provider. This is important. Contact a health care provider if:  You have a fever or chills.  You have new symptoms.  Your symptoms get worse.  Your symptoms do not improve with treatment. Get help right away if:  You have difficulty breathing or swallowing because of the swollen gland. Summary  Parotitis is inflammation of one or both of your parotid glands.  Symptoms include pain and swelling under and in front of the ear. They may also include a fever and a bad taste in your mouth.  This condition may be treated with antibiotics, increasing fluids, or surgery.  In some cases, parotitis may go away on its own without treatment.  You should drink plenty of fluids, maintain good oral hygiene, and avoid tobacco products. This information is not intended to replace advice given to you by your health care provider. Make sure you discuss any questions you have with your health care provider. Document Revised: 04/23/2018 Document Reviewed: 04/23/2018 [employer] Patient Education  2020 [employer]  

## 2020-05-04 NOTE — Discharge Summary (Signed)
[facility]  Physician Discharge Summary   Patient ID: Jeffery [name] MRN: [id number] DOB/AGE: 14-Feb-1956 64 y.o.  Admit date: 04/28/2020 Discharge date: 05/04/2020  PCP: Joycelyn Rua, MD  DISCHARGE DIAGNOSES:  Acute right parotitis with surrounding cellulitis, improved History of for cortical basal syndrome/Parkinson's Essential hypertension Coronary artery disease Hyperlipidemia   RECOMMENDATIONS FOR OUTPATIENT FOLLOW UP: 1. Palliative care to be consulted at SNF 2. Check basic metabolic panel early next week.     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  Symptoms have been ongoing for about 5 days.  Apparently seen by an outpatient provider who started the patient on Augmentin but due to worsening symptoms and fever at home he presented to the emergency department.  CT scan did not show any abscess but did show acute right parotitis.  Patient was hospitalized for further management.  Consultations:  Full discussion with ENT, Dr. Jearld Fenton  Procedures:  None   HOSPITAL COURSE:   Acute right parotitis with surrounding cellulitis Patient was started on Unasyn.  He was started on Augmentin but took it just for 1 day before he presented to the hospital.  Will not consider that a failure of treatment.   Patient underwent CT scan which did not show any abscess.  The findings were discussed with Dr. Jearld Fenton with ENT.  Apart from antibiotics, heating pad and NSAIDs were also recommended.  Patient was given short course of Motrin.  Patient started improving.   He was changed over to Augmentin.  He was given probiotics and Imodium as needed for loose stools.  GI symptoms have improved. Facial swelling and the right parotitis is also improved.  History of Parkinson's/Corticobasal syndrome Followed by neurology.  Last seen in March.  Noted to be on Sinemet which is being continued.  Sometimes he apparently takes Zofran before his Sinemet.  Speech therapy consulted for swallow evaluation.  On dysphagia 2 diet currently.  Hypokalemia Repleted.  History of coronary artery disease Stable.  Continue aspirin.  Essential hypertension Initially his antihypertensives were held due to borderline low blood pressures.  Now they have been resumed.  Normocytic anemia Hemoglobin stable.  Noted to be normal when last checked.   Overall stable.  Okay for discharge to SNF today.   PERTINENT LABS:  The results of significant diagnostics from this hospitalization (including imaging, microbiology, ancillary and laboratory) are listed below for reference.    Microbiology: Recent Results (from the past 240 hour(s))  Blood culture (routine x 2)     Status: None   Collection Time: 04/28/20  6:26 PM   Specimen: BLOOD LEFT HAND  Result Value Ref Range Status   Specimen Description   Final    BLOOD LEFT HAND Performed at [facility], [address], [place], [place] [address]    Special Requests   Final    BOTTLES DRAWN AEROBIC ONLY Blood Culture adequate volume Performed at [facility], [address], [place], [place] [address]    Culture   Final    NO GROWTH 5 DAYS Performed at [facility], [address] [place], [place] [address]    Report Status 05/03/2020 FINAL  Final  Blood culture (routine x 2)     Status: None   Collection Time: 04/28/20  6:32 PM   Specimen: BLOOD  Result Value Ref Range Status   Specimen Description   Final    BLOOD RIGHT ANTECUBITAL Performed at [facility], [address], [place], [place] [address]    Special Requests   Final    BOTTLES DRAWN AEROBIC AND ANAEROBIC Blood Culture adequate volume Performed at [facility], [address] [place], [place] [address]    Culture   Final    NO GROWTH 5 DAYS Performed at [facility], [address] [place], [place] [address]    Report Status 05/03/2020 FINAL  Final  SARS Coronavirus 2 by RT PCR (hospital order, performed in [facility] hospital lab) Nasopharyngeal Nasopharyngeal Swab     Status: None   Collection Time: 04/28/20  9:41 PM   Specimen: Nasopharyngeal Swab  Result Value Ref Range Status   SARS Coronavirus 2 NEGATIVE NEGATIVE Final    Comment: (NOTE) SARS-CoV-2 target nucleic acids are NOT DETECTED.  The SARS-CoV-2 RNA is generally detectable in upper and lower respiratory specimens during the acute phase of infection. The lowest concentration of SARS-CoV-2 viral copies this assay can detect is 250 copies / mL. A negative result does not preclude SARS-CoV-2 infection and should not be used as the sole basis for treatment or other patient management decisions.  A negative result may occur with improper specimen collection / handling, submission of specimen other than nasopharyngeal swab, presence of viral mutation(s) within the areas targeted by this assay, and inadequate number of viral copies (<250 copies / mL). A negative result must be combined with clinical observations, patient history, and epidemiological information.  Fact Sheet for Patients:   BoilerBrush.com.cy  Fact Sheet for Healthcare Providers: https://pope.com/  This test is not yet approved or  cleared by the [place] FDA and has been authorized for detection and/or diagnosis of SARS-CoV-2 by FDA under an Emergency Use Authorization (EUA).  This EUA will remain in effect (meaning this test can be used) for the duration of  the COVID-19 declaration under Section 564(b)(1) of the Act, 21 U.S.C. section 360bbb-3(b)(1), unless the authorization is terminated or revoked sooner.  Performed at [facility], [address] [place], [place] [address]      Labs:  COVID-19 Labs   Lab Results  Component Value Date   SARSCOV2NAA NEGATIVE 04/28/2020      Basic Metabolic Panel: Recent Labs  Lab 04/28/20 1832 04/28/20 1832 04/29/20 0308 04/29/20 1356 04/30/20 0258 05/01/20 0332 05/02/20 0338  NA 138  --  137  --  138 138 140  K 3.6   < > 2.8* 3.8 3.3* 3.8 3.9  CL 96*  --  103  --  103 104 103  CO2 30  --  26  --  24 27 28   GLUCOSE 115*  --  97  --  84 99 90  BUN 23  --  18  --  15 11 8   CREATININE 1.13  --  0.89  --  0.77 0.83 0.93  CALCIUM 9.3  --  8.4*  --  8.6* 8.6* 9.1  MG  --   --  1.8  --  1.9  --   --    < > = values in this interval  not displayed.   Liver Function Tests: Recent Labs  Lab 04/28/20 1832  AST 23  ALT 15  ALKPHOS 50  BILITOT 1.1  PROT 7.2  ALBUMIN 3.8   CBC: Recent Labs  Lab 04/28/20 1832 04/29/20 0308 04/30/20 0258 05/01/20 0332 05/02/20 0338  WBC 11.4* 8.3 5.1 5.1 5.8  NEUTROABS 9.0* 5.8  --   --   --   HGB 14.6 12.4* 12.8* 12.6* 13.6  HCT 43.6 37.3* 37.1* 38.1* 41.2  MCV 91.8 92.8 90.7 91.8 92.0  PLT 161 143* 155 194 226     IMAGING STUDIES CT Soft Tissue Neck W Contrast  Result Date: 04/28/2020 CLINICAL DATA:  Initial evaluation for acute cellulitis. EXAM: CT NECK WITH CONTRAST TECHNIQUE: Multidetector CT imaging of the neck was performed using the standard protocol following the bolus administration of intravenous contrast. CONTRAST:  25mL OMNIPAQUE IOHEXOL 300 MG/ML  SOLN COMPARISON:  None. FINDINGS: Pharynx and larynx: Oral cavity within normal limits without discrete mass or collection. Palatine tonsils symmetric and within normal limits. No tonsillar or peritonsillar abscess. Mild mucosal edema noted within the right oropharynx  related to the acute inflammatory process within the right face. Remainder of the oropharynx and nasopharynx otherwise within normal limits. Trace retropharyngeal effusion, likely reactive. No frank retropharyngeal abscess. Epiglottis normal. Vallecula clear. Remainder of the hypopharynx and supraglottic larynx within normal limits. True cords grossly within normal limits allowing for patient positioning. Subglottic airway clear. Salivary glands: Asymmetric enlargement with irregularity and hyperenhancement involving the right parotid gland, consistent with acute parotitis. No obstructive stone seen within Stensen's duct. Associated swelling with inflammatory stranding throughout the adjacent right face, with involvement of the right parotid, masticator, parapharyngeal, and submandibular spaces. Additional inflammatory stranding extends inferiorly along the anterior aspect of the right neck and upper chest wall. No discrete abscess or drainable fluid collection. Left parotid gland within normal limits. Submandibular glands within normal limits. Punctate stone noted at the right floor of mouth, likely in right Wharton's duct. No associated ductal dilatation. Thyroid: Normal. Lymph nodes: Mildly prominent right level II lymph nodes measure up to 1 cm, likely reactive. No other pathologically enlarged lymph nodes identified within the neck. Vascular: Normal intravascular enhancement seen throughout the neck. Few small foci of gas lucency within the right submandibular space likely lie within the venous system, suspected be related IV access. Mild-to-moderate atheromatous change about the aortic arch and carotid siphons. Limited intracranial: Unremarkable. Visualized orbits: Unremarkable. Mastoids and visualized paranasal sinuses: Paranasal sinuses are largely clear. Small chronic appearing bilateral mastoid effusions, of doubtful significance. Middle ear cavities are well pneumatized and free of fluid. Skeleton: No  acute osseous abnormality. No discrete or worrisome osseous lesions. Moderate multilevel cervical spondylosis, most notable at C5-6 and C6-7. Median sternotomy wires noted. Upper chest: Visualized upper chest demonstrates no acute finding. Scattered atelectatic changes noted within the visualized lungs. Other: None. IMPRESSION: 1. Findings consistent with acute right parotitis with associated regional cellulitis as above. No obstructive stone, abscess, or other complication identified. 2. Punctate stone at the right floor of mouth, likely in the right Wharton's duct. No associated ductal dilatation. 3. Mildly prominent right level II lymph nodes, likely reactive. Electronically Signed   By: Rise Mu M.D.   On: 04/28/2020 20:52    DISCHARGE EXAMINATION: Vitals:   05/03/20 1421 05/03/20 1732 05/03/20 2049 05/04/20 0614  BP: (!) 137/90 (!) 136/99 (!) 136/92 (!) 124/94  Pulse: 79 75 77 73  Resp: 17  16 18   Temp:  98.5 F (36.9 C) 97.7 F (36.5 C)  TempSrc:   Oral Oral  SpO2: 94%  95% 94%  Weight:      Height:       General appearance: Awake alert.  In no distress Swelling over the right face has improved significantly.  No erythema.  Nontender. Resp: Clear to auscultation bilaterally.  Normal effort Cardio: S1-S2 is normal regular.  No S3-S4.  No rubs murmurs or bruit GI: Abdomen is soft.  Nontender nondistended.  Bowel sounds are present normal.  No masses organomegaly Rigidity noted due to history of Parkinson   DISPOSITION: SNF  Discharge Instructions    Call MD for:  difficulty breathing, headache or visual disturbances   Complete by: As directed    Call MD for:  extreme fatigue   Complete by: As directed    Call MD for:  persistant dizziness or light-headedness   Complete by: As directed    Call MD for:  persistant nausea and vomiting   Complete by: As directed    Call MD for:  severe uncontrolled pain   Complete by: As directed    Call MD for:  temperature >100.4    Complete by: As directed    Discharge instructions   Complete by: As directed    Please review instructions on the discharge summary.  You were cared for by a hospitalist during your hospital stay. If you have any questions about your discharge medications or the care you received while you were in the hospital after you are discharged, you can call the unit and asked to speak with the hospitalist on call if the hospitalist that took care of you is not available. Once you are discharged, your primary care physician will handle any further medical issues. Please note that NO REFILLS for any discharge medications will be authorized once you are discharged, as it is imperative that you return to your primary care physician (or establish a relationship with a primary care physician if you do not have one) for your aftercare needs so that they can reassess your need for medications and monitor your lab values. If you do not have a primary care physician, you can call [phone] for a physician referral.   Increase activity slowly   Complete by: As directed         Allergies as of 05/04/2020   No Known Allergies     Medication List    STOP taking these medications   amoxicillin 500 MG capsule Commonly known as: AMOXIL     TAKE these medications   amoxicillin-clavulanate 875-125 MG tablet Commonly known as: AUGMENTIN Take 1 tablet by mouth every 12 (twelve) hours for 8 days. What changed: when to take this   aspirin EC 81 MG tablet Take 1 tablet (81 mg total) by mouth daily. What changed: when to take this   benazepril 10 MG tablet Commonly known as: LOTENSIN Take 10 mg by mouth every evening.   carbidopa-levodopa 25-100 MG tablet Commonly known as: SINEMET IR Take 2 tablets by mouth 3 (three) times daily.   cetirizine 10 MG tablet Commonly known as: ZYRTEC Take 10 mg by mouth daily.   co-enzyme Q-10 30 MG capsule Take 30 mg by mouth at bedtime.   DHA-EPA-VITAMIN E PO Take 1  tablet by mouth every evening.   docusate sodium 100 MG capsule Commonly known as: COLACE Take 200 mg by mouth at bedtime.   DULoxetine 30 MG capsule Commonly known as: CYMBALTA Take 1  capsule by mouth every evening.   DULoxetine 60 MG capsule Commonly known as: CYMBALTA Take 60 mg by mouth every morning.   Fish Oil 500 MG Caps Take 1 capsule by mouth daily.   gabapentin 300 MG capsule Commonly known as: NEURONTIN Take 600 mg by mouth 2 (two) times daily.   glucosamine-chondroitin 500-400 MG tablet Take 1 tablet by mouth 2 (two) times daily.   hydrochlorothiazide 25 MG tablet Commonly known as: HYDRODIURIL TAKE 0.5 TABLETS (12.5 MG TOTAL) BY MOUTH DAILY.   HYDROcodone-acetaminophen 5-325 MG tablet Commonly known as: NORCO/VICODIN Take 1 tablet by mouth every 8 (eight) hours as needed for moderate pain or severe pain.   melatonin 5 MG Tabs Take 5 mg by mouth at bedtime.   meloxicam 7.5 MG tablet Commonly known as: MOBIC Take 1 tablet by mouth 2 (two) times daily.   MENS MULTIVITAMIN PLUS PO Take 1 tablet by mouth daily.   nystatin 100000 UNIT/ML suspension Commonly known as: MYCOSTATIN Take 5 mLs (500,000 Units total) by mouth 4 (four) times daily.   ondansetron 8 MG disintegrating tablet Commonly known as: ZOFRAN-ODT Take 1 tablet (8 mg total) by mouth with breakfast, with lunch, and with evening meal.   pantoprazole 40 MG tablet Commonly known as: PROTONIX Take 1 tablet (40 mg total) by mouth daily at 12 noon.   PROSTATE PO Take 1 tablet by mouth daily.   saccharomyces boulardii 250 MG capsule Commonly known as: FLORASTOR Take 1 capsule (250 mg total) by mouth 2 (two) times daily.   simvastatin 20 MG tablet Commonly known as: ZOCOR TAKE 1 TABLET BY MOUTH EVERY DAY AT 6PM   tiZANidine 4 MG tablet Commonly known as: ZANAFLEX Take 4 mg by mouth 2 (two) times daily.            Durable Medical Equipment  (From admission, onward)         Start      Ordered   04/30/20 1154  For home use only DME standard manual wheelchair with seat cushion  Once       Comments: Patient suffers from corticobasal syndrome/parkinsons which impairs their ability to perform daily activities like bathing, dressing, grooming, and toileting in the home.  A walker will not resolve issue with performing activities of daily living. A wheelchair will allow patient to safely perform daily activities. Patient can safely propel the wheelchair in the home or has a caregiver who can provide assistance. Length of need Lifetime. Accessories: elevating leg rests (ELRs), wheel locks, extensions and anti-tippers.   04/30/20 1154            Follow-up Information    Joycelyn Rua, MD. Schedule an appointment as soon as possible for a visit in 2 week(s).   Specialty: Family Medicine Contact information: [address] [place] [address] [phone]               TOTAL DISCHARGE TIME: 35 minutes  Jeffery [name] Rito Ehrlich  [facility] Pager on [email or web address]  05/04/2020, 11:01 AM

## 2020-05-04 NOTE — TOC Progression Note (Addendum)
Transition of Care [facility]) - Progression Note    Patient Details  Name: Jeffery [name] MRN: [id number] Date of Birth: 1955/11/09  Transition of Care [facility]) CM/SW Contact  Clearance Coots, LCSW Phone Number: 05/04/2020, 12:45 PM  Clinical Narrative:    Patient and daughter chose [facility], pt. Is not vaccninated therefore will have to do a 14 quarantine at [facility] w/no visitors. Patient, spouse and his daughter agreeable.  Covid test resulted negative.  [facility] health, still pending.  Patient will transport by [facility].    Expected Discharge Plan: Skilled Nursing Facility Barriers to Discharge: English as a second language teacher  Expected Discharge Plan and Services Expected Discharge Plan: Skilled Nursing Facility In-house Referral: Clinical Social Work Discharge Planning Services: Mobile Meals (CSW provided patient spouse information for Meal On Wheels.) Post Acute Care Choice: Home Health Living arrangements for the past 2 months: Single Family Home Expected Discharge Date: 05/04/20               DME Arranged: Wheelchair manual DME Agency: [employer] Date DME Agency Contacted: 04/30/20 Time DME Agency Contacted: 1220 Representative spoke with at DME Agency: Oletha Cruel HH Arranged: PT, OT, Nurse's Aide HH Agency: [facility] Date [facility] Agency Contacted: 04/30/20 Time HH Agency Contacted: 1113 Representative spoke with at [facility] Agency: Amy Hyatt   Social Determinants of Health (SDOH) Interventions    Readmission Risk Interventions No flowsheet data found.

## 2020-05-04 NOTE — Progress Notes (Signed)
Transition of Care (TOC) -30 day Note          Patient Details   Name: Jeffery [name]  MRN: [id number]  Date of Birth: 1956-04-01     Transition of Care [facility]) CM/SW Contact   Name: Vivi Barrack  Phone Number: [phone]  Date: 05/04/2020  Time: 12:13PM     MUST ID: [id number]     To Whom it May Concern:     Please be advised that the above patient will require a short-term nursing home stay, anticipated 30 days or less rehabilitation and strengthening. The plan is for return home.

## 2020-05-05 NOTE — Progress Notes (Signed)
[facility]                                                                              Patient Demographics  Jeffery DolphinJerrell [name], is a 64 y.o. male, DOB - [birth year], WUJ:[id number]  Admit date - 04/28/2020   Admitting Physician Lewie Chamberavid Girguis, MD  Outpatient Primary MD for the patient is Joycelyn RuaMeyers, Stephen, MD  Outpatient specialists:   LOS - 7  days   Medical records reviewed and are as summarized below:    Chief Complaint  Patient presents with  . 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 Symptoms have been ongoing for about 5 days. Apparently seen by an outpatient provider who started the patient on Augmentin but due to worsening symptoms and fever at home he presented to the emergency department. CT scan did not show any abscess but did show acute right parotitis. Patient was hospitalized for further management.   Assessment & Plan     Acute right parotitis with surrounding cellulitis -Patient was started on Unasyn, patient was placed on Augmentin outpatient but had only taken it for a day before he presented to the hospital.  Hence not considered a failure of treatment. - underwent CT scan which did not show any abscess. The findings were discussed with Dr. Jearld FentonByers with ENT. Apart from antibiotics, heating pad and NSAIDs were also recommended. Patient was given short course of Motrin.  -Improving, transitioned to Augmentin -Facial swelling and right parotitis is also improved.   History of Parkinson's/Corticobasal syndrome -Followed by neurology, last seen in 12/2019 -Continue Sinemet, sometimes takes Zofran before his Sinemet. -Patient was followed by speech therapy, on dysphagia 2 diet  currently  Hypokalemia Resolved  History of coronary artery disease Stable.  Continue aspirin.  Essential hypertension Initially his antihypertensives were held due to borderline low blood pressures, currently resumed.   Normocytic anemia H&H stable    Code Status: Full code Family Communication: Discussed all imaging results, lab results, explained to the patient and wife at the bedside    Disposition Plan:     Status is: Inpatient.  Patient is medically stable for discharge.  Discharge summary done by Dr. Rito EhrlichKrishnan on 05/04/2020 still stands, no changes.    Dispo:  Patient From: Home  Planned Disposition: Skilled Nursing Facility  Expected discharge date: 05/05/20  Medically stable for discharge: Yes       Time Spent in minutes   25 minutes  Procedures:  None  Consultants:   ENT, Dr. Jearld FentonByers  Antimicrobials:   Anti-infectives (From admission, onward)   Start     Dose/Rate Route Frequency Ordered Stop   05/04/20 0000  amoxicillin-clavulanate (AUGMENTIN) 875-125 MG tablet     Discontinue     1 tablet Oral Every 12 hours 05/04/20 1100 05/12/20 2359   05/01/20 1200  amoxicillin-clavulanate (AUGMENTIN) 875-125 MG per tablet 1 tablet  Discontinue     1 tablet Oral Every 12 hours 05/01/20 1149     04/29/20 0200  Ampicillin-Sulbactam (UNASYN) 3 g in sodium chloride 0.9 % 100 mL IVPB  Status:  Discontinued        3 g 200 mL/hr over 30 Minutes Intravenous Every 6 hours 04/28/20 2355 05/01/20 1149   04/28/20 1900  Ampicillin-Sulbactam (UNASYN) 3 g in sodium chloride 0.9 % 100 mL IVPB        3 g 200 mL/hr over 30 Minutes Intravenous STAT 04/28/20 1857 04/28/20 2046          Medications  Scheduled Meds: . amoxicillin-clavulanate  1 tablet Oral Q12H  . benazepril  10 mg Oral QPM  . carbidopa-levodopa  2 tablet Oral TID PC  . DULoxetine  30 mg Oral QPM  . DULoxetine  60 mg Oral Daily  . feeding supplement (ENSURE ENLIVE)  237 mL Oral BID BM  . gabapentin   600 mg Oral BID  . heparin  5,000 Units Subcutaneous Q8H  . hydrochlorothiazide  12.5 mg Oral Daily  . lidocaine  1 patch Transdermal Q24H  . melatonin  5 mg Oral QHS  . meloxicam  7.5 mg Oral BID  . multivitamin with minerals  1 tablet Oral Daily  . nystatin  5 mL Oral QID  . ondansetron  8 mg Oral TID with meals  . pantoprazole  40 mg Oral Q1200  . saccharomyces boulardii  250 mg Oral BID  . simvastatin  20 mg Oral q1800  . sodium chloride flush  3 mL Intravenous Q12H   Continuous Infusions: PRN Meds:.bisacodyl, loperamide, morphine injection, oxyCODONE      Subjective:   Jeffery [name] was seen and examined today.  No acute issues, occasional cough, wife at the bedside.  No fevers or chills.  No acute events overnight  Objective:   Vitals:   05/04/20 0614 05/04/20 1348 05/04/20 2230 05/05/20 0525  BP: (!) 124/94 121/85 (!) 136/75 (!) 130/87  Pulse: 73 87 85 73  Resp: 18 17 18 16   Temp: 97.7 F (36.5 C) 98 F (36.7 C) 98 F (36.7 C) 98 F (36.7 C)  TempSrc: Oral Oral Oral   SpO2: 94% 96% 98% 96%  Weight:      Height:        Intake/Output Summary (Last 24 hours) at 05/05/2020 0913 Last data filed at 05/05/2020 0525 Gross per 24 hour  Intake 720 ml  Output 1300 ml  Net -580 ml     Wt Readings from Last 3 Encounters:  04/28/20 90.7 kg  06/11/19 93 kg  03/18/18 92.9 kg     Exam  General: Alert and oriented x 3, NAD  Swelling over the right face improving.  No erythema or tenderness.  Cardiovascular: S1 S2 auscultated, no murmurs, RRR  Respiratory: Clear to auscultation bilaterally, no wheezing, rales or rhonchi  Gastrointestinal: Soft, nontender, nondistended, + bowel sounds  Ext: no pedal edema bilaterally  Neuro: no new deficits  Musculoskeletal: No digital cyanosis, clubbing  Skin: No rashes  Psych: Normal affect and demeanor, alert and oriented x3    Data Reviewed:  I have personally reviewed following labs and imaging studies  Micro  Results Recent Results (from the past 240 hour(s))  Blood culture (routine x 2)     Status: None   Collection Time: 04/28/20  6:26 PM   Specimen: BLOOD LEFT HAND  Result Value Ref Range Status   Specimen Description   Final  BLOOD LEFT HAND Performed at [facility], [address], [place], [place] [address]    Special Requests   Final    BOTTLES DRAWN AEROBIC ONLY Blood Culture adequate volume Performed at [facility], [address], [place], [place] [address]    Culture   Final    NO GROWTH 5 DAYS Performed at [facility], [address], [place], [place] [address]    Report Status 05/03/2020 FINAL  Final  Blood culture (routine x 2)     Status: None   Collection Time: 04/28/20  6:32 PM   Specimen: BLOOD  Result Value Ref Range Status   Specimen Description   Final    BLOOD RIGHT ANTECUBITAL Performed at [facility], [address], [place], [place] [address]    Special Requests   Final    BOTTLES DRAWN AEROBIC AND ANAEROBIC Blood Culture adequate volume Performed at [facility], [address] [place], [place] [address]    Culture   Final    NO GROWTH 5 DAYS Performed at [facility], [address], [place], [place] [address]    Report Status 05/03/2020 FINAL  Final  SARS Coronavirus 2 by RT PCR (hospital order, performed in [facility] hospital lab) Nasopharyngeal Nasopharyngeal Swab     Status: None   Collection Time: 04/28/20  9:41 PM   Specimen: Nasopharyngeal Swab  Result Value Ref Range Status   SARS Coronavirus 2 NEGATIVE NEGATIVE Final    Comment: (NOTE) SARS-CoV-2 target nucleic acids are NOT DETECTED.  The SARS-CoV-2 RNA is generally detectable in upper and lower respiratory specimens during the acute phase of infection. The lowest concentration of SARS-CoV-2 viral copies this assay can detect is 250 copies / mL. A negative result does not preclude SARS-CoV-2  infection and should not be used as the sole basis for treatment or other patient management decisions.  A negative result may occur with improper specimen collection / handling, submission of specimen other than nasopharyngeal swab, presence of viral mutation(s) within the areas targeted by this assay, and inadequate number of viral copies (<250 copies / mL). A negative result must be combined with clinical observations, patient history, and epidemiological information.  Fact Sheet for Patients:   BoilerBrush.com.cy  Fact Sheet for Healthcare Providers: https://pope.com/  This test is not yet approved or  cleared by the [place] FDA and has been authorized for detection and/or diagnosis of SARS-CoV-2 by FDA under an Emergency Use Authorization (EUA).  This EUA will remain in effect (meaning this test can be used) for the duration of the COVID-19 declaration under Section 564(b)(1) of the Act, 21 U.S.C. section 360bbb-3(b)(1), unless the authorization is terminated or revoked sooner.  Performed at [facility], [address] [place], [place] [address]   SARS Coronavirus 2 by RT PCR (hospital order, performed in [facility] hospital lab) Nasopharyngeal Nasopharyngeal Swab     Status: None   Collection Time: 05/04/20  9:39 AM   Specimen: Nasopharyngeal Swab  Result Value Ref Range Status   SARS Coronavirus 2 NEGATIVE NEGATIVE Final    Comment: (NOTE) SARS-CoV-2 target nucleic acids are NOT DETECTED.  The SARS-CoV-2 RNA is generally detectable in upper and lower respiratory specimens during the acute phase of infection. The lowest concentration of SARS-CoV-2 viral copies this assay can detect is 250 copies / mL. A negative result does not preclude SARS-CoV-2 infection and should not be used as the  sole basis for treatment or other patient management decisions.  A negative result may occur with improper  specimen collection / handling, submission of specimen other than nasopharyngeal swab, presence of viral mutation(s) within the areas targeted by this assay, and inadequate number of viral copies (<250 copies / mL). A negative result must be combined with clinical observations, patient history, and epidemiological information.  Fact Sheet for Patients:   BoilerBrush.com.cy  Fact Sheet for Healthcare Providers: https://pope.com/  This test is not yet approved or  cleared by the [place] FDA and has been authorized for detection and/or diagnosis of SARS-CoV-2 by FDA under an Emergency Use Authorization (EUA).  This EUA will remain in effect (meaning this test can be used) for the duration of the COVID-19 declaration under Section 564(b)(1) of the Act, 21 U.S.C. section 360bbb-3(b)(1), unless the authorization is terminated or revoked sooner.  Performed at [facility], [address] [place], [place] [address]     Radiology Reports CT Soft Tissue Neck W Contrast  Result Date: 04/28/2020 CLINICAL DATA:  Initial evaluation for acute cellulitis. EXAM: CT NECK WITH CONTRAST TECHNIQUE: Multidetector CT imaging of the neck was performed using the standard protocol following the bolus administration of intravenous contrast. CONTRAST:  38mL OMNIPAQUE IOHEXOL 300 MG/ML  SOLN COMPARISON:  None. FINDINGS: Pharynx and larynx: Oral cavity within normal limits without discrete mass or collection. Palatine tonsils symmetric and within normal limits. No tonsillar or peritonsillar abscess. Mild mucosal edema noted within the right oropharynx related to the acute inflammatory process within the right face. Remainder of the oropharynx and nasopharynx otherwise within normal limits. Trace retropharyngeal effusion, likely reactive. No frank retropharyngeal abscess. Epiglottis normal. Vallecula clear. Remainder of the hypopharynx and  supraglottic larynx within normal limits. True cords grossly within normal limits allowing for patient positioning. Subglottic airway clear. Salivary glands: Asymmetric enlargement with irregularity and hyperenhancement involving the right parotid gland, consistent with acute parotitis. No obstructive stone seen within Stensen's duct. Associated swelling with inflammatory stranding throughout the adjacent right face, with involvement of the right parotid, masticator, parapharyngeal, and submandibular spaces. Additional inflammatory stranding extends inferiorly along the anterior aspect of the right neck and upper chest wall. No discrete abscess or drainable fluid collection. Left parotid gland within normal limits. Submandibular glands within normal limits. Punctate stone noted at the right floor of mouth, likely in right Wharton's duct. No associated ductal dilatation. Thyroid: Normal. Lymph nodes: Mildly prominent right level II lymph nodes measure up to 1 cm, likely reactive. No other pathologically enlarged lymph nodes identified within the neck. Vascular: Normal intravascular enhancement seen throughout the neck. Few small foci of gas lucency within the right submandibular space likely lie within the venous system, suspected be related IV access. Mild-to-moderate atheromatous change about the aortic arch and carotid siphons. Limited intracranial: Unremarkable. Visualized orbits: Unremarkable. Mastoids and visualized paranasal sinuses: Paranasal sinuses are largely clear. Small chronic appearing bilateral mastoid effusions, of doubtful significance. Middle ear cavities are well pneumatized and free of fluid. Skeleton: No acute osseous abnormality. No discrete or worrisome osseous lesions. Moderate multilevel cervical spondylosis, most notable at C5-6 and C6-7. Median sternotomy wires noted. Upper chest: Visualized upper chest demonstrates no acute finding. Scattered atelectatic changes noted within the  visualized lungs. Other: None. IMPRESSION: 1. Findings consistent with acute right parotitis with associated regional cellulitis as above. No obstructive stone, abscess, or other complication identified. 2. Punctate stone at the right floor of mouth, likely in the right Wharton's duct. No  associated ductal dilatation. 3. Mildly prominent right level II lymph nodes, likely reactive. Electronically Signed   By: Rise Mu M.D.   On: 04/28/2020 20:52    Lab Data:  CBC: Recent Labs  Lab 04/28/20 1832 04/29/20 0308 04/30/20 0258 05/01/20 0332 05/02/20 0338  WBC 11.4* 8.3 5.1 5.1 5.8  NEUTROABS 9.0* 5.8  --   --   --   HGB 14.6 12.4* 12.8* 12.6* 13.6  HCT 43.6 37.3* 37.1* 38.1* 41.2  MCV 91.8 92.8 90.7 91.8 92.0  PLT 161 143* 155 194 226   Basic Metabolic Panel: Recent Labs  Lab 04/28/20 1832 04/28/20 1832 04/29/20 0308 04/29/20 1356 04/30/20 0258 05/01/20 0332 05/02/20 0338  NA 138  --  137  --  138 138 140  K 3.6   < > 2.8* 3.8 3.3* 3.8 3.9  CL 96*  --  103  --  103 104 103  CO2 30  --  26  --  GLUCOSE 115*  --  97  --  84 99 90  BUN 23  --  18  --  CREATININE 1.13  --  0.89  --  0.77 0.83 0.93  CALCIUM 9.3  --  8.4*  --  8.6* 8.6* 9.1  MG  --   --  1.8  --  1.9  --   --    < > = values in this interval not displayed.   GFR: Estimated Creatinine Clearance: 93.3 mL/min (by C-G formula based on SCr of 0.93 mg/dL). Liver Function Tests: Recent Labs  Lab 04/28/20 1832  AST 23  ALT 15  ALKPHOS 50  BILITOT 1.1  PROT 7.2  ALBUMIN 3.8   No results for input(s): LIPASE, AMYLASE in the last 168 hours. No results for input(s): AMMONIA in the last 168 hours. Coagulation Profile: No results for input(s): INR, PROTIME in the last 168 hours. Cardiac Enzymes: No results for input(s): CKTOTAL, CKMB, CKMBINDEX, TROPONINI in the last 168 hours. BNP (last 3 results) No results for input(s): PROBNP in the last 8760 hours. HbA1C: No results for  input(s): HGBA1C in the last 72 hours. CBG: No results for input(s): GLUCAP in the last 168 hours. Lipid Profile: No results for input(s): CHOL, HDL, LDLCALC, TRIG, CHOLHDL, LDLDIRECT in the last 72 hours. Thyroid Function Tests: No results for input(s): TSH, T4TOTAL, FREET4, T3FREE, THYROIDAB in the last 72 hours. Anemia Panel: No results for input(s): VITAMINB12, FOLATE, FERRITIN, TIBC, IRON, RETICCTPCT in the last 72 hours. Urine analysis:    Component Value Date/Time   COLORURINE YELLOW 04/29/2020 0000   APPEARANCEUR CLEAR 04/29/2020 0000   LABSPEC 1.020 04/29/2020 0000   PHURINE 5.0 04/29/2020 0000   GLUCOSEU NEGATIVE 04/29/2020 0000   HGBUR NEGATIVE 04/29/2020 0000   BILIRUBINUR NEGATIVE 04/29/2020 0000   KETONESUR NEGATIVE 04/29/2020 0000   PROTEINUR NEGATIVE 04/29/2020 0000   UROBILINOGEN 0.2 08/18/2013 1225   NITRITE NEGATIVE 04/29/2020 0000   LEUKOCYTESUR NEGATIVE 04/29/2020 0000     Carrington Olazabal M.D. [facility] 05/05/2020, 9:13 AM   Call night coverage person covering after 7pm

## 2020-05-05 NOTE — TOC Transition Note (Addendum)
Transition of Care [facility]) - CM/SW Discharge Note   Patient Details  Name: Jeffery [name] MRN: [id number] Date of Birth: 1955-11-27  Transition of Care [facility]) CM/SW Contact:  Clearance Coots, LCSW Phone Number: 05/05/2020, 9:39 AM   Clinical Narrative:    Insurance Authorization received. 7/27-7/29 approved 3 days [id number] [employer] Auth. Vesta Mixer. [id number], details provided to SNF,  PTAR to transport.  Nurse call report to: [phone] Room 513   Final next level of care: Skilled Nursing Facility Barriers to Discharge: Barriers Resolved   Patient Goals and CMS Choice Patient states their goals for this hospitalization and ongoing recovery are:: Go to rehab facility CMS Medicare.gov Compare Post Acute Care list provided to:: Patient Choice offered to / list presented to : Patient  Discharge Placement PASRR number recieved: 05/04/20            Patient chooses bed at: [facility] Patient to be transferred to facility by: PTAR Name of family member notified: Spouse-Quevedo,[name] at bedside Patient and family notified of of transfer: 05/05/20  Discharge Plan and Services In-house Referral: Clinical Social Work Discharge Planning Services: Mobile Meals (CSW provided patient spouse information for [employer] Wheels.) Post Acute Care Choice: Home Health          DME Arranged: Wheelchair manual DME Agency: [employer] Date DME Agency Contacted: 04/30/20 Time DME Agency Contacted: 1220 Representative spoke with at DME Agency: Oletha Cruel HH Arranged: PT, OT, Nurse's Aide HH Agency: [facility] Date [facility] Agency Contacted: 04/30/20 Time HH Agency Contacted: 1113 Representative spoke with at [facility] Agency: Amy Hyatt  Social Determinants of Health (SDOH) Interventions     Readmission Risk Interventions No flowsheet data found.

## 2020-05-05 NOTE — Progress Notes (Signed)
Report provided to the receiving nurse, Ellard Artis, at [facility]. Monet denied any further questions. Pt waiting for PTAR for transport to the facility.

## 2020-05-07 ENCOUNTER — Non-Acute Institutional Stay (SKILLED_NURSING_FACILITY): Payer: Medicare Other | Admitting: Internal Medicine

## 2020-05-07 ENCOUNTER — Encounter: Payer: Self-pay | Admitting: Internal Medicine

## 2020-05-07 DIAGNOSIS — G3185 Corticobasal degeneration: Secondary | ICD-10-CM | POA: Diagnosis not present

## 2020-05-07 DIAGNOSIS — I10 Essential (primary) hypertension: Secondary | ICD-10-CM

## 2020-05-07 DIAGNOSIS — Z952 Presence of prosthetic heart valve: Secondary | ICD-10-CM | POA: Diagnosis not present

## 2020-05-07 DIAGNOSIS — Z951 Presence of aortocoronary bypass graft: Secondary | ICD-10-CM

## 2020-05-07 DIAGNOSIS — Z66 Do not resuscitate: Secondary | ICD-10-CM

## 2020-05-07 DIAGNOSIS — L03221 Cellulitis of neck: Secondary | ICD-10-CM | POA: Diagnosis not present

## 2020-05-07 DIAGNOSIS — K1121 Acute sialoadenitis: Secondary | ICD-10-CM | POA: Diagnosis not present

## 2020-05-07 DIAGNOSIS — Z7189 Other specified counseling: Secondary | ICD-10-CM

## 2020-05-07 DIAGNOSIS — I2510 Atherosclerotic heart disease of native coronary artery without angina pectoris: Secondary | ICD-10-CM

## 2020-05-07 DIAGNOSIS — E781 Pure hyperglyceridemia: Secondary | ICD-10-CM

## 2020-05-07 NOTE — Progress Notes (Signed)
Provider:  Gwenith Spitziffany L. Renato [name], D.O., C.M.D. [facility]:  [facility] Room Number: [phone] Place of Service:  SNF (31)  PCP: Jeffery RuaMeyers, Stephen, MD Patient Care Team: Jeffery RuaMeyers, Stephen, MD as PCP - General [facility](Family Medicine)  Extended Emergency Contact Information Primary Emergency Contact: Jeffery [name] Address: [address]          [place], [place] [address] [place] Home Phone: [phone] Mobile Phone: [phone] Relation: Spouse Secondary Emergency Contact: Jeffery [name]          [place], [place] [address] [place] Home Phone: [phone] Mobile Phone: [phone] Relation: Daughter  Code Status:  DNR Goals of Care: Advanced Directive information Advanced Directives 05/07/2020  Does Patient Have a Medical Advance Directive? Yes  Type of Advance Directive Living will;Healthcare Power of [place];Out of facility DNR (pink MOST or yellow form)  Does patient want to make changes to medical advance directive? No - Patient declined  Copy of Healthcare Power of Attorney in Chart? No - copy requested  Pre-existing out of facility DNR order (yellow form or pink MOST form) -   Chief Complaint  Patient presents with  . New Admit To SNF    New admit to SNF     HPI: Patient is a 64 y.o. male with a history of cortical basal syndrome (parkinsonian syndrome), bioprosthetic aortic valve replacement, coronary artery disease status post CABG x1 (LIMA to LAD), hypertension, hyperlipidemia seen today [facility] status post hospitalization at [facility] from July 21 to May 05, 2020. Jeffery [name] had presented to the emergency room on July 21 with his wife at bedside.  He had begun feeling bad few days prior and was noted to have swelling around his right ear.  This got progressively worse and the swelling extended down the right side of his neck.  He was seen in his PCPs office on Tuesday and received a shot and  was given a prescription for Augmentin.  His symptoms continue to get worse and he began having confusion and fever of 102.  That is when they came to the emergency room.  He did not have any discharge from the swollen area.  He has been having excessive salivation due to his Parkinson's disease.  Emergency room he underwent a CT of the neck which revealed acute parotitis but no obstructive stone.  There was a punctate stone in the right floor the mouth.  He also had a little bit of right-sided lymphadenopathy mentioned.  He also had surrounding cellulitis.  He was given a dose of Unasyn in the emergency room and admitted for further IV Unasyn 3 g IV every 6 hours antibiotics and monitoring in case of need for surgical evaluation.  Pain was managed with morphine.  He was also placed on DVT prophylaxis with subcutaneous Lovenox.  His admission WBC count was 11.4 with a mild left shift.  His electrolytes and kidney function appeared normal.  Its noted that his cortical basal syndrome does not respond to Sinemet but his wife has insisted that he remain on the medication.  At the time of his admission he was noted to be bedbound, but when I spoke with his wife, he's usually up in a chair or his wheelchair throughout the day.  He ambulates short distances in the house with his walker like to the bathroom from the bedroom.    During his hospitalization he was noted to have dysphagia as well and  speech therapy recommended dysphagia 2 diet.  During his admission Dr. Jearld [name] from ENT was consulted but patient fortunately did not require surgery.  He was treated with the antibiotics heating pad and NSAIDs.  He had a short course of Motrin therapy.  Unasyn was changed over to Augmentin (he continues this here for 8 more days) and he was given probiotics and Imodium if needed for loose stools.  His facial swelling and parotitis did improve.  His discharge WBC count was 5.8.  Creatinine was 0.93.  He now has hydrocodone for  moderate to severe pain.  Later in his stay he had hypokalemia which was repleted.  He was hypotensive at admission so his blood pressure medicines were held however blood pressure did trend upward and they were later resumed.  He had a negative Covid test on July 21.  He has not had his Covid vaccines.  We discussed that he is high risk for death with his comorbidities and they both agreed with getting him the vaccines.  His wife prefers he get J&J b/c she's read it has fewer neurologic side effects and he's prone to these.    When I saw him, his wife was with him helping him eat his dinner.  His appetite is poor and has been for some time.  He's lost about 20 lbs.    Past Medical History:  Diagnosis Date  . Amaurosis fugax   . Amaurosis fugax 2012  . Aortic stenosis   . Bicuspid aortic valve   . Calculus of gallbladder   . Chest pain on exertion   . CHF (congestive heart failure) (HCC)   . Cholecystitis   . Coronary artery disease 08/01/2013   LAD stenosis  . Dysrhythmia   . Exertional shortness of breath   . Heart murmur   . Hypertension   . Hypertriglyceridemia   . Incidental pulmonary nodule, > 41mm and < 92mm 07/23/2013   5 mm L lung nodule and 4 mm R lung nodule seen incidentally on chest CT  . Osteoarthritis   . S/P aortic valve replacement with bioprosthetic valve 08/20/2013   25 mm [facility] Ease bovine pericardial tissue valve  . 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 AORTIC VALVE REPLACEMENT N/A 08/20/2013   Procedure: AORTIC VALVE REPLACEMENT (AVR);  Surgeon: Jeffery Nails, MD;  Location: [facility] OR;  Service: Open Heart Surgery;  Laterality: N/A;  . CHOLECYSTECTOMY    . CORONARY ARTERY BYPASS GRAFT N/A 08/20/2013   Procedure: CORONARY ARTERY BYPASS GRAFTING (CABG);  Surgeon: Jeffery Nails, MD;  Location: [facility] OR;  Service: Open Heart Surgery;  Laterality: N/A;  CABG x one, using left internal mammary artery    . INTRAOPERATIVE TRANSESOPHAGEAL ECHOCARDIOGRAM N/A 08/20/2013   Procedure: INTRAOPERATIVE TRANSESOPHAGEAL ECHOCARDIOGRAM;  Surgeon: Jeffery Nails, MD;  Location: [facility] OR;  Service: Open Heart Surgery;  Laterality: N/A;  . LEFT AND RIGHT HEART CATHETERIZATION WITH CORONARY ANGIOGRAM N/A 08/01/2013   Procedure: LEFT AND RIGHT HEART CATHETERIZATION WITH CORONARY ANGIOGRAM;  Surgeon: Donato Schultz, MD;  Location: [facility] CATH LAB;  Service: Cardiovascular;  Laterality: N/A;  . NOSE SURGERY    . umbillical hernia      Social History   Socioeconomic History  . Marital status: Married    Spouse name: Not on file  . Number of children: Not on file  . Years of education: Not on file  . Highest education  level: Not on file  Occupational History  . Not on file  Tobacco Use  . Smoking status: Former Smoker    Quit date: 10/09/1992    Years since quitting: 27.6  . Smokeless tobacco: Former Neurosurgeon  . Tobacco comment: chewed on cigars  Vaping Use  . Vaping Use: Never used  Substance and Sexual Activity  . Alcohol use: Yes    Alcohol/week: 1.0 standard drink    Types: 1 Cans of beer per week    Comment: 1 beer a month  . Drug use: No  . Sexual activity: Not on file  Other Topics Concern  . Not on file  Social History Narrative   Lives with wife and daughter in a one story home.  Has 2 daughters.     Works as an Personnel officer.  Education: high school.   Social Determinants of Health   Financial Resource Strain:   . Difficulty of Paying Living Expenses:   Food Insecurity:   . Worried About Programme researcher, broadcasting/film/video in the Last Year:   . Barista in the Last Year:   Transportation Needs:   . Freight forwarder (Medical):   Marland Kitchen Lack of Transportation (Non-Medical):   Physical Activity:   . Days of Exercise per Week:   . Minutes of Exercise per Session:   Stress:   . Feeling of Stress :   Social Connections:   . Frequency of Communication with Friends and Family:   . Frequency of Social  Gatherings with Friends and Family:   . Attends Religious Services:   . Active Member of Clubs or Organizations:   . Attends Banker Meetings:   Marland Kitchen Marital Status:     reports that he quit smoking about 27 years ago. He has quit using smokeless tobacco. He reports current alcohol use of about 1.0 standard drink of alcohol per week. He reports that he does not use drugs.  Functional Status Survey:    Family History  Problem Relation Age of Onset  . Alzheimer's disease Mother 15  . CAD Father        MI  . Heart attack Father 3  . Arthritis-Osteo Brother   . Allergic rhinitis Sister   . Colon polyps Daughter   . Heart attack Paternal Uncle   . Other Paternal Grandfather        Aortic Valve disorder  . Heart attack Paternal Uncle   . Heart attack Paternal Uncle   . Healthy Daughter     Health Maintenance  Topic Date Due  . Hepatitis C Screening  Never done  . COVID-19 Vaccine (1) Never done  . HIV Screening  Never done  . TETANUS/TDAP  Never done  . COLONOSCOPY  Never done  . INFLUENZA VACCINE  05/09/2020    No Known Allergies  Outpatient Encounter Medications as of 05/07/2020  Medication Sig  . amoxicillin-clavulanate (AUGMENTIN) 875-125 MG tablet Take 1 tablet by mouth every 12 (twelve) hours for 8 days.  Marland Kitchen aspirin 81 MG EC tablet Take 81 mg by mouth daily. Swallow whole. 1 tablet by mouth daily  . benazepril (LOTENSIN) 10 MG tablet Take 10 mg by mouth every evening.   . carbidopa-levodopa (SINEMET IR) 25-100 MG tablet Take 2 tablets by mouth 3 (three) times daily.  . cetirizine (ZYRTEC) 10 MG tablet Take 10 mg by mouth daily.  Marland Kitchen co-enzyme Q-10 30 MG capsule Take 30 mg by mouth at bedtime.  . DHA-EPA-VITAMIN E PO Take  1 tablet by mouth every evening.   . docusate sodium (COLACE) 100 MG capsule Take 200 mg by mouth at bedtime.  . DULoxetine (CYMBALTA) 30 MG capsule Take 1 capsule by mouth every evening.   . DULoxetine (CYMBALTA) 60 MG capsule Take 60 mg by  mouth every morning.   . gabapentin (NEURONTIN) 300 MG capsule Take 600 mg by mouth 2 (two) times daily.   Marland Kitchen glucosamine-chondroitin 500-400 MG tablet Take 1 tablet by mouth 2 (two) times daily.  . hydrochlorothiazide (HYDRODIURIL) 12.5 MG tablet Take 12.5 mg by mouth daily.  Marland Kitchen HYDROcodone-acetaminophen (NORCO/VICODIN) 5-325 MG tablet Take 1 tablet by mouth every 8 (eight) hours as needed for moderate pain or severe pain.  . melatonin 5 MG TABS Take 5 mg by mouth at bedtime.  . meloxicam (MOBIC) 7.5 MG tablet Take 1 tablet by mouth 2 (two) times daily.  . Multiple Vitamins-Minerals (MENS MULTIVITAMIN PLUS PO) Take 1 tablet by mouth daily.  Marland Kitchen nystatin (MYCOSTATIN) 100000 UNIT/ML suspension Take 5 mLs (500,000 Units total) by mouth 4 (four) times daily.  . Omega-3 Fatty Acids (FISH OIL) 500 MG CAPS Take 1 capsule by mouth daily.   . ondansetron (ZOFRAN-ODT) 8 MG disintegrating tablet Take 1 tablet (8 mg total) by mouth with breakfast, with lunch, and with evening meal.  . pantoprazole (PROTONIX) 40 MG tablet Take 1 tablet (40 mg total) by mouth daily at 12 noon.  . saccharomyces boulardii (FLORASTOR) 250 MG capsule Take 1 capsule (250 mg total) by mouth 2 (two) times daily.  . simvastatin (ZOCOR) 20 MG tablet TAKE 1 TABLET BY MOUTH EVERY DAY AT 6PM  . Specialty Vitamins Products (PROSTATE PO) Take 1 tablet by mouth daily.  Marland Kitchen tiZANidine (ZANAFLEX) 4 MG tablet Take 4 mg by mouth 2 (two) times daily.  . [DISCONTINUED] aspirin EC 81 MG tablet Take 1 tablet (81 mg total) by mouth daily. (Patient taking differently: Take 81 mg by mouth every evening. )  . [DISCONTINUED] hydrochlorothiazide (HYDRODIURIL) 25 MG tablet TAKE 0.5 TABLETS (12.5 MG TOTAL) BY MOUTH DAILY.   No facility-administered encounter medications on file as of 05/07/2020.    Review of Systems  Constitutional: Positive for malaise/fatigue and weight loss. Negative for chills and fever.  HENT: Positive for hearing loss. Negative for  congestion and sore throat.        Swelling has resolved; small area in right neck remains a bit tender  Eyes: Negative for blurred vision.       Glasses  Respiratory: Negative for cough and shortness of breath.   Cardiovascular: Negative for chest pain, palpitations and leg swelling.  Gastrointestinal: Positive for constipation. Negative for abdominal pain, blood in stool, diarrhea, heartburn and melena.  Genitourinary: Negative for dysuria.  Musculoskeletal: Positive for back pain and joint pain.       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  Neurological: Positive for weakness. Negative for dizziness and headaches.  Psychiatric/Behavioral:       His wife helps provide history but he is able to answer most things correctly on his own, slow responses    Vitals:   05/07/20 1352  BP: (!) 131/78  Pulse: 83  Temp: (!) 97.3 F (36.3 C)  SpO2: 94%  Weight: 200 lb (90.7 kg)  Height:  (1.778 m)   Body mass index is 28.7 kg/m. Physical Exam  Labs reviewed: Basic Metabolic Panel: Recent Labs  04/29/20 0308 04/29/20 1356 04/30/20 0258 05/01/20 0332 05/02/20 0338  NA 137  --  138 138 140  K 2.8*   < > 3.3* 3.8 3.9  CL 103  --  103 104 103  CO2 26  --  24 27 28   GLUCOSE 97  --  84 99 90  BUN 18  --  15 11 8   CREATININE 0.89  --  0.77 0.83 0.93  CALCIUM 8.4*  --  8.6* 8.6* 9.1  MG 1.8  --  1.9  --   --    < > = values in this interval not displayed.   Liver Function Tests: Recent Labs    04/28/20 1832  AST 23  ALT 15  ALKPHOS 50  BILITOT 1.1  PROT 7.2  ALBUMIN 3.8   No results for input(s): LIPASE, AMYLASE in the last 8760 hours. No results for input(s): AMMONIA in the last 8760 hours. CBC: Recent Labs    04/28/20 1832 04/28/20 1832 04/29/20 0308 04/29/20 0308 04/30/20 0258 05/01/20 0332 05/02/20 0338  WBC 11.4*   < > 8.3   < > 5.1 5.1 5.8  NEUTROABS 9.0*  --  5.8  --   --    --   --   HGB 14.6   < > 12.4*   < > 12.8* 12.6* 13.6  HCT 43.6   < > 37.3*   < > 37.1* 38.1* 41.2  MCV 91.8   < > 92.8   < > 90.7 91.8 92.0  PLT 161   < > 143*   < > 155 194 226   < > = values in this interval not displayed.   Cardiac Enzymes: No results for input(s): CKTOTAL, CKMB, CKMBINDEX, TROPONINI in the last 8760 hours. BNP: Invalid input(s): POCBNP Lab Results  Component Value Date   HGBA1C 5.4 08/18/2013   Lab Results  Component Value Date   TSH 2.490 02/27/2017   Lab Results  Component Value Date   VITAMINB12 413 05/05/2016   No results found for: FOLATE No results found for: IRON, TIBC, FERRITIN  Imaging and Procedures obtained prior to SNF admission: CT Soft Tissue Neck W Contrast  Result Date: 04/28/2020 CLINICAL DATA:  Initial evaluation for acute cellulitis. EXAM: CT NECK WITH CONTRAST TECHNIQUE: Multidetector CT imaging of the neck was performed using the standard protocol following the bolus administration of intravenous contrast. CONTRAST:  31mL OMNIPAQUE IOHEXOL 300 MG/ML  SOLN COMPARISON:  None. FINDINGS: Pharynx and larynx: Oral cavity within normal limits without discrete mass or collection. Palatine tonsils symmetric and within normal limits. No tonsillar or peritonsillar abscess. Mild mucosal edema noted within the right oropharynx related to the acute inflammatory process within the right face. Remainder of the oropharynx and nasopharynx otherwise within normal limits. Trace retropharyngeal effusion, likely reactive. No frank retropharyngeal abscess. Epiglottis normal. Vallecula clear. Remainder of the hypopharynx and supraglottic larynx within normal limits. True cords grossly within normal limits allowing for patient positioning. Subglottic airway clear. Salivary glands: Asymmetric enlargement with irregularity and hyperenhancement involving the right parotid gland, consistent with acute parotitis. No obstructive stone seen within Stensen's duct. Associated  swelling with inflammatory stranding throughout the adjacent right face, with involvement of the right parotid, masticator, parapharyngeal, and submandibular spaces. Additional inflammatory stranding extends inferiorly along the anterior aspect of the right neck and upper chest wall. No discrete abscess or drainable fluid collection. Left parotid gland within normal limits. Submandibular glands within normal limits. Punctate stone noted at the right floor  of mouth, likely in right Wharton's duct. No associated ductal dilatation. Thyroid: Normal. Lymph nodes: Mildly prominent right level II lymph nodes measure up to 1 cm, likely reactive. No other pathologically enlarged lymph nodes identified within the neck. Vascular: Normal intravascular enhancement seen throughout the neck. Few small foci of gas lucency within the right submandibular space likely lie within the venous system, suspected be related IV access. Mild-to-moderate atheromatous change about the aortic arch and carotid siphons. Limited intracranial: Unremarkable. Visualized orbits: Unremarkable. Mastoids and visualized paranasal sinuses: Paranasal sinuses are largely clear. Small chronic appearing bilateral mastoid effusions, of doubtful significance. Middle ear cavities are well pneumatized and free of fluid. Skeleton: No acute osseous abnormality. No discrete or worrisome osseous lesions. Moderate multilevel cervical spondylosis, most notable at C5-6 and C6-7. Median sternotomy wires noted. Upper chest: Visualized upper chest demonstrates no acute finding. Scattered atelectatic changes noted within the visualized lungs. Other: None. IMPRESSION: 1. Findings consistent with acute right parotitis with associated regional cellulitis as above. No obstructive stone, abscess, or other complication identified. 2. Punctate stone at the right floor of mouth, likely in the right Wharton's duct. No associated ductal dilatation. 3. Mildly prominent right level II  lymph nodes, likely reactive. Electronically Signed   By: Rise Mu M.D.   On: 04/28/2020 20:52    Assessment/Plan 1. Acute parotitis -completing course of abx--8 more days from time of d/c 7/27 -educated some more on this, importance of hydration to help get rid of residual stones  2. Cellulitis of neck -related to #1, completing abx and resolved  3. Corticobasal syndrome (HCC) -uses wheelchair at home and walker short distances, but now extremely weak after infection so here for PT, OT to get stronger to go back home with his wife--currently she cannot care for him fully  -uses muscle relaxers and gabapentin as well as cymbalta for pain  4. S/P AVR (aortic valve replacement) -doing fine in this regard, followed by cardiology  5. Coronary artery disease involving native coronary artery of native heart without angina pectoris -no active symptoms, cont bp, lipid control  6. S/P CABG x 1 -doing fine since  7. Essential hypertension -bp controlled here today, cont same regimen, monitor  8. Hypertriglyceridemia -on zocor which has been continued -may improve with weight loss he's had unintentionally  9. DNR (do not resuscitate) - Do not attempt resuscitation (DNR) confirmed here  10.  Counseled about covid-19 virus infection -I've written an order for him to get the J+J vaccine when pharmacy next brings a supply  Family/ staff Communication: discussed his care with his wife and nursing   Cynitha Berte L. Jasreet Dickie, D.O. Geriatrics [facility] [address], [place] [address] Cell Phone (Mon-Fri 8am-5pm):  [phone] On Call:  [phone] & follow prompts after 5pm & weekends Office Phone:  [phone] Office Fax:  [phone]

## 2020-05-11 LAB — CBC AND DIFFERENTIAL
HCT: 45 (ref 41–53)
Hemoglobin: 15.6 (ref 13.5–17.5)
Neutrophils Absolute: 5
Platelets: 281 (ref 150–399)
WBC: 7.3

## 2020-05-11 LAB — CBC: RBC: 4.99 (ref 3.87–5.11)

## 2020-05-11 LAB — BASIC METABOLIC PANEL
BUN: 17 (ref 4–21)
CO2: 24 — AB (ref 13–22)
Chloride: 99 (ref 99–108)
Creatinine: 0.9 (ref 0.6–1.3)
Glucose: 80
Potassium: 4.2 (ref 3.4–5.3)
Sodium: 139 (ref 137–147)

## 2020-05-11 LAB — COMPREHENSIVE METABOLIC PANEL
Calcium: 9.7 (ref 8.7–10.7)
GFR calc Af Amer: 90
GFR calc non Af Amer: 90

## 2020-05-17 ENCOUNTER — Telehealth: Payer: Self-pay

## 2020-05-17 NOTE — Telephone Encounter (Signed)
Received referral for Palliative Care. Phone call placed to facility to follow up and schedule visit with Palliative care. VM left with social workers at facility.

## 2020-05-20 ENCOUNTER — Non-Acute Institutional Stay: Payer: Self-pay

## 2020-05-20 DIAGNOSIS — Z515 Encounter for palliative care: Secondary | ICD-10-CM

## 2020-05-21 ENCOUNTER — Other Ambulatory Visit: Payer: Self-pay | Admitting: Internal Medicine

## 2020-05-21 DIAGNOSIS — M545 Low back pain, unspecified: Secondary | ICD-10-CM

## 2020-05-21 DIAGNOSIS — G8929 Other chronic pain: Secondary | ICD-10-CM

## 2020-05-21 MED ORDER — HYDROCODONE-ACETAMINOPHEN 5-325 MG PO TABS: 1.0000 | ORAL_TABLET | Freq: Every day | ORAL | 0 refills | Status: DC

## 2020-05-27 ENCOUNTER — Non-Acute Institutional Stay (SKILLED_NURSING_FACILITY): Payer: Medicare Other | Admitting: Family

## 2020-05-27 ENCOUNTER — Non-Acute Institutional Stay: Payer: Medicare Other | Admitting: Internal Medicine

## 2020-05-27 ENCOUNTER — Encounter: Payer: Self-pay | Admitting: Family

## 2020-05-27 DIAGNOSIS — K5901 Slow transit constipation: Secondary | ICD-10-CM

## 2020-05-27 DIAGNOSIS — Z952 Presence of prosthetic heart valve: Secondary | ICD-10-CM

## 2020-05-27 DIAGNOSIS — E781 Pure hyperglyceridemia: Secondary | ICD-10-CM | POA: Diagnosis not present

## 2020-05-27 DIAGNOSIS — M545 Low back pain: Secondary | ICD-10-CM | POA: Diagnosis not present

## 2020-05-27 DIAGNOSIS — G8929 Other chronic pain: Secondary | ICD-10-CM

## 2020-05-27 DIAGNOSIS — I10 Essential (primary) hypertension: Secondary | ICD-10-CM

## 2020-05-27 DIAGNOSIS — R1312 Dysphagia, oropharyngeal phase: Secondary | ICD-10-CM

## 2020-05-27 DIAGNOSIS — Z515 Encounter for palliative care: Secondary | ICD-10-CM

## 2020-05-27 DIAGNOSIS — L03221 Cellulitis of neck: Secondary | ICD-10-CM

## 2020-05-27 DIAGNOSIS — K219 Gastro-esophageal reflux disease without esophagitis: Secondary | ICD-10-CM

## 2020-05-27 DIAGNOSIS — Z951 Presence of aortocoronary bypass graft: Secondary | ICD-10-CM

## 2020-05-27 DIAGNOSIS — I2510 Atherosclerotic heart disease of native coronary artery without angina pectoris: Secondary | ICD-10-CM | POA: Diagnosis not present

## 2020-05-27 DIAGNOSIS — G3185 Corticobasal degeneration: Secondary | ICD-10-CM

## 2020-05-27 MED ORDER — TIZANIDINE HCL 4 MG PO TABS: 4.0000 mg | ORAL_TABLET | Freq: Two times a day (BID) | ORAL | 0 refills | Status: AC

## 2020-05-27 MED ORDER — MELOXICAM 7.5 MG PO TABS: 7.5000 mg | ORAL_TABLET | Freq: Two times a day (BID) | ORAL | 0 refills | Status: AC

## 2020-05-27 MED ORDER — DULOXETINE HCL 60 MG PO CPEP: 60.0000 mg | ORAL_CAPSULE | ORAL | 0 refills | Status: DC

## 2020-05-27 MED ORDER — BENAZEPRIL HCL 10 MG PO TABS: 10.0000 mg | ORAL_TABLET | Freq: Every evening | ORAL | 0 refills | Status: DC

## 2020-05-27 MED ORDER — GLUCOSAMINE-CHONDROITIN 500-400 MG PO TABS: 1.0000 | ORAL_TABLET | Freq: Two times a day (BID) | ORAL | 0 refills | Status: DC

## 2020-05-27 MED ORDER — HYDROCHLOROTHIAZIDE 12.5 MG PO TABS: 12.5000 mg | ORAL_TABLET | Freq: Every day | ORAL | 0 refills | Status: DC

## 2020-05-27 MED ORDER — HYDROCODONE-ACETAMINOPHEN 5-325 MG PO TABS: 1.0000 | ORAL_TABLET | Freq: Three times a day (TID) | ORAL | 0 refills | Status: DC | PRN

## 2020-05-27 MED ORDER — MIRABEGRON ER 25 MG PO TB24: 25.0000 mg | ORAL_TABLET | Freq: Every day | ORAL | 0 refills | Status: DC

## 2020-05-27 MED ORDER — ONDANSETRON 8 MG PO TBDP: 8.0000 mg | ORAL_TABLET | Freq: Three times a day (TID) | ORAL | 0 refills | Status: DC

## 2020-05-27 MED ORDER — BISACODYL 10 MG/30ML RE ENEM: 10.0000 mg | ENEMA | RECTAL | 0 refills | Status: AC | PRN

## 2020-05-27 MED ORDER — DULOXETINE HCL 30 MG PO CPEP: 30.0000 mg | ORAL_CAPSULE | Freq: Every evening | ORAL | 0 refills | Status: AC

## 2020-05-27 MED ORDER — COENZYME Q10 30 MG PO CAPS: 30.0000 mg | ORAL_CAPSULE | Freq: Every day | ORAL | 0 refills | Status: DC

## 2020-05-27 MED ORDER — GABAPENTIN 600 MG PO TABS: 600.0000 mg | ORAL_TABLET | Freq: Two times a day (BID) | ORAL | 0 refills | Status: DC

## 2020-05-27 MED ORDER — NYSTATIN 100000 UNIT/ML MT SUSP: 5.0000 mL | Freq: Four times a day (QID) | OROMUCOSAL | 0 refills | Status: DC

## 2020-05-27 MED ORDER — SENNOSIDES-DOCUSATE SODIUM 8.6-50 MG PO TABS: 1.0000 | ORAL_TABLET | Freq: Two times a day (BID) | ORAL | 0 refills | Status: AC

## 2020-05-27 MED ORDER — PANTOPRAZOLE SODIUM 40 MG PO TBEC: 40.0000 mg | DELAYED_RELEASE_TABLET | Freq: Every day | ORAL | 0 refills | Status: DC

## 2020-05-27 MED ORDER — SIMVASTATIN 20 MG PO TABS: ORAL_TABLET | ORAL | 0 refills | Status: DC

## 2020-05-27 MED ORDER — CARBIDOPA-LEVODOPA 25-100 MG PO TABS: 2.0000 | ORAL_TABLET | Freq: Three times a day (TID) | ORAL | 0 refills | Status: AC

## 2020-05-27 NOTE — Progress Notes (Signed)
Therapist, nutritional Palliative Care Consult Note Telephone: [phone]  Fax: [phone]  PATIENT NAME: Jeffery [name] DOB: 1955-12-20 MRN: [id number]  PRIMARY CARE PROVIDER:   Joycelyn Rua, MD  REFERRING PROVIDER: Dr. Bufford Spikes  RESPONSIBLE PARTY:   Self and wife  Zavion, Sleight Spouse [phone]  [phone]    RECOMMENDATIONS and PLAN:  Palliative Care Encounter  Z51.5  1.  Advance care planning:  Explanation of palliative and hospice care services.  Reviewed goals of care with include improving mobility to return to a more independent functional level.  Continue PT/OT after discharge to home on 05/28/20.  Advanced directives removed.  Previous selection of DNAR.  Unable to determine level of care today.  Other delegations are antibiotic and IV therapy if indicated, no tube feedings.  Patient and spouse will discussion level of care and notify this provider for completion of the MOST form.  DNR and MOST forms are to go home with patient.   2.  Depression:  Not well controlled. Consider addition of antidepressant such as Lexapro 10mg  daily. Declines suicidal ideation.  Consider home counselor sessions.  3.  Parkinson:  Continue PT/OT at home.  Aspiration and fall risk precautions reviewed.  Nutritional meal replacement prn if fatigue with eating solid foods.    I spent 60 minutes providing this consultation,  from 1400 to 1500.  More than 50% of the time in this consultation was spent coordinating communication with patient, wife and daughter.   HISTORY OF PRESENT ILLNESS:  Jeffery [name] is a 64 y.o. year old male with multiple medical problems including  CAD, Parkinson Disease.  He was admitted to hospital from 7/22-7/27 due to acute parotiditis with cellulitis.  Reports improvement of those admitting symptoms. He has difficulty with ambulation.  Palliative Care was asked to help address goals of care.   CODE STATUS: DNAR  PPS: 40% weak HOSPICE  ELIGIBILITY/DIAGNOSIS: TBD  PAST MEDICAL HISTORY:  Past Medical History:  Diagnosis Date  . Amaurosis fugax   . Amaurosis fugax 2012  . Aortic stenosis   . Bicuspid aortic valve   . Calculus of gallbladder   . Chest pain on exertion   . CHF (congestive heart failure) (HCC)   . Cholecystitis   . Coronary artery disease 08/01/2013   LAD stenosis  . Dysrhythmia   . Exertional shortness of breath   . Heart murmur   . Hypertension   . Hypertriglyceridemia   . Incidental pulmonary nodule, > 68mm and < 43mm 07/23/2013   5 mm L lung nodule and 4 mm R lung nodule seen incidentally on chest CT  . Osteoarthritis   . S/P aortic valve replacement with bioprosthetic valve 08/20/2013   25 mm [facility] Ease bovine pericardial tissue valve  . 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 Smoking status: Former Smoker    Quit date: 10/09/1992    Years since quitting: 27.6  . Smokeless tobacco: Former 12/07/1992  . Tobacco comment: chewed on cigars  Substance Use Topics  . Alcohol use: Yes    Alcohol/week: 1.0 standard drink    Types: 1 Cans of beer per week    Comment: 1 beer a month    ALLERGIES: No Known Allergies   PERTINENT MEDICATIONS:  Outpatient Encounter Medications as of 05/27/2020  Medication Sig  . acetaminophen (TYLENOL) 325 MG tablet Take 650 mg by mouth every 6 (  six) hours as needed.  Marland Kitchen aspirin 81 MG EC tablet Take 81 mg by mouth daily. Swallow whole. 1 tablet by mouth daily  . benazepril (LOTENSIN) 10 MG tablet Take 1 tablet (10 mg total) by mouth every evening.  . bisacodyl (FLEET) 10 MG/30ML ENEM Place 30 mLs (10 mg total) rectally as needed.  . carbidopa-levodopa (SINEMET IR) 25-100 MG tablet Take 2 tablets by mouth 3 (three) times daily.  . cetirizine (ZYRTEC) 10 MG tablet Take 10 mg by mouth daily.  Marland Kitchen co-enzyme Q-10 30 MG capsule Take 1 capsule (30 mg total) by mouth at bedtime.  . DHA-EPA-VITAMIN E PO Take 1 tablet by mouth  every evening.   . DULoxetine (CYMBALTA) 30 MG capsule Take 1 capsule (30 mg total) by mouth every evening.  . DULoxetine (CYMBALTA) 60 MG capsule Take 1 capsule (60 mg total) by mouth every morning.  . gabapentin (NEURONTIN) 600 MG tablet Take 1 tablet (600 mg total) by mouth 2 (two) times daily.  Marland Kitchen glucosamine-chondroitin 500-400 MG tablet Take 1 tablet by mouth 2 (two) times daily.  . hydrochlorothiazide (HYDRODIURIL) 12.5 MG tablet Take 1 tablet (12.5 mg total) by mouth daily.  Marland Kitchen HYDROcodone-acetaminophen (NORCO/VICODIN) 5-325 MG tablet Take 1 tablet by mouth every 8 (eight) hours as needed for moderate pain.  . Magnesium Hydroxide (MILK OF MAGNESIA PO) Take 30 mLs by mouth.  . melatonin 5 MG TABS Take 5 mg by mouth at bedtime.  . meloxicam (MOBIC) 7.5 MG tablet Take 1 tablet (7.5 mg total) by mouth 2 (two) times daily.  . mirabegron ER (MYRBETRIQ) 25 MG TB24 tablet Take 1 tablet (25 mg total) by mouth daily.  . Multiple Vitamins-Minerals (MENS MULTIVITAMIN PLUS PO) Take 1 tablet by mouth daily.  Marland Kitchen nystatin (MYCOSTATIN) 100000 UNIT/ML suspension Take 5 mLs (500,000 Units total) by mouth 4 (four) times daily.  . Omega-3 Fatty Acids (FISH OIL) 500 MG CAPS Take 1 capsule by mouth daily.   . ondansetron (ZOFRAN-ODT) 8 MG disintegrating tablet Take 1 tablet (8 mg total) by mouth with breakfast, with lunch, and with evening meal.  . pantoprazole (PROTONIX) 40 MG tablet Take 1 tablet (40 mg total) by mouth daily at 12 noon.  . senna-docusate (SENOKOT-S) 8.6-50 MG tablet Take 1 tablet by mouth 2 (two) times daily.  . simvastatin (ZOCOR) 20 MG tablet TAKE 1 TABLET BY MOUTH EVERY DAY AT 6PM  . Sodium Phosphates (RA SALINE ENEMA RE) Place 1 suppository rectally as needed.  Marland Kitchen Specialty Vitamins Products (PROSTATE PO) Take 1 tablet by mouth daily.  Marland Kitchen tiZANidine (ZANAFLEX) 4 MG tablet Take 1 tablet (4 mg total) by mouth 2 (two) times daily.   No facility-administered encounter medications on file as of  05/27/2020.    PHYSICAL EXAM:   General: NAD, chronically ill male sitting in recliner EENT:  Mild edema and tenderness of lateral R mandibular region.  Cardiovascular: regular rate and rhythm Pulmonary: clear ant fields Abdomen: soft, nontender, + bowel sounds Extremities: no edema Skin: exposed skin is intact Neurological: A&O x3.  Weakness , tremors of both hands Psych:  Intermittently sobs related to degree of chronic illness and loss of independence  Margaretha Sheffield, NP-C

## 2020-05-27 NOTE — Progress Notes (Signed)
Met with patient and his wife at [facility]. Patient was out of bed to chair, finishing his lunch. Patient was able to feed self with minimal assistance. Continues on a mechanical soft diet without any obvious difficulty. Observed patient participating in rehab, able to follow directions and actively participating.   Per patient's wife, plan is for patient to return home after completion of rehab. Wife is requesting to speak to SW for resources of caregivers in the home.

## 2020-05-27 NOTE — Progress Notes (Signed)
[facility] Room Number: 109-P Place of Service:  SNF [id number])  Provider: Richarda Blade FNP-C   PCP: Jeffery Rua, MD Patient Care Team: Jeffery Rua, MD as PCP - General [facility] Medicine)  Extended Emergency Contact Information Primary Emergency Contact: [name],Jeffery Address: [address]          [place], [place] [address] D[place] of [place] Home Phone: [phone] Mobile Phone: [phone] Relation: Spouse Secondary Emergency Contact: Jeffery [name], [place] [address] [place] of [place] Home Phone: [phone] Mobile Phone: [phone] Relation: Daughter  Code Status: DNR Goals of care:  Advanced Directive information Advanced Directives 05/27/2020  Does Patient Have a Medical Advance Directive? Yes  Type of Advance Directive Out of facility DNR (pink MOST or yellow form);Living will;Healthcare Power of Attorney  Does patient want to make changes to medical advance directive? No - Patient declined  Copy of Healthcare Power of Attorney in Chart? No - copy requested  Pre-existing out of facility DNR order (yellow form or pink MOST form) -     No Known Allergies  Chief Complaint  Patient presents with   Discharge Note    Discharge home 05/28/2020    HPI:  64 y.o. male seen today at [facility] and rehabilitation for discharge home.He has a medical history of Hypertension,Hyperlipidemia,CAD s/p CABG x 1 LIMA to LAD,cortical basal parkinsonian syndrome. He was here for short term rehabilitation for post hospital admission at [facility] from 04/28/2020 - 05/04/2020 for facial swelling and Parotitis.CT of the neck showed acute parotitis but no obstructive stone.ENT was consulted and he was treated with I.V Unasyn,heating pad and Motrin.He was later switched to Augmentin which he completed during Rehab.He also had dysphagia  Was seen by speech therapy who recommended dysphagia 2 diet.SNF was  recommended.He has had unremarkable stay here at Rehab.    He has worked well with PT/OT now stable for discharge home.He will be discharged home with Home health PT/OT  to continue with ROM, Exercise, Gait stability and muscle strengthening.He will also discharge with Home health Speech Therapy for dysphagia.He will  require  DME standard WC, extended brake handles, removable elevating leg rests  to enable her to maintain current level of independence with ADL's which cannot be achieved with walker or cane.He will also need a Home health Nurse Aide to assist with his activity of daily Living.Home health services will be arranged by facility social worker prior to discharge. Prescription medication will be written x 1 month then patient to follow up with PCP in 1-2 weeks.He denies any acute issues this visit. Facility staff report no new concerns.   Past Medical History:  Diagnosis Date   Amaurosis fugax    Amaurosis fugax 2012   Aortic stenosis    Bicuspid aortic valve    Calculus of gallbladder    Chest pain on exertion    CHF (congestive heart failure) (HCC)    Cholecystitis    Coronary artery disease 08/01/2013   LAD stenosis   Dysrhythmia    Exertional shortness of breath    Heart murmur    Hypertension    Hypertriglyceridemia    Incidental pulmonary nodule, > 37mm and < 72mm 07/23/2013   5 mm L lung nodule and 4 mm R lung nodule seen incidentally on chest CT   Osteoarthritis    S/P aortic valve replacement with bioprosthetic valve 08/20/2013   25 mm [employer]  Ease bovine pericardial tissue valve   S/P CABG x 1 08/20/2013   LIMA to LAD    Past Surgical History:  Procedure Laterality Date   AORTIC VALVE REPLACEMENT N/A 08/20/2013   Procedure: AORTIC VALVE REPLACEMENT (AVR);  Surgeon: Jeffery Nails, MD;  Location: [facility] OR;  Service: Open Heart Surgery;  Laterality: N/A;   CHOLECYSTECTOMY     CORONARY ARTERY BYPASS GRAFT N/A 08/20/2013   Procedure:  CORONARY ARTERY BYPASS GRAFTING (CABG);  Surgeon: Jeffery Nails, MD;  Location: [facility] OR;  Service: Open Heart Surgery;  Laterality: N/A;  CABG x one, using left internal mammary artery   INTRAOPERATIVE TRANSESOPHAGEAL ECHOCARDIOGRAM N/A 08/20/2013   Procedure: INTRAOPERATIVE TRANSESOPHAGEAL ECHOCARDIOGRAM;  Surgeon: Jeffery Nails, MD;  Location: [facility] OR;  Service: Open Heart Surgery;  Laterality: N/A;   LEFT AND RIGHT HEART CATHETERIZATION WITH CORONARY ANGIOGRAM N/A 08/01/2013   Procedure: LEFT AND RIGHT HEART CATHETERIZATION WITH CORONARY ANGIOGRAM;  Surgeon: Donato Schultz, MD;  Location: [facility] CATH LAB;  Service: Cardiovascular;  Laterality: N/A;   NOSE SURGERY     umbillical hernia        reports that he quit smoking about 27 years ago. He has quit using smokeless tobacco. He reports current alcohol use of about 1.0 standard drink of alcohol per week. He reports that he does not use drugs. Social History   Socioeconomic History   Marital status: Married    Spouse name: Not on file   Number of children: Not on file   Years of education: Not on file   Highest education level: Not on file  Occupational History   Not on file  Tobacco Use   Smoking status: Former Smoker    Quit date: 10/09/1992    Years since quitting: 27.6   Smokeless tobacco: Former Neurosurgeon   Tobacco comment: chewed on cigars  Vaping Use   Vaping Use: Never used  Substance and Sexual Activity   Alcohol use: Yes    Alcohol/week: 1.0 standard drink    Types: 1 Cans of beer per week    Comment: 1 beer a month   Drug use: No   Sexual activity: Not on file  Other Topics Concern   Not on file  Social History Narrative   Lives with wife and daughter in a one story home.  Has 2 daughters.     Works as an Personnel officer.  Education: high school.   Social Determinants of Health   Financial Resource Strain:    Difficulty of Paying Living Expenses: Not on file  Food Insecurity:    Worried About Brewing technologist in the Last Year: Not on file   [employer] of Food in the Last Year: Not on file  Transportation Needs:    Lack of Transportation (Medical): Not on file   Lack of Transportation (Non-Medical): Not on file  Physical Activity:    Days of Exercise per Week: Not on file   Minutes of Exercise per Session: Not on file  Stress:    Feeling of Stress : Not on file  Social Connections:    Frequency of Communication with Friends and Family: Not on file   Frequency of Social Gatherings with Friends and Family: Not on file   Attends Religious Services: Not on file   Active Member of Clubs or Organizations: Not on file   Attends Banker Meetings: Not on file   Marital Status: Not on file  Intimate Partner Violence:  Fear of Current or Ex-Partner: Not on file   Emotionally Abused: Not on file   Physically Abused: Not on file   Sexually Abused: Not on file   No Known Allergies  Pertinent  Health Maintenance Due  Topic Date Due   COLONOSCOPY  Never done   INFLUENZA VACCINE  05/09/2020    Medications: Outpatient Encounter Medications as of 05/27/2020  Medication Sig   acetaminophen (TYLENOL) 325 MG tablet Take 650 mg by mouth every 6 (six) hours as needed.   aspirin 81 MG EC tablet Take 81 mg by mouth daily. Swallow whole. 1 tablet by mouth daily   benazepril (LOTENSIN) 10 MG tablet Take 1 tablet (10 mg total) by mouth every evening.   bisacodyl (FLEET) 10 MG/30ML ENEM Place 30 mLs (10 mg total) rectally as needed.   carbidopa-levodopa (SINEMET IR) 25-100 MG tablet Take 2 tablets by mouth 3 (three) times daily.   cetirizine (ZYRTEC) 10 MG tablet Take 10 mg by mouth daily.   co-enzyme Q-10 30 MG capsule Take 1 capsule (30 mg total) by mouth at bedtime.   DHA-EPA-VITAMIN E PO Take 1 tablet by mouth every evening.    DULoxetine (CYMBALTA) 30 MG capsule Take 1 capsule (30 mg total) by mouth every evening.   DULoxetine (CYMBALTA) 60 MG capsule Take 1  capsule (60 mg total) by mouth every morning.   gabapentin (NEURONTIN) 600 MG tablet Take 1 tablet (600 mg total) by mouth 2 (two) times daily.   glucosamine-chondroitin 500-400 MG tablet Take 1 tablet by mouth 2 (two) times daily.   hydrochlorothiazide (HYDRODIURIL) 12.5 MG tablet Take 1 tablet (12.5 mg total) by mouth daily.   HYDROcodone-acetaminophen (NORCO/VICODIN) 5-325 MG tablet Take 1 tablet by mouth every 8 (eight) hours as needed for moderate pain.   Magnesium Hydroxide (MILK OF MAGNESIA PO) Take 30 mLs by mouth.   melatonin 5 MG TABS Take 5 mg by mouth at bedtime.   meloxicam (MOBIC) 7.5 MG tablet Take 1 tablet (7.5 mg total) by mouth 2 (two) times daily.   mirabegron ER (MYRBETRIQ) 25 MG TB24 tablet Take 1 tablet (25 mg total) by mouth daily.   Multiple Vitamins-Minerals (MENS MULTIVITAMIN PLUS PO) Take 1 tablet by mouth daily.   nystatin (MYCOSTATIN) 100000 UNIT/ML suspension Take 5 mLs (500,000 Units total) by mouth 4 (four) times daily.   Omega-3 Fatty Acids (FISH OIL) 500 MG CAPS Take 1 capsule by mouth daily.    ondansetron (ZOFRAN-ODT) 8 MG disintegrating tablet Take 1 tablet (8 mg total) by mouth with breakfast, with lunch, and with evening meal.   pantoprazole (PROTONIX) 40 MG tablet Take 1 tablet (40 mg total) by mouth daily at 12 noon.   senna-docusate (SENOKOT-S) 8.6-50 MG tablet Take 1 tablet by mouth 2 (two) times daily.   simvastatin (ZOCOR) 20 MG tablet TAKE 1 TABLET BY MOUTH EVERY DAY AT 6PM   Sodium Phosphates (RA SALINE ENEMA RE) Place 1 suppository rectally as needed.   Specialty Vitamins Products (PROSTATE PO) Take 1 tablet by mouth daily.   tiZANidine (ZANAFLEX) 4 MG tablet Take 1 tablet (4 mg total) by mouth 2 (two) times daily.   [DISCONTINUED] benazepril (LOTENSIN) 10 MG tablet Take 10 mg by mouth every evening.    [DISCONTINUED] bisacodyl (FLEET) 10 MG/30ML ENEM Place 10 mg rectally as needed.   [DISCONTINUED] carbidopa-levodopa (SINEMET  IR) 25-100 MG tablet Take 2 tablets by mouth 3 (three) times daily.   [DISCONTINUED] co-enzyme Q-10 30 MG capsule Take 30 mg  by mouth at bedtime.   [DISCONTINUED] DULoxetine (CYMBALTA) 30 MG capsule Take 1 capsule by mouth every evening.    [DISCONTINUED] DULoxetine (CYMBALTA) 60 MG capsule Take 60 mg by mouth every morning.    [DISCONTINUED] gabapentin (NEURONTIN) 600 MG tablet Take 600 mg by mouth 2 (two) times daily.   [DISCONTINUED] glucosamine-chondroitin 500-400 MG tablet Take 1 tablet by mouth 2 (two) times daily.   [DISCONTINUED] hydrochlorothiazide (HYDRODIURIL) 12.5 MG tablet Take 12.5 mg by mouth daily.   [DISCONTINUED] HYDROcodone-acetaminophen (NORCO/VICODIN) 5-325 MG tablet Take 1 tablet by mouth at bedtime.   [DISCONTINUED] meloxicam (MOBIC) 7.5 MG tablet Take 1 tablet by mouth 2 (two) times daily.   [DISCONTINUED] mirabegron ER (MYRBETRIQ) 25 MG TB24 tablet Take 25 mg by mouth daily.   [DISCONTINUED] nystatin (MYCOSTATIN) 100000 UNIT/ML suspension Take 5 mLs (500,000 Units total) by mouth 4 (four) times daily.   [DISCONTINUED] ondansetron (ZOFRAN-ODT) 8 MG disintegrating tablet Take 1 tablet (8 mg total) by mouth with breakfast, with lunch, and with evening meal.   [DISCONTINUED] pantoprazole (PROTONIX) 40 MG tablet Take 1 tablet (40 mg total) by mouth daily at 12 noon.   [DISCONTINUED] senna-docusate (SENOKOT-S) 8.6-50 MG tablet Take 1 tablet by mouth 2 (two) times daily.   [DISCONTINUED] simvastatin (ZOCOR) 20 MG tablet TAKE 1 TABLET BY MOUTH EVERY DAY AT 6PM   [DISCONTINUED] tiZANidine (ZANAFLEX) 4 MG tablet Take 4 mg by mouth 2 (two) times daily.   [DISCONTINUED] docusate sodium (COLACE) 100 MG capsule Take 200 mg by mouth at bedtime.   [DISCONTINUED] gabapentin (NEURONTIN) 300 MG capsule Take 600 mg by mouth 2 (two) times daily.    [DISCONTINUED] saccharomyces boulardii (FLORASTOR) 250 MG capsule Take 1 capsule (250 mg total) by mouth 2 (two) times daily.    No facility-administered encounter medications on file as of 05/27/2020.     Review of Systems  Constitutional: Negative for appetite change, chills, fatigue and fever.  HENT: Positive for hearing loss. Negative for congestion, rhinorrhea, sinus pressure, sinus pain, sneezing and sore throat.        Dysphagia   Eyes: Negative for pain, discharge, redness and itching.       Wears eye glasses   Respiratory: Negative for cough, chest tightness, shortness of breath and wheezing.   Cardiovascular: Negative for chest pain, palpitations and leg swelling.  Gastrointestinal: Negative for abdominal distention, abdominal pain, constipation, diarrhea, nausea and vomiting.  Endocrine: Negative for cold intolerance, heat intolerance, polydipsia, polyphagia and polyuria.  Genitourinary: Negative for difficulty urinating, dysuria, flank pain, frequency and urgency.  Musculoskeletal: Positive for arthralgias and back pain. Negative for joint swelling, myalgias, neck pain and neck stiffness.       Requires a wheelchair   Skin: Negative for color change, pallor, rash and wound.  Neurological: Positive for weakness. Negative for dizziness, speech difficulty, light-headedness and headaches.  Hematological: Does not bruise/bleed easily.  Psychiatric/Behavioral: Negative for agitation and sleep disturbance. The patient is not nervous/anxious.     Vitals:   05/27/20 1645  BP: 132/71  Pulse: 70  Resp: 18  Temp: (!) 97.2 F (36.2 C)  Weight: 198 lb (89.8 kg)  Height: 5\' 10"  (1.778 m)   Body mass index is 28.41 kg/m.  Physical Exam Vitals and nursing note reviewed.  Constitutional:      General: He is not in acute distress.    Appearance: He is not ill-appearing.  HENT:     Head: Normocephalic.     Nose: Nose normal. No congestion or  rhinorrhea.     Mouth/Throat:     Mouth: Mucous membranes are moist.     Pharynx: Oropharynx is clear. No oropharyngeal exudate or posterior oropharyngeal erythema.   Eyes:     General: No scleral icterus.       Right eye: No discharge.        Left eye: No discharge.     Conjunctiva/sclera: Conjunctivae normal.     Pupils: Pupils are equal, round, and reactive to light.  Cardiovascular:     Rate and Rhythm: Normal rate and regular rhythm.     Pulses: Normal pulses.     Heart sounds: Normal heart sounds. No murmur heard.  No friction rub. No gallop.   Pulmonary:     Effort: Pulmonary effort is normal. No respiratory distress.     Breath sounds: Normal breath sounds. No wheezing, rhonchi or rales.  Chest:     Chest wall: No tenderness.  Abdominal:     General: Bowel sounds are normal. There is no distension.     Palpations: Abdomen is soft. There is no mass.     Tenderness: There is no abdominal tenderness. There is no right CVA tenderness, left CVA tenderness, guarding or rebound.  Musculoskeletal:        General: No swelling or tenderness.     Cervical back: Normal range of motion. No rigidity or tenderness.     Right lower leg: No edema.     Left lower leg: No edema.     Comments: Unsteady.   Lymphadenopathy:     Cervical: No cervical adenopathy.  Skin:    General: Skin is warm.     Coloration: Skin is not pale.     Findings: No bruising, erythema or rash.  Neurological:     Mental Status: He is alert. Mental status is at baseline.     Motor: Weakness present.     Comments: HOH   Psychiatric:        Mood and Affect: Mood normal.        Behavior: Behavior normal.        Thought Content: Thought content normal.        Judgment: Judgment normal.     Labs reviewed: Basic Metabolic Panel: Recent Labs    04/29/20 0308 04/29/20 1356 04/30/20 0258 04/30/20 0258 05/01/20 0332 05/02/20 0338 05/11/20 0000  NA 137  --  138   < > 138 140 139  K 2.8*   < > 3.3*   < > 3.8 3.9 4.2  CL 103  --  103   < > 104 103 99  CO2 26  --  24   < > 27 28 24*  GLUCOSE 97  --  84  --  99 90  --   BUN 18  --  15   < > CREATININE 0.89  --   0.77   < > 0.83 0.93 0.9  CALCIUM 8.4*  --  8.6*   < > 8.6* 9.1 9.7  MG 1.8  --  1.9  --   --   --   --    < > = values in this interval not displayed.   Liver Function Tests: Recent Labs    04/28/20 1832  AST 23  ALT 15  ALKPHOS 50  BILITOT 1.1  PROT 7.2  ALBUMIN 3.8   CBC: Recent Labs    04/28/20 1832 04/28/20 1832 04/29/20 0308 04/29/20 0308 04/30/20 0258 04/30/20 0258 05/01/20  7672 05/02/20 0338 05/11/20 0000  WBC 11.4*   < > 8.3   < > 5.1   < > 5.1 5.8 7.3  NEUTROABS 9.0*  --  5.8  --   --   --   --   --  5  HGB 14.6   < > 12.4*   < > 12.8*   < > 12.6* 13.6 15.6  HCT 43.6   < > 37.3*   < > 37.1*   < > 38.1* 41.2 45  MCV 91.8   < > 92.8   < > 90.7  --  91.8 92.0  --   PLT 161   < > 143*   < > 155   < > 194 226 281   < > = values in this interval not displayed.    Procedures and Imaging Studies During Stay: No results found.  Assessment/Plan:     1. Chronic midline low back pain without sciatica Chronic.Pain under control.continue on Cymbalta,Norco ,Mobic and Tizanidine. - DULoxetine (CYMBALTA) 30 MG capsule; Take 1 capsule (30 mg total) by mouth every evening.  Dispense: 30 capsule; Refill: 0 - DULoxetine (CYMBALTA) 60 MG capsule; Take 1 capsule (60 mg total) by mouth every morning.  Dispense: 30 capsule; Refill: 0 - gabapentin (NEURONTIN) 600 MG tablet; Take 1 tablet (600 mg total) by mouth 2 (two) times daily.  Dispense: 60 tablet; Refill: 0 - HYDROcodone-acetaminophen (NORCO/VICODIN) 5-325 MG tablet; Take 1 tablet by mouth every 8 (eight) hours as needed for moderate pain.  Dispense: 30 tablet; Refill: 0 - meloxicam (MOBIC) 7.5 MG tablet; Take 1 tablet (7.5 mg total) by mouth 2 (two) times daily.  Dispense: 60 tablet; Refill: 0 - mirabegron ER (MYRBETRIQ) 25 MG TB24 tablet; Take 1 tablet (25 mg total) by mouth daily.  Dispense: 30 tablet; Refill: 0 - tiZANidine (ZANAFLEX) 4 MG tablet; Take 1 tablet (4 mg total) by mouth 2 (two) times daily.  Dispense: 60  tablet; Refill: 0  2. Essential hypertension B/p under control.continue on Hydrochlorothiazide and Benazepril.  - benazepril (LOTENSIN) 10 MG tablet; Take 1 tablet (10 mg total) by mouth every evening.  Dispense: 30 tablet; Refill: 0 - hydrochlorothiazide (HYDRODIURIL) 12.5 MG tablet; Take 1 tablet (12.5 mg total) by mouth daily.  Dispense: 30 tablet; Refill: 0  3. Hypertriglyceridemia No latest lipid panel for review.will defer to PCP to check labs.continue on simvastatin. - simvastatin (ZOCOR) 20 MG tablet; TAKE 1 TABLET BY MOUTH EVERY DAY AT 6PM  Dispense: 90 tablet; Refill: 0  4. Coronary artery disease involving native coronary artery of native heart without angina pectoris No chest pain reported.continue to control B/p.  - benazepril (LOTENSIN) 10 MG tablet; Take 1 tablet (10 mg total) by mouth every evening.  Dispense: 30 tablet; Refill: 0 - hydrochlorothiazide (HYDRODIURIL) 12.5 MG tablet; Take 1 tablet (12.5 mg total) by mouth daily.  Dispense: 30 tablet; Refill: 0 - simvastatin (ZOCOR) 20 MG tablet; TAKE 1 TABLET BY MOUTH EVERY DAY AT 6PM  Dispense: 90 tablet; Refill: 0  5. Corticobasal syndrome (HCC) Chronic.continue to follow up with Neurologist - Standard wheelchair ordered.Was using wheelchair and walking short distance prior to admission.  - Home health PT/OT  - Home health Nurse Aide to assist with Activity of daily living.  - carbidopa-levodopa (SINEMET IR) 25-100 MG tablet; Take 2 tablets by mouth 3 (three) times daily.  Dispense: 90 tablet; Refill: 0 - DULoxetine (CYMBALTA) 30 MG capsule; Take 1 capsule (30 mg total) by mouth every  evening.  Dispense: 30 capsule; Refill: 0 - DULoxetine (CYMBALTA) 60 MG capsule; Take 1 capsule (60 mg total) by mouth every morning.  Dispense: 30 capsule; Refill: 0 - gabapentin (NEURONTIN) 600 MG tablet; Take 1 tablet (600 mg total) by mouth 2 (two) times daily.  Dispense: 60 tablet; Refill: 0  6. Slow transit constipation Current regimen  effective.continue to encourage oral intake and hydration.  - bisacodyl (FLEET) 10 MG/30ML ENEM; Place 30 mLs (10 mg total) rectally as needed.  Dispense: 37 mL; Refill: 0 - senna-docusate (SENOKOT-S) 8.6-50 MG tablet; Take 1 tablet by mouth 2 (two) times daily.  Dispense: 60 tablet; Refill: 0  7. S/P AVR (aortic valve replacement) - stable. - continue to follow up with Cardiologist  - benazepril (LOTENSIN) 10 MG tablet; Take 1 tablet (10 mg total) by mouth every evening.  Dispense: 30 tablet; Refill: 0 - hydrochlorothiazide (HYDRODIURIL) 12.5 MG tablet; Take 1 tablet (12.5 mg total) by mouth daily.  Dispense: 30 tablet; Refill: 0 - simvastatin (ZOCOR) 20 MG tablet; TAKE 1 TABLET BY MOUTH EVERY DAY AT 6PM  Dispense: 90 tablet; Refill: 0  8. S/P CABG x 1 No issues reported since surgery. Continue to monitor.   9. Gastroesophageal reflux disease without esophagitis Asymptomatic.continue on Protonix. - pantoprazole (PROTONIX) 40 MG tablet; Take 1 tablet (40 mg total) by mouth daily at 12 noon.  Dispense: 30 tablet; Refill: 0   10. Unsteady gait Has worked well with PT/ OT.He will discharge home with  PT/OT to continue with ROM, Exercise, Gait stability and muscle strengthening.He will require  DME standard wheelchair to enable her to maintain current level of independence with ADL's which cannot be achieved with walker or cane.     11. Oropharyngeal dysphagia Will discharge home with Home health Speech Therapy. - continue on dysphagia 2 diet with thin liquids.  12. Cellulitis of the neck  Afebrile. Resolved.Status post hospital admission as above.completed I.V antibiotics.He was discharge with 8 more days of Augmentin that he completed during his stay at Rehab with much improvement.   - CBC, BMP in 1-2 weeks PCP   Patient is being discharged with the following home health services:   -PT/OT for ROM, exercise, gait stability and muscle strengthening - ST for dysphagia   - HH Aid for  ADL's assist   Patient is being discharged with the following durable medical equipment:   -  standard WC with removable elevating leg rests  to enable her to maintain current level of independence with ADL's which cannot be achieved with walker or cane.    Patient has been advised to f/u with their PCP in 1-2 weeks to for a transitions of care visit.Social services at their facility was responsible for arranging this appointment.  Pt was provided with adequate prescriptions of noncontrolled medications to reach the scheduled appointment.For controlled substances, a limited supply was provided as appropriate for the individual patient. If the pt normally receives these medications from a pain clinic or has a contract with another physician, these medications should be received from that clinic or physician only).    Future labs/tests needed:  CBC, BMP in 1-2 weeks PCP

## 2020-05-28 ENCOUNTER — Other Ambulatory Visit: Payer: Self-pay

## 2020-06-10 ENCOUNTER — Other Ambulatory Visit: Payer: Self-pay | Admitting: Family

## 2020-06-10 DIAGNOSIS — I10 Essential (primary) hypertension: Secondary | ICD-10-CM

## 2020-06-10 DIAGNOSIS — G3185 Corticobasal degeneration: Secondary | ICD-10-CM

## 2020-06-10 DIAGNOSIS — I2510 Atherosclerotic heart disease of native coronary artery without angina pectoris: Secondary | ICD-10-CM

## 2020-06-10 DIAGNOSIS — Z952 Presence of prosthetic heart valve: Secondary | ICD-10-CM

## 2020-06-10 DIAGNOSIS — K219 Gastro-esophageal reflux disease without esophagitis: Secondary | ICD-10-CM

## 2020-06-28 ENCOUNTER — Other Ambulatory Visit: Payer: Self-pay | Admitting: Family

## 2020-06-28 DIAGNOSIS — M545 Low back pain, unspecified: Secondary | ICD-10-CM

## 2020-06-28 DIAGNOSIS — G8929 Other chronic pain: Secondary | ICD-10-CM

## 2020-12-07 DIAGNOSIS — R471 Dysarthria and anarthria: Secondary | ICD-10-CM | POA: Diagnosis not present

## 2020-12-07 DIAGNOSIS — Z993 Dependence on wheelchair: Secondary | ICD-10-CM | POA: Diagnosis not present

## 2020-12-07 DIAGNOSIS — R1319 Other dysphagia: Secondary | ICD-10-CM | POA: Diagnosis not present

## 2020-12-07 DIAGNOSIS — G20 Parkinson's disease: Secondary | ICD-10-CM | POA: Diagnosis not present

## 2020-12-07 DIAGNOSIS — G232 Striatonigral degeneration: Secondary | ICD-10-CM | POA: Diagnosis not present

## 2021-01-28 DIAGNOSIS — R350 Frequency of micturition: Secondary | ICD-10-CM | POA: Diagnosis not present

## 2021-02-04 DIAGNOSIS — M5442 Lumbago with sciatica, left side: Secondary | ICD-10-CM | POA: Diagnosis not present

## 2021-02-04 DIAGNOSIS — G20 Parkinson's disease: Secondary | ICD-10-CM | POA: Diagnosis not present

## 2022-03-12 ENCOUNTER — Emergency Department (HOSPITAL_COMMUNITY)
Admission: EM | Admit: 2022-03-12 | Discharge: 2022-03-13 | Disposition: A | Discharge status: Home / Self Care | Attending: Emergency Medicine | Admitting: Emergency Medicine

## 2022-03-12 ENCOUNTER — Other Ambulatory Visit: Payer: Self-pay

## 2022-03-12 ENCOUNTER — Encounter (HOSPITAL_COMMUNITY): Payer: Self-pay

## 2022-03-12 DIAGNOSIS — I517 Cardiomegaly: Secondary | ICD-10-CM | POA: Diagnosis not present

## 2022-03-12 DIAGNOSIS — I110 Hypertensive heart disease with heart failure: Secondary | ICD-10-CM | POA: Diagnosis not present

## 2022-03-12 DIAGNOSIS — Z7982 Long term (current) use of aspirin: Secondary | ICD-10-CM | POA: Diagnosis not present

## 2022-03-12 DIAGNOSIS — Z79899 Other long term (current) drug therapy: Secondary | ICD-10-CM | POA: Insufficient documentation

## 2022-03-12 DIAGNOSIS — J811 Chronic pulmonary edema: Secondary | ICD-10-CM | POA: Diagnosis not present

## 2022-03-12 DIAGNOSIS — K1121 Acute sialoadenitis: Secondary | ICD-10-CM | POA: Diagnosis not present

## 2022-03-12 DIAGNOSIS — I442 Atrioventricular block, complete: Secondary | ICD-10-CM | POA: Insufficient documentation

## 2022-03-12 DIAGNOSIS — I10 Essential (primary) hypertension: Secondary | ICD-10-CM | POA: Diagnosis not present

## 2022-03-12 DIAGNOSIS — R059 Cough, unspecified: Secondary | ICD-10-CM | POA: Diagnosis not present

## 2022-03-12 DIAGNOSIS — R0602 Shortness of breath: Secondary | ICD-10-CM | POA: Diagnosis present

## 2022-03-12 DIAGNOSIS — I2510 Atherosclerotic heart disease of native coronary artery without angina pectoris: Secondary | ICD-10-CM | POA: Diagnosis not present

## 2022-03-12 DIAGNOSIS — R0989 Other specified symptoms and signs involving the circulatory and respiratory systems: Secondary | ICD-10-CM | POA: Diagnosis not present

## 2022-03-12 DIAGNOSIS — I509 Heart failure, unspecified: Secondary | ICD-10-CM | POA: Diagnosis not present

## 2022-03-12 DIAGNOSIS — K1120 Sialoadenitis, unspecified: Secondary | ICD-10-CM | POA: Diagnosis not present

## 2022-03-12 NOTE — ED Provider Notes (Signed)
[facility]-EMERGENCY DEPT Provider Note   CSN: [id number] Arrival date & time: 03/12/22  2324     History  Chief Complaint  Patient presents with   Jaw Infection    Jeffery [name] is a 66 y.o. male.  The history is provided by the spouse and the patient.  He has history of hypertension, hyperlipidemia, coronary artery disease, aortic valve replacement, heart failure and currently in hospice and is DNR.  He started having some difficulty breathing and wheezing this afternoon and wife noted that heart rate was very slow.  Normal heart rate had been 48-50 prior to today.  He eventually was convinced to wear oxygen and he seems better since then.  There has been no vomiting and no diarrhea and no known fever.  He has been on amoxicillin-clavulanic acid for the last 3 days to treat a right parotid gland infection.  That infection seems to be improving.   Home Medications Prior to Admission medications   Medication Sig Start Date End Date Taking? Authorizing Provider  acetaminophen (TYLENOL) 325 MG tablet Take 650 mg by mouth every 6 (six) hours as needed.    [provider]  aspirin 81 MG EC tablet Take 81 mg by mouth daily. Swallow whole. 1 tablet by mouth daily    [provider]  benazepril (LOTENSIN) 10 MG tablet Take 1 tablet (10 mg total) by mouth every evening. 05/27/20   Ngetich, Dinah C, NP  bisacodyl (FLEET) 10 MG/30ML ENEM Place 30 mLs (10 mg total) rectally as needed. 05/27/20   Ngetich, Dinah C, NP  carbidopa-levodopa (SINEMET IR) 25-100 MG tablet Take 2 tablets by mouth 3 (three) times daily. 05/27/20   Ngetich, Dinah C, NP  cetirizine (ZYRTEC) 10 MG tablet Take 10 mg by mouth daily.    [provider]  co-enzyme Q-10 30 MG capsule Take 1 capsule (30 mg total) by mouth at bedtime. 05/27/20   Ngetich, Dinah C, NP  DHA-EPA-VITAMIN E PO Take 1 tablet by mouth every evening.     [provider]  DULoxetine (CYMBALTA) 30 MG  capsule Take 1 capsule (30 mg total) by mouth every evening. 05/27/20   Ngetich, Dinah C, NP  DULoxetine (CYMBALTA) 60 MG capsule Take 1 capsule (60 mg total) by mouth every morning. 05/27/20   Ngetich, Dinah C, NP  gabapentin (NEURONTIN) 600 MG tablet Take 1 tablet (600 mg total) by mouth 2 (two) times daily. 05/27/20   Ngetich, Dinah C, NP  glucosamine-chondroitin 500-400 MG tablet Take 1 tablet by mouth 2 (two) times daily. 05/27/20   Ngetich, Dinah C, NP  hydrochlorothiazide (HYDRODIURIL) 12.5 MG tablet Take 1 tablet (12.5 mg total) by mouth daily. 05/27/20   Ngetich, Dinah C, NP  HYDROcodone-acetaminophen (NORCO/VICODIN) 5-325 MG tablet Take 1 tablet by mouth every 8 (eight) hours as needed for moderate pain. 05/27/20   Ngetich, Dinah C, NP  Magnesium Hydroxide (MILK OF MAGNESIA PO) Take 30 mLs by mouth.    [provider]  melatonin 5 MG TABS Take 5 mg by mouth at bedtime.    [provider]  meloxicam (MOBIC) 7.5 MG tablet Take 1 tablet (7.5 mg total) by mouth 2 (two) times daily. 05/27/20   Ngetich, Dinah C, NP  mirabegron ER (MYRBETRIQ) 25 MG TB24 tablet Take 1 tablet (25 mg total) by mouth daily. 05/27/20   Ngetich, Dinah C, NP  Multiple Vitamins-Minerals (MENS MULTIVITAMIN PLUS PO) Take 1 tablet by mouth daily.    [provider]  nystatin (MYCOSTATIN) 100000 UNIT/ML suspension Take 5 mLs (500,000 Units total) by mouth 4 (four) times daily. 05/27/20   Ngetich, Dinah C, NP  Omega-3 Fatty Acids (FISH OIL) 500 MG CAPS Take 1 capsule by mouth daily.     [provider]  ondansetron (ZOFRAN-ODT) 8 MG disintegrating tablet Take 1 tablet (8 mg total) by mouth with breakfast, with lunch, and with evening meal. 05/27/20   Ngetich, Dinah C, NP  pantoprazole (PROTONIX) 40 MG tablet Take 1 tablet (40 mg total) by mouth daily at 12 noon. 05/27/20   Ngetich, Dinah C, NP  senna-docusate (SENOKOT-S) 8.6-50 MG tablet Take 1 tablet by mouth 2 (two) times daily. 05/27/20   Ngetich,  Dinah C, NP  simvastatin (ZOCOR) 20 MG tablet TAKE 1 TABLET BY MOUTH EVERY DAY AT 6PM 05/27/20   Ngetich, Dinah C, NP  Sodium Phosphates (RA SALINE ENEMA RE) Place 1 suppository rectally as needed.    [provider]  Specialty Vitamins Products (PROSTATE PO) Take 1 tablet by mouth daily.    [provider]  tiZANidine (ZANAFLEX) 4 MG tablet Take 1 tablet (4 mg total) by mouth 2 (two) times daily. 05/27/20   Ngetich, Donalee Citrin, NP      Allergies    Patient has no known allergies.    Review of Systems   Review of Systems  All other systems reviewed and are negative.  Physical Exam Updated Vital Signs BP 131/80 (BP Location: Right Arm)   Pulse 78   Temp 98.3 F (36.8 C) (Oral)   Resp 12   Ht 5\' 10"  (1.778 m)   SpO2 98%   BMI 28.41 kg/m  Physical Exam Vitals and nursing note reviewed.  66 year old male, resting comfortably and in no acute distress. Vital signs are significant for slow heart rate. Oxygen saturation is 98%, which is normal. Head is normocephalic and atraumatic. PERRLA, EOMI. There is mild erythema and minimal swelling along the right side of the jaw with no tenderness and no fluctuance. Neck is nontender and supple without adenopathy or JVD. Back is nontender and there is no CVA tenderness. Lungs are clear without rales, wheezes, or rhonchi. Chest is nontender. Heart is bradycardic without murmur. Abdomen is soft, flat, nontender. Extremities have no cyanosis or edema, full range of motion is present. Skin is warm and dry without rash. Neurologic: Awake and alert but minimally conversant, cranial nerves are intact.  Flat affect.  ED Results / Procedures / Treatments   Labs (all labs ordered are listed, but only abnormal results are displayed) Labs Reviewed  BASIC METABOLIC PANEL - Abnormal; Notable for the following components:      Result Value   Glucose, Bld 100 (*)    All other components within normal limits  CBC WITH DIFFERENTIAL/PLATELET   LACTIC ACID, PLASMA  MAGNESIUM  TROPONIN I (HIGH SENSITIVITY)    EKG EKG Interpretation  Date/Time:  Monday March 13 2022 00:08:57 EDT Ventricular Rate:  40 PR Interval:  208 QRS Duration: 136 QT Interval:  505 QTC Calculation: 412 R Axis:   -80 Text Interpretation: Complete (3-degree) AV block Left anterior fasicular block Ventricular trigeminy When compared with ECG of 04/28/2020 Complete (3-degree) AV block is now present Premature ventricular complexes are now present Confirmed by 04/30/2020 (Dione Booze) on 03/13/2022 12:37:00 AM  Radiology DG Chest Port 1 View  Result Date: 03/13/2022 CLINICAL DATA:  Initial evaluation for shortness of breath. EXAM: PORTABLE CHEST 1 VIEW COMPARISON:  Radiograph from  12/19/2013. FINDINGS: Median sternotomy wires with underlying surgical clips and valvular prosthesis. Cardiomegaly. Mediastinal silhouette normal. Lungs are hypoinflated. Diffuse vascular prominence consistent with mild pulmonary interstitial congestion/edema. No visible pleural effusion. No focal infiltrates. No pneumothorax. No acute osseous finding. IMPRESSION: Cardiomegaly with mild diffuse pulmonary interstitial congestion/edema. Electronically Signed   By: Rise Mu M.D.   On: 03/13/2022 00:50    Procedures Procedures  Cardiac monitor shows third-degree heart block, per my interpretation.  Medications Ordered in ED Medications - No data to display  ED Course/ Medical Decision Making/ A&P                           Medical Decision Making Amount and/or Complexity of Data Reviewed Labs: ordered. Radiology: ordered.   Cough, dyspnea, third-degree heart block.  Wheezing has resolved, he is not wheezing while here.  Third-degree heart block is presumably new.  However, in the setting of hospice care and DNR, family is not interested in pursuing pacemaker.  He seems to be tolerating slow heart rate very well.  I have reviewed the last prior ECG on record which was on  04/28/2020, and at that time he had a nonspecific intraventricular conduction delay and left anterior fascicular block.  I have ordered blood work including CBC, basic metabolic panel, magnesium level, troponin, lactic acid level.  We will check chest x-ray to rule out pneumonia.  If no electrolyte abnormality that is correctable, there will not be any treatment for his heart block.  His parotid gland infection does seem to be responding appropriately to antibiotics.  Family does state that if work-up shows that he would benefit from IV antibiotics, that they would want him admitted to the hospital.  Also, on review of old records, he had an cardiology evaluation on 03/18/2018 which mention left anterior fascicular block on ECG, also right bundle branch block.  I have reviewed that ECG, and I do not feel that it actually demonstrates right bundle branch block.  There is no mention of second or third-degree heart block.  I have reviewed and interpreted all of the laboratory tests, and they are normal including troponin, magnesium, lactic acid.  Chest x-ray shows no evidence of pneumonia.  I have independently viewed the image, and agree with the radiologist's interpretation.  I have independently viewed and interpreted the ECG, and it shows third-degree heart block, consistent with what was seen on cardiac monitor.  Shared decision making was done with patient's family.  There is no obvious intervention is likely to help his heart block, the only treatment would be pacemaker.  They do not wish to proceed with pacemaker, so decision is made to discharge him.  He is to continue his antibiotic for parotitis, since the infection seems to be responding appropriately.  Advised to return to the emergency department for any new or concerning symptoms.  Final Clinical Impression(s) / ED Diagnoses Final diagnoses:  Complete heart block (HCC)  Parotitis, acute    Rx / DC Orders ED Discharge Orders     None          Dione Booze, MD 03/13/22 0147

## 2022-03-12 NOTE — ED Provider Notes (Incomplete)
[facility]   CSN: [id number] Arrival date & time: 03/12/22  2324     History {Add pertinent medical, surgical, social history, OB history to HPI:1} Chief Complaint  Patient presents with  . Jaw Infection    Jeffery [name] is a 66 y.o. male.  The history is provided by the spouse and the patient.  He has history of hypertension, hyperlipidemia, coronary artery disease, aortic valve replacement, heart failure and currently in hospice and is DNR.  He started having some difficulty breathing and wheezing this afternoon and wife noted that heart rate was very slow.  Normal heart rate had been 48-50 prior to today.  He eventually was convinced to wear oxygen and he seems better since then.  There has been no vomiting and no diarrhea and no known fever.  He has been on amoxicillin-clavulanic acid for the last 3 days to treat a right parotid gland infection.  That infection seems to be improving.   Home Medications Prior to Admission medications   Medication Sig Start Date End Date Taking? Authorizing Provider  acetaminophen (TYLENOL) 325 MG tablet Take 650 mg by mouth every 6 (six) hours as needed.    [provider]  aspirin 81 MG EC tablet Take 81 mg by mouth daily. Swallow whole. 1 tablet by mouth daily    [provider]  benazepril (LOTENSIN) 10 MG tablet Take 1 tablet (10 mg total) by mouth every evening. 05/27/20   Ngetich, Dinah C, NP  bisacodyl (FLEET) 10 MG/30ML ENEM Place 30 mLs (10 mg total) rectally as needed. 05/27/20   Ngetich, Dinah C, NP  carbidopa-levodopa (SINEMET IR) 25-100 MG tablet Take 2 tablets by mouth 3 (three) times daily. 05/27/20   Ngetich, Dinah C, NP  cetirizine (ZYRTEC) 10 MG tablet Take 10 mg by mouth daily.    [provider]  co-enzyme Q-10 30 MG capsule Take 1 capsule (30 mg total) by mouth at bedtime. 05/27/20   Ngetich, Dinah C, NP  DHA-EPA-VITAMIN E PO Take 1 tablet by mouth every  evening.     [provider]  DULoxetine (CYMBALTA) 30 MG capsule Take 1 capsule (30 mg total) by mouth every evening. 05/27/20   Ngetich, Dinah C, NP  DULoxetine (CYMBALTA) 60 MG capsule Take 1 capsule (60 mg total) by mouth every morning. 05/27/20   Ngetich, Dinah C, NP  gabapentin (NEURONTIN) 600 MG tablet Take 1 tablet (600 mg total) by mouth 2 (two) times daily. 05/27/20   Ngetich, Dinah C, NP  glucosamine-chondroitin 500-400 MG tablet Take 1 tablet by mouth 2 (two) times daily. 05/27/20   Ngetich, Dinah C, NP  hydrochlorothiazide (HYDRODIURIL) 12.5 MG tablet Take 1 tablet (12.5 mg total) by mouth daily. 05/27/20   Ngetich, Dinah C, NP  HYDROcodone-acetaminophen (NORCO/VICODIN) 5-325 MG tablet Take 1 tablet by mouth every 8 (eight) hours as needed for moderate pain. 05/27/20   Ngetich, Dinah C, NP  Magnesium Hydroxide (MILK OF MAGNESIA PO) Take 30 mLs by mouth.    [provider]  melatonin 5 MG TABS Take 5 mg by mouth at bedtime.    [provider]  meloxicam (MOBIC) 7.5 MG tablet Take 1 tablet (7.5 mg total) by mouth 2 (two) times daily. 05/27/20   Ngetich, Dinah C, NP  mirabegron ER (MYRBETRIQ) 25 MG TB24 tablet Take 1 tablet (25 mg total) by mouth daily. 05/27/20   Ngetich, Dinah C, NP  Multiple Vitamins-Minerals (MENS MULTIVITAMIN PLUS PO) Take 1  tablet by mouth daily.    [provider]  nystatin (MYCOSTATIN) 100000 UNIT/ML suspension Take 5 mLs (500,000 Units total) by mouth 4 (four) times daily. 05/27/20   Ngetich, Dinah C, NP  Omega-3 Fatty Acids (FISH OIL) 500 MG CAPS Take 1 capsule by mouth daily.     [provider]  ondansetron (ZOFRAN-ODT) 8 MG disintegrating tablet Take 1 tablet (8 mg total) by mouth with breakfast, with lunch, and with evening meal. 05/27/20   Ngetich, Dinah C, NP  pantoprazole (PROTONIX) 40 MG tablet Take 1 tablet (40 mg total) by mouth daily at 12 noon. 05/27/20   Ngetich, Dinah C, NP  senna-docusate (SENOKOT-S) 8.6-50 MG  tablet Take 1 tablet by mouth 2 (two) times daily. 05/27/20   Ngetich, Dinah C, NP  simvastatin (ZOCOR) 20 MG tablet TAKE 1 TABLET BY MOUTH EVERY DAY AT 6PM 05/27/20   Ngetich, Dinah C, NP  Sodium Phosphates (RA SALINE ENEMA RE) Place 1 suppository rectally as needed.    [provider]  Specialty Vitamins Products (PROSTATE PO) Take 1 tablet by mouth daily.    [provider]  tiZANidine (ZANAFLEX) 4 MG tablet Take 1 tablet (4 mg total) by mouth 2 (two) times daily. 05/27/20   Ngetich, Nelda Bucks, NP      Allergies    Patient has no known allergies.    Review of Systems   Review of Systems  All other systems reviewed and are negative.  Physical Exam Updated Vital Signs BP 131/80 (BP Location: Right Arm)   Pulse 78   Temp 98.3 F (36.8 C) (Oral)   Resp 12   Ht 5\' 10"  (1.778 m)   SpO2 98%   BMI 28.41 kg/m  Physical Exam Vitals and nursing note reviewed.  66 year old male, resting comfortably and in no acute distress. Vital signs are significant for slow heart rate. Oxygen saturation is 98%, which is normal. Head is normocephalic and atraumatic. PERRLA, EOMI. There is mild erythema and minimal swelling along the right side of the jaw with no tenderness and no fluctuance. Neck is nontender and supple without adenopathy or JVD. Back is nontender and there is no CVA tenderness. Lungs are clear without rales, wheezes, or rhonchi. Chest is nontender. Heart is bradycardic without murmur. Abdomen is soft, flat, nontender. Extremities have no cyanosis or edema, full range of motion is present. Skin is warm and dry without rash. Neurologic: Awake and alert but minimally conversant, cranial nerves are intact.  Flat affect.  ED Results / Procedures / Treatments   Labs (all labs ordered are listed, but only abnormal results are displayed) Labs Reviewed  BASIC METABOLIC PANEL  CBC WITH DIFFERENTIAL/PLATELET  LACTIC ACID, PLASMA  MAGNESIUM  TROPONIN I (HIGH SENSITIVITY)     EKG None  Radiology No results found.  Procedures Procedures  Cardiac monitor shows third-degree heart block, per my interpretation.  Medications Ordered in ED Medications - No data to display  ED Course/ Medical Decision Making/ A&P                           Medical Decision Making Amount and/or Complexity of Data Reviewed Labs: ordered. Radiology: ordered.   Cough, dyspnea, third-degree heart block.  Wheezing has resolved, he is not wheezing while here.  Third-degree heart block is presumably new  {Document critical care time when appropriate:1} {Document review of labs and clinical decision tools ie heart score, Chads2Vasc2 etc:1}  {Document your  independent review of radiology images, and any outside records:1} {Document your discussion with family members, caretakers, and with consultants:1} {Document social determinants of health affecting pt's care:1} {Document your decision making why or why not admission, treatments were needed:1} Final Clinical Impression(s) / ED Diagnoses Final diagnoses:  None    Rx / DC Orders ED Discharge Orders     None

## 2022-03-12 NOTE — ED Triage Notes (Signed)
Pt BIB EMS from home for right jaw infection. Pt is hospice pt under authora care. Pt infection was being treated with amoxicillin but its not working. Bradycardic w/ HR of 30. Per EMS pt does not want any advanced treatments.  Hospice had pt on 3L O2 since pt had SOB for a couple days.  BP 130/70 HR 30 w/ frequent PVCs and prolonged PR intervals 98% on 3L

## 2022-03-13 ENCOUNTER — Emergency Department (HOSPITAL_COMMUNITY)

## 2022-03-13 DIAGNOSIS — J811 Chronic pulmonary edema: Secondary | ICD-10-CM | POA: Diagnosis not present

## 2022-03-13 DIAGNOSIS — R0989 Other specified symptoms and signs involving the circulatory and respiratory systems: Secondary | ICD-10-CM | POA: Diagnosis not present

## 2022-03-13 DIAGNOSIS — I442 Atrioventricular block, complete: Secondary | ICD-10-CM | POA: Diagnosis not present

## 2022-03-13 DIAGNOSIS — I517 Cardiomegaly: Secondary | ICD-10-CM | POA: Diagnosis not present

## 2022-03-13 LAB — CBC WITH DIFFERENTIAL/PLATELET
Abs Immature Granulocytes: 0.02 10*3/uL (ref 0.00–0.07)
Basophils Absolute: 0.1 10*3/uL (ref 0.0–0.1)
Basophils Relative: 1 %
Eosinophils Absolute: 0.4 10*3/uL (ref 0.0–0.5)
Eosinophils Relative: 6 %
HCT: 44.4 % (ref 39.0–52.0)
Hemoglobin: 14.9 g/dL (ref 13.0–17.0)
Immature Granulocytes: 0 %
Lymphocytes Relative: 36 %
Lymphs Abs: 2.4 10*3/uL (ref 0.7–4.0)
MCH: 30.6 pg (ref 26.0–34.0)
MCHC: 33.6 g/dL (ref 30.0–36.0)
MCV: 91.2 fL (ref 80.0–100.0)
Monocytes Absolute: 0.6 10*3/uL (ref 0.1–1.0)
Monocytes Relative: 9 %
Neutro Abs: 3.2 10*3/uL (ref 1.7–7.7)
Neutrophils Relative %: 48 %
Platelets: 201 10*3/uL (ref 150–400)
RBC: 4.87 MIL/uL (ref 4.22–5.81)
RDW: 12.6 % (ref 11.5–15.5)
WBC: 6.7 10*3/uL (ref 4.0–10.5)
nRBC: 0 % (ref 0.0–0.2)

## 2022-03-13 LAB — BASIC METABOLIC PANEL
Anion gap: 5 (ref 5–15)
BUN: 14 mg/dL (ref 8–23)
CO2: 29 mmol/L (ref 22–32)
Calcium: 9.6 mg/dL (ref 8.9–10.3)
Chloride: 107 mmol/L (ref 98–111)
Creatinine, Ser: 0.74 mg/dL (ref 0.61–1.24)
GFR, Estimated: >60 mL/min (ref >60)
Glucose, Bld: 100 mg/dL — ABNORMAL HIGH (ref 70–99)
Potassium: 4.5 mmol/L (ref 3.5–5.1)
Sodium: 141 mmol/L (ref 135–145)

## 2022-03-13 LAB — LACTIC ACID, PLASMA: Lactic Acid, Venous: 1.4 mmol/L (ref 0.5–1.9)

## 2022-03-13 LAB — MAGNESIUM: Magnesium: 2.2 mg/dL (ref 1.7–2.4)

## 2022-03-13 LAB — TROPONIN I (HIGH SENSITIVITY): Troponin I (High Sensitivity): 5 ng/L (ref <18)

## 2022-03-13 NOTE — Discharge Instructions (Addendum)
Continue taking the antibiotic until it is completed.  Your evaluation did not show any treatment which is likely to help your heart block.  The only treatment that would be helpful would be a pacemaker.  Feel free to return to the emergency department if you are having any problems.  Otherwise, continue to work with your hospice team.

## 2022-03-13 NOTE — ED Notes (Signed)
Pt transportation arranged to go home.

## 2022-03-13 NOTE — ED Notes (Signed)
[facility] on unit to transport pt home

## 2022-06-09 DEATH — deceased
